# Patient Record
Sex: Female | Born: 1945
Health system: Southern US, Community
[De-identification: ages and names within clinical notes are randomized; demographics above are authoritative.]

## PROBLEM LIST (undated history)

## (undated) DIAGNOSIS — M1990 Unspecified osteoarthritis, unspecified site: Secondary | ICD-10-CM

## (undated) DIAGNOSIS — K219 Gastro-esophageal reflux disease without esophagitis: Secondary | ICD-10-CM

## (undated) DIAGNOSIS — R0989 Other specified symptoms and signs involving the circulatory and respiratory systems: Secondary | ICD-10-CM

## (undated) DIAGNOSIS — K7581 Nonalcoholic steatohepatitis (NASH): Secondary | ICD-10-CM

## (undated) DIAGNOSIS — E559 Vitamin D deficiency, unspecified: Secondary | ICD-10-CM

## (undated) DIAGNOSIS — R7303 Prediabetes: Secondary | ICD-10-CM

## (undated) DIAGNOSIS — T4145XA Adverse effect of unspecified anesthetic, initial encounter: Secondary | ICD-10-CM

## (undated) DIAGNOSIS — E785 Hyperlipidemia, unspecified: Secondary | ICD-10-CM

## (undated) DIAGNOSIS — T8859XA Other complications of anesthesia, initial encounter: Secondary | ICD-10-CM

## (undated) DIAGNOSIS — D649 Anemia, unspecified: Secondary | ICD-10-CM

## (undated) HISTORY — DX: Prediabetes: R73.03

## (undated) HISTORY — PX: OTHER SURGICAL HISTORY: SHX169

## (undated) HISTORY — DX: Gastro-esophageal reflux disease without esophagitis: K21.9

## (undated) HISTORY — DX: Other specified symptoms and signs involving the circulatory and respiratory systems: R09.89

## (undated) HISTORY — DX: Hyperlipidemia, unspecified: E78.5

## (undated) HISTORY — DX: Nonalcoholic steatohepatitis (NASH): K75.81

## (undated) HISTORY — DX: Vitamin D deficiency, unspecified: E55.9

## (undated) HISTORY — PX: ABDOMINAL HYSTERECTOMY: SHX81

---

## 1998-05-30 ENCOUNTER — Ambulatory Visit (HOSPITAL_COMMUNITY): Admission: RE | Admit: 1998-05-30 | Discharge: 1998-05-30 | Payer: Self-pay | Admitting: Psychiatry

## 1998-06-14 ENCOUNTER — Other Ambulatory Visit: Admission: RE | Admit: 1998-06-14 | Discharge: 1998-06-14 | Payer: Self-pay | Admitting: *Deleted

## 1999-01-20 ENCOUNTER — Encounter: Admission: RE | Admit: 1999-01-20 | Discharge: 1999-04-20 | Payer: Self-pay | Admitting: Psychiatry

## 1999-07-11 ENCOUNTER — Other Ambulatory Visit: Admission: RE | Admit: 1999-07-11 | Discharge: 1999-07-11 | Payer: Self-pay | Admitting: *Deleted

## 1999-11-03 ENCOUNTER — Encounter (INDEPENDENT_AMBULATORY_CARE_PROVIDER_SITE_OTHER): Payer: Self-pay | Admitting: Specialist

## 1999-11-03 ENCOUNTER — Other Ambulatory Visit: Admission: RE | Admit: 1999-11-03 | Discharge: 1999-11-03 | Payer: Self-pay | Admitting: *Deleted

## 2000-01-21 ENCOUNTER — Emergency Department (HOSPITAL_COMMUNITY): Admission: EM | Admit: 2000-01-21 | Discharge: 2000-01-21 | Payer: Self-pay | Admitting: Emergency Medicine

## 2000-03-04 ENCOUNTER — Encounter: Admission: RE | Admit: 2000-03-04 | Discharge: 2000-03-04 | Payer: Self-pay | Admitting: *Deleted

## 2000-03-04 ENCOUNTER — Encounter: Payer: Self-pay | Admitting: *Deleted

## 2000-03-07 ENCOUNTER — Encounter: Admission: RE | Admit: 2000-03-07 | Discharge: 2000-03-07 | Payer: Self-pay | Admitting: Orthopedic Surgery

## 2000-03-07 ENCOUNTER — Encounter: Payer: Self-pay | Admitting: Orthopedic Surgery

## 2000-07-24 ENCOUNTER — Other Ambulatory Visit: Admission: RE | Admit: 2000-07-24 | Discharge: 2000-07-24 | Payer: Self-pay | Admitting: *Deleted

## 2001-03-05 ENCOUNTER — Encounter: Payer: Self-pay | Admitting: *Deleted

## 2001-03-05 ENCOUNTER — Encounter: Admission: RE | Admit: 2001-03-05 | Discharge: 2001-03-05 | Payer: Self-pay | Admitting: *Deleted

## 2001-04-16 ENCOUNTER — Encounter: Admission: RE | Admit: 2001-04-16 | Discharge: 2001-04-16 | Payer: Self-pay | Admitting: Gastroenterology

## 2001-04-16 ENCOUNTER — Encounter: Payer: Self-pay | Admitting: Gastroenterology

## 2001-04-21 ENCOUNTER — Ambulatory Visit (HOSPITAL_COMMUNITY): Admission: RE | Admit: 2001-04-21 | Discharge: 2001-04-21 | Payer: Self-pay | Admitting: Gastroenterology

## 2002-03-09 ENCOUNTER — Encounter: Admission: RE | Admit: 2002-03-09 | Discharge: 2002-03-09 | Payer: Self-pay | Admitting: *Deleted

## 2002-03-09 ENCOUNTER — Encounter: Payer: Self-pay | Admitting: *Deleted

## 2002-03-11 ENCOUNTER — Other Ambulatory Visit: Admission: RE | Admit: 2002-03-11 | Discharge: 2002-03-11 | Payer: Self-pay | Admitting: *Deleted

## 2002-04-28 ENCOUNTER — Ambulatory Visit (HOSPITAL_COMMUNITY): Admission: RE | Admit: 2002-04-28 | Discharge: 2002-04-28 | Payer: Self-pay | Admitting: Internal Medicine

## 2002-04-28 ENCOUNTER — Encounter: Payer: Self-pay | Admitting: Internal Medicine

## 2002-05-07 ENCOUNTER — Ambulatory Visit (HOSPITAL_COMMUNITY): Admission: RE | Admit: 2002-05-07 | Discharge: 2002-05-07 | Payer: Self-pay | Admitting: Internal Medicine

## 2002-05-07 ENCOUNTER — Encounter: Payer: Self-pay | Admitting: Internal Medicine

## 2002-06-20 ENCOUNTER — Inpatient Hospital Stay (HOSPITAL_COMMUNITY): Admission: AD | Admit: 2002-06-20 | Discharge: 2002-06-20 | Payer: Self-pay | Admitting: *Deleted

## 2002-08-25 ENCOUNTER — Observation Stay (HOSPITAL_COMMUNITY): Admission: RE | Admit: 2002-08-25 | Discharge: 2002-08-26 | Payer: Self-pay | Admitting: *Deleted

## 2002-08-25 ENCOUNTER — Encounter (INDEPENDENT_AMBULATORY_CARE_PROVIDER_SITE_OTHER): Payer: Self-pay

## 2003-03-11 ENCOUNTER — Encounter: Payer: Self-pay | Admitting: *Deleted

## 2003-03-11 ENCOUNTER — Encounter: Admission: RE | Admit: 2003-03-11 | Discharge: 2003-03-11 | Payer: Self-pay | Admitting: *Deleted

## 2003-04-08 ENCOUNTER — Other Ambulatory Visit: Admission: RE | Admit: 2003-04-08 | Discharge: 2003-04-08 | Payer: Self-pay | Admitting: *Deleted

## 2004-03-13 ENCOUNTER — Encounter: Admission: RE | Admit: 2004-03-13 | Discharge: 2004-03-13 | Payer: Self-pay | Admitting: *Deleted

## 2004-05-23 ENCOUNTER — Inpatient Hospital Stay (HOSPITAL_COMMUNITY): Admission: EM | Admit: 2004-05-23 | Discharge: 2004-05-24 | Payer: Self-pay | Admitting: Emergency Medicine

## 2004-06-12 ENCOUNTER — Other Ambulatory Visit: Admission: RE | Admit: 2004-06-12 | Discharge: 2004-06-12 | Payer: Self-pay | Admitting: *Deleted

## 2005-04-06 ENCOUNTER — Ambulatory Visit (HOSPITAL_COMMUNITY): Admission: RE | Admit: 2005-04-06 | Discharge: 2005-04-06 | Payer: Self-pay | Admitting: Obstetrics and Gynecology

## 2005-04-10 ENCOUNTER — Ambulatory Visit (HOSPITAL_COMMUNITY): Admission: RE | Admit: 2005-04-10 | Discharge: 2005-04-10 | Payer: Self-pay | Admitting: Obstetrics and Gynecology

## 2005-04-13 ENCOUNTER — Encounter: Admission: RE | Admit: 2005-04-13 | Discharge: 2005-04-13 | Payer: Self-pay | Admitting: *Deleted

## 2005-07-24 ENCOUNTER — Other Ambulatory Visit: Admission: RE | Admit: 2005-07-24 | Discharge: 2005-07-24 | Payer: Self-pay | Admitting: *Deleted

## 2005-11-16 ENCOUNTER — Emergency Department (HOSPITAL_COMMUNITY): Admission: EM | Admit: 2005-11-16 | Discharge: 2005-11-17 | Payer: Self-pay | Admitting: Emergency Medicine

## 2005-12-17 HISTORY — PX: EYE SURGERY: SHX253

## 2006-04-15 ENCOUNTER — Encounter: Admission: RE | Admit: 2006-04-15 | Discharge: 2006-04-15 | Payer: Self-pay | Admitting: *Deleted

## 2006-08-28 ENCOUNTER — Other Ambulatory Visit: Admission: RE | Admit: 2006-08-28 | Discharge: 2006-08-28 | Payer: Self-pay | Admitting: *Deleted

## 2007-04-30 ENCOUNTER — Encounter: Admission: RE | Admit: 2007-04-30 | Discharge: 2007-04-30 | Payer: Self-pay | Admitting: *Deleted

## 2007-12-09 ENCOUNTER — Other Ambulatory Visit: Admission: RE | Admit: 2007-12-09 | Discharge: 2007-12-09 | Payer: Self-pay | Admitting: *Deleted

## 2008-05-03 ENCOUNTER — Encounter: Admission: RE | Admit: 2008-05-03 | Discharge: 2008-05-03 | Payer: Self-pay | Admitting: Gynecology

## 2008-09-27 ENCOUNTER — Ambulatory Visit (HOSPITAL_COMMUNITY): Admission: RE | Admit: 2008-09-27 | Discharge: 2008-09-27 | Payer: Self-pay | Admitting: Gynecology

## 2009-05-23 ENCOUNTER — Encounter: Admission: RE | Admit: 2009-05-23 | Discharge: 2009-05-23 | Payer: Self-pay | Admitting: Gynecology

## 2010-01-03 ENCOUNTER — Ambulatory Visit (HOSPITAL_COMMUNITY): Admission: RE | Admit: 2010-01-03 | Discharge: 2010-01-03 | Payer: Self-pay | Admitting: Internal Medicine

## 2010-05-24 ENCOUNTER — Encounter: Admission: RE | Admit: 2010-05-24 | Discharge: 2010-05-24 | Payer: Self-pay | Admitting: Gynecology

## 2010-05-25 ENCOUNTER — Ambulatory Visit (HOSPITAL_COMMUNITY): Admission: RE | Admit: 2010-05-25 | Discharge: 2010-05-25 | Payer: Self-pay | Admitting: Internal Medicine

## 2011-04-10 ENCOUNTER — Other Ambulatory Visit: Payer: Self-pay | Admitting: Orthopedic Surgery

## 2011-04-10 DIAGNOSIS — M25529 Pain in unspecified elbow: Secondary | ICD-10-CM

## 2011-04-16 ENCOUNTER — Ambulatory Visit
Admission: RE | Admit: 2011-04-16 | Discharge: 2011-04-16 | Disposition: A | Discharge status: Home / Self Care | Payer: BC Managed Care – PPO | Source: Ambulatory Visit | Attending: Orthopedic Surgery | Admitting: Orthopedic Surgery

## 2011-04-16 DIAGNOSIS — M25529 Pain in unspecified elbow: Secondary | ICD-10-CM

## 2011-05-04 NOTE — Op Note (Signed)
NAME:  Anita Blake, Anita Blake                 ACCOUNT NO.:  [id number]   MEDICAL RECORD NO.:  [id number]          PATIENT TYPE:  AMB   LOCATION:  [place]                           FACILITY:  [facility]   PHYSICIAN:  Michelle L. Grewal, M.D.DATE OF BIRTH:  Aug 03, 1946   DATE OF PROCEDURE:  04/06/2005  DATE OF DISCHARGE:                                 OPERATIVE REPORT   PREOPERATIVE DIAGNOSIS:  Left lower quadrant pain.   POSTOPERATIVE DIAGNOSIS:  Extensive pelvic adhesions.   PROCEDURE:  Diagnostic laparoscopy and lysis of adhesions.   SURGEON:  Michelle L. Helane Rima, M.D.   INTRAOPERATIVE CONSULTATION:  Edsel Petrin. Dalbert Batman, M.D.   ANESTHESIA:  General.   ESTIMATED BLOOD LOSS:  Minimal.   COMPLICATIONS:  None.   DESCRIPTION OF PROCEDURE:  The patient was taken to the operating room and  intubated in the standard fashion.  She was prepped and draped, and an in-  and-out catheter was used to empty the bladder.  A sponge stick was placed  in the vagina.   Attention was turned to the abdomen.  A small infraumbilical incision was  made, and the Veress needle was inserted with one attempt.  Pneumoperitoneum  was achieved to a maximum pressure of 15 mmHg.  The 11-mm trocar was  inserted without difficulty.  The laparoscope was introduced through the  trocar sheath, and the patient was placed in Trendelenburg position.  The  upper abdomen was unremarkable.  We then placed a secondary 5-mm suprapubic  trocar under direct visualization.  I did a thoroughly exploration of the  pelvis and abdomen.  First off, she had very extensive adhesions involving  the large and small bowel that covered basically the entire left pelvic  sidewall and vaginal cuff.  There was a significant amount of mesenteric fat  which also interfered with our visualization.  There was one band which was  fairly dense of small bowel adherent to the vagina cuff which was then  freed, with hemostasis.  This did help with some mobility.  We  then did a  thorough exploration and tried to look for ovaries; however, I was unable to  locate any ovarian tissue anywhere in the pelvis.  I did visualize the  ureter pretty well on the right side after lifting up the two loops of large  bowel that were stuck to the pelvic sidewall.  These adhesions were fairly  dense, and because of the mesenteric fat, it was very hard to visualize if  there was bowel on one side.  At this point, I paged Dr. Fanny Skates and  had a telephone consult.  I described the findings in the pelvis to Fanny Skates.  His recommendation was that because there is the possibility the  patient could have undiagnosed diverticulitis, to not lyse adhesions at this  point.  He recommended for her to have a GI consult with a colonoscopy.  If  that was negative, she could come back at another point when she had a  complete bowel prep for lysis of adhesions.  I do think that these adhesions  could be contributing to her left-sided pain because they are in the same  distribution of where she describes clinically the pain is.  At this point,  I also did not identify anything that looked like uterine tissue.  I will  obtain her operative note from her hysterectomy from 2003 because the  patient had told me she did not have her ovaries removed, but I am unable to  find anything that looks like ovary.  The pneumoperitoneum was released.  The trocars were removed.  A 3-0 Vicryl was used to close the incisions.  Local was infiltrated at each incision site.  All sponge, needle, and  instrument counts were correct x2.   The patient went to the recovery room in stable condition.      MLG/MEDQ  D:  04/06/2005  T:  04/06/2005  Job:  [id number]

## 2011-05-04 NOTE — Op Note (Signed)
NAME:  Anita Blake, Anita Blake                           ACCOUNT NO.:  [id number]   MEDICAL RECORD NO.:  [id number]                   PATIENT TYPE:  INP   LOCATION:  7915                                 FACILITY:  [facility]   PHYSICIAN:  Rodney A. Mortenson, M.D.           DATE OF BIRTH:  [birth year]   DATE OF PROCEDURE:  05/22/2004  DATE OF DISCHARGE:                                 OPERATIVE REPORT   PREOPERATIVE DIAGNOSIS:  Dislocation, right elbow, with fracture of the  radial head and complete avulsion of the lateral collateral ligament, right  elbow, and avulsion.   POSTOPERATIVE DIAGNOSIS:  Dislocation, right elbow, with fracture of the  radial head and complete avulsion of the lateral collateral ligament, right  elbow, and avulsion.   OPERATION:  Arthrotomy, removal of multiple bony loose fragments.  Reconstruction, lateral collateral ligament, right elbow.   ANESTHESIA:  General.   SURGEON:  Jeanie Cooks, M.D.   ASSISTANT:  Thomasenia Bottoms, P.A.-C.   PROCEDURE:  Patient was placed on the operative table in the supine position  with the pneumatic tourniquet about the right upper arm.  The right upper  extremity was prepped with Duraprep and draped in the usual manner. The arm  was exsanguinated with Esmarch.  The tourniquet was elevated.  An incision  was started proximally to the lateral epicondyle and carried distally over  the radial head to the subcutaneous border of the ulna.  The skin edges were  retracted.  The bleeders were coagulated.  Blunt dissection is deep to the  subcutaneous tissue.  The fascia had been split open, and on dissection with  a blunt scissor, a great deal of blood came out of the joint.  Obviously,  the joint had been disrupted.  This disrupted plane was then dissected  bluntly with scissors.  No sharp instruments were used.  Once this was  opened, it was seen that the lateral collateral ligament was totally avulsed  off the lateral epicondyle.  This gave  excellent access to the joint itself.  There are multiple bony fragments found anterior to the humerus and some  posterior.  This all removed.  Took a great deal of time spent removing all  bony fragments.  The elbow was unstable.  Excellent access was achieved.  The area was irrigated with copiously with saline solution and drained.  All  debris was removed.  There was about 25-30% of the radial head missing, and  this was basically, four fragments could not be reconstructed.  With the  elbow reduced, the radial head had full pronation and supination without  hindrance or obstruction.  It was elected to save the radial head at this  point and reconstruct the lateral collateral ligament in hopes that good  range of motion and function would be achieved without replacing the radial  head or resecting the radial head.  The posterior aspect of the capitulum  showed an area where there was some loss of articular cartilage.  The  articulation of the distal humerus to the _________ with a notch in the  ulna.  The posterior fossa appeared normal with no loose bodies or anemia in  this area.  A drill hole was placed through the lateral epicondyle.  A Mitek  anchor was inserted, and the lateral collateral ligament was reattached to  its anatomic footprint and sutured on snugly.  Excellent reconstruction of  the lateral collateral ligament was tapered.  Once this was done, the radial  head was quite stable in the capitulum.  Ethibond 2-0 was also used to close  the capsule in this area.  Excellent capsular closure with excellent  stability was achieved.  The lateral collateral ligament as noted above is  reconstructed in anatomic position.  The subcutaneous tissues were then  closed with 2-0 Vicryl, and staples were used to close the skin.  Sterile  dressing and a posterior plaster splint with the elbow flexed about 80  degrees was applied.  The tourniquet was dropped prior to closure.  Excellent  reconstruction was achieved with good stability achieved.   DRAINS:  None.   COMPLICATIONS:  None.                                               Rodney A. Alphonzo Cruise, M.D.    RAM/MEDQ  D:  05/23/2004  T:  05/23/2004  Job:  [id number]

## 2011-05-04 NOTE — Op Note (Signed)
Anita Blake, Anita Blake                          ACCOUNT NO.:  [id number]   MEDICAL RECORD NO.:  [id number]                   PATIENT TYPE:  OBV   LOCATION:  9476                                 FACILITY:  [facility]   PHYSICIAN:  Doran Heater. Fore, M.D.                DATE OF BIRTH:  November 07, 1946   DATE OF PROCEDURE:  08/25/2002  DATE OF DISCHARGE:                                 OPERATIVE REPORT   PREOPERATIVE DIAGNOSES:  1. Enlarging leiomyomata uteri.  2. Pelvic pain.  3. Abnormal uterine bleeding.  4. Anemia secondary to above.   POSTOPERATIVE DIAGNOSES:  1. Enlarging leiomyomata uteri.  2. Pelvic pain.  3. Abnormal uterine bleeding.  4. Anemia secondary to above, pending pathology.   OPERATIVE PROCEDURE:  Abdominal cervical hysterectomy.   SURGEON:  Doran Heater. Warnell Forester, M.D.   ASSISTANT:  Calvert Cantor, M.D.   ANESTHESIA:  General orotracheal.   INDICATIONS:  The patient is a 65 year old with the above noted problems who  is counseled as to the need for surgery and the type of surgery to be  performed. She was fully counseled as to the nature of the procedure and the  risks involved to include risk of anesthesia, injury to bowel, bladder,  blood vessels, ureters, postoperative hemorrhage, infection, recuperation  and possibility of hormone replacement should her ovaries be removed. She  fully understands all these considerations and wishes to proceed on  August 25, 2002.   OPERATIVE FINDINGS:  On attempt to effect [place] into the uterus, it was noted  that the uterus would not descend and that the cervix was quite high in the  vagina without any relaxation when the patient was under general anesthesia.  Accordingly, an abdominal procedure was performed. On entry into the  abdomen, the uterus was approximately 10-[redacted] weeks gestational size with  subserous myomata present. The ovaries appeared normal as did the tubes. The  upper abdominal viscera were normal to palpation.   DESCRIPTION OF PROCEDURE:  With the patient under general anesthesia,  prepared and draped in the usual sterile fashion, a speculum was placed in  the vagina. An attempt was made to effect descent of the uterus which was  unsuccessful. Accordingly, the patient was catheterized with a Foley  catheter and reprepared and redraped for an abdominal procedure.   A lower abdominal transverse incision was made through a previous surgical  scar and carried into the peritoneal cavity without difficulty. A self-  retaining retractor was placed and the bowel was packed off. The uterus was  elevated using Kelly clamps over the tubes and the round ligaments and  uterine ovarian anastomoses bilaterally. The round ligaments were then  transected using Bovie electrocoagulation with development of a bladder flap  anteriorly and skeletonization of the uterine vessels. The retroperitoneal  space was likewise entered and the infundibulopelvic ligaments were  identified with the ureters well below the plane  of dissection. Because of  the normal appearance of the ovaries and the patient's desire to retain the  ovaries should they appear normal, Heaney clamps were placed proximally to  the ovary bilaterally, The uterine ovarian anastomosis was then transected  and doubly ligated with #1 chromic catgut. The uterine vessels were  skeletonized bilaterally, clamped, cut, and suture ligated with #1 chromic  catgut. Straight Heaney clamps were used to clamp the remaining portions of  the cardinal ligaments bilaterally; these structures were then cut free and  suture ligated with #1 chromic catgut. The uterine fundus was then excised  using Bovie electrocoagulation. The cervix was rendered hemostatic and the  endocervical tissue was destroyed using Bovie electrocoagulation.  Interrupted figure of eight sutures were placed across the cervix for  hemostasis and reapproximation of the cervical stump. Small bleeders were   rendered hemostatic with Bovie electrocoagulation. After noting that  hemostasis was maintained in the surgical site, the area was  reperitonealized with interrupted suture of #1 chromic catgut. The ovarian  pedicles were then suspended from the round ligaments bilaterally with  interrupted sutures of #1 chromic catgut. Again after noting that hemostasis  was maintained and that the sponge and instruments were correct, the  peritoneum was closed with continuous suture of #0 Vicryl. The fascia was  closed with two sutures of #0 Vicryl which were brought from the lateral  aspects and tied separately in the midline. The subcutaneous fat was  reapproximated with interrupted sutures of #1 chromic catgut. The skin was  closed with a subcuticular suture of 3-0 plain catgut. The estimated blood  loss was 250 mL. The patient was taken to the recovery room in good  condition with clear urine in the Foley catheter tubing. She will be placed  on 23-hour observation following surgery.                                                 Doran Heater. Warnell Forester, M.D.    SRF/MEDQ  D:  08/25/2002  T:  08/25/2002  Job:  [id number]   cc:   Calvert Cantor, M.D.  [address] [place]  [place] [address]  Fax: [phone]

## 2011-05-04 NOTE — Discharge Summary (Signed)
   NAME:  Anita Blake, Anita Blake                           ACCOUNT NO.:  [id number]   MEDICAL RECORD NO.:  [id number]                   PATIENT TYPE:  OBV   LOCATION:  5456                                 FACILITY:  [facility]   PHYSICIAN:  Doran Heater. Fore, M.D.                DATE OF BIRTH:  [birth year]   DATE OF ADMISSION:  08/25/2002  DATE OF DISCHARGE:  08/26/2002                                 DISCHARGE SUMMARY   HISTORY:  The patient is a 65 year old with status post myomectomy with  increasing size of uterus, abnormal uterine bleeding, and anemia for  hysterectomy.  The remainder of her history and physical are as previously  dictated.   LABORATORY DATA:  Preoperative hemoglobin 11.5.  Electrocardiogram was  normal.   HOSPITAL COURSE:  The patient was taken to the operating room on September 9  at which time an abdominal supracervical hysterectomy was performed.  The  patient did well postoperatively.  Diet and ambulation were progressed over  the evening of September 9 and early morning of September 10.  On the  morning of September 10 she was afebrile and experiencing no problems except  for pain which was relieved by medications.  It was felt that she could be  discharged at this time.   FINAL DIAGNOSES:  Uterine fibroids, enlarging, pelvic pain, abnormal uterine  bleeding, anemia secondary to above.   OPERATION:  Abdominal supracervical hysterectomy.   PATHOLOGY:  Report unavailable at the time of dictation.   DISPOSITION:  Discharged home to return to the office in two weeks for  follow-up.  She was fully ambulatory, on a regular diet, and in good  condition at the time of discharge.  She was given prescriptions for  Dilaudid or generic 2 mg number 30 to be taken one to two q.4h. p.r.n. pain  and doxycycline 100 mg number 12 to be taken one b.i.d.  She was also  advised to use ibuprofen 400 mg t.i.d. for approximately one week.  She will  call for any problems.                                   Doran Heater. Warnell Forester, M.D.    SRF/MEDQ  D:  08/26/2002  T:  08/26/2002  Job:  [id number]   cc:   Mayme Genta, M.D.

## 2011-05-04 NOTE — H&P (Signed)
NAME:  Anita Blake, Anita Blake                           ACCOUNT NO.:  [id number]   MEDICAL RECORD NO.:  [id number]                   PATIENT TYPE:  MAT   LOCATION:  MATC                                 FACILITY:  [facility]   PHYSICIAN:  Doran Heater. Fore, M.D.                DATE OF BIRTH:  [birth year]   DATE OF ADMISSION:  08/25/2002  DATE OF DISCHARGE:  06/20/2002                                HISTORY & PHYSICAL   CHIEF COMPLAINT:  Abnormal bleeding, uterine enlargement.   HISTORY OF PRESENT ILLNESS:  The patient is a 65 year old who has been  followed in our office over several years.  She has had increasing uterine  size over the past several years to the point where the uterus is now  approximately 12 to [redacted] weeks gestational size and suddenly irregular.  The  patient has also experienced abnormal bleeding with a drop in her hemoglobin  such that a recent hemoglobin approximately a month ago was 10.9.  She has  been maintained on extra iron with some improvement.  She underwent  hysteroscopy and D&C in mid 6/03 with benign findings.  Pap smear was normal  in 3/03.  She is admitted at this time for hysterectomy, possible bilateral  salpingo-oophorectomy.  She has been fully counseled as to the nature of  these procedures and the risks involved which include risk of anesthesia,  injury to bowel, bladder, blood vessels, ureters, postoperative hemorrhage,  infection, and recuperation.  She has also been counseled as to hormone  therapy following surgery.  She fully understands all of these  considerations, and wishes to proceed on 08/25/02.   PAST MEDICAL HISTORY:  1. Previous D&C and laparoscopy with ablation of a right ovary cyst and     lysis of adhesions in 6/98.  2. D&C in 11/00.  3. Most recent D&C in 6/03.  All of these pathology reports have shown only benign changes.   MEDICATIONS:  She takes no medications regularly except for analgesics.  She  has been on progesterone suppression for  improvement of bleeding.   ALLERGIES:  No known drug allergies.   FAMILY HISTORY:  Noncontributory.   REVIEW OF SYMPTOMS:  HEENT:  Wears glasses.  CARDIORESPIRATORY:  Negative.  GASTROINTESTINAL:  Negative.  GENITOURINARY:  As in present illness.  NEUROMUSCULAR:  Negative.   PHYSICAL EXAMINATION:  VITAL SIGNS:  Height 5 feet 3.5 inches, weight 233  pounds, blood pressure 142/78, pulse 72, respiratory rate 18.  GENERAL:  A well-developed black female in no acute distress.  HEENT:  Within normal limits.  NECK:  Supple without masses, adenopathy, or bruits.  HEART:  Regular rate and rhythm without murmurs.  LUNGS:  Clear to auscultation and percussion.  BREASTS:  Sitting and laying without mass.  Axilla negative.  ABDOMEN:  Soft, without mass or tenderness.  PELVIC:  External genitalia, Bartholin's, urethra, and Skene's glands within  normal limits.  Cervix slightly inflamed.  Uterus is mid position  approximately 12+ weeks gestational size, slightly irregular and somewhat  tender.  There are no palpable adnexal masses.  Anterior and posterior cul-  de-sac examination and rectovaginal examination are confirmatory.  EXTREMITIES:  Within normal limits.  CENTRAL NERVOUS SYSTEM:  Grossly intact.  SKIN:  Without suspicious lesions.   IMPRESSION:  1. Abnormal uterine bleeding.  2. Uterine enlargement.   DISPOSITION:  As noted above.                                                Doran Heater. Warnell Forester, M.D.    SRF/MEDQ  D:  08/20/2002  T:  08/20/2002  Job:  [id number]

## 2011-05-04 NOTE — Discharge Summary (Signed)
NAME:  Anita Blake, Anita Blake                           ACCOUNT NO.:  [id number]   MEDICAL RECORD NO.:  [id number]                   PATIENT TYPE:  INP   LOCATION:  8527                                 FACILITY:  [facility]   PHYSICIAN:  Rodney A. Alphonzo Cruise, M.D.           DATE OF BIRTH:  1946-07-05   DATE OF ADMISSION:  05/22/2004  DATE OF DISCHARGE:  05/24/2004                                 DISCHARGE SUMMARY   ADMISSION DIAGNOSIS:  Fractured radial head, right elbow.  Dislocated same, __________ elbow, with floating bony fragment in elbow  joint.   DISCHARGE DIAGNOSES:  1. Right elbow closed reduction with open fracture clean-out of bony     fragments.  2. Reconstruction of the lateral collateral ligament and arthrotomy.   HISTORY OF PRESENT ILLNESS:  The patient is a 65 year old female who  presents with right elbow pain after falling while trying to catch her dog.  The patient had significant discomfort in her left elbow.  She was  __________ emergency room where x-rays show a fracture dislocation of the  right elbow.  The patient was admitted for arthrotomy and bony fragment  removal and repair of right elbow.   CONSULTS:  None.   HOSPITAL COURSE:  On May 23, 2004, the patient was admitted to [facility] to the care of Dr. Gildardo Cranker.  The patient was evaluated in  the emergency room and found to have a fracture dislocation of the right  elbow, fracture of the radial head, avulsion of the LCL and bony fragments  within the joint.  The patient was evaluated, admitted and taken to the OR  for removal of bony fragments and arthrotomy and evaluation of the elbow  joint and reconstruction of the LCL under general anesthesia.  The patient tolerated this procedure well.  She was controlled with pain  medicine, both IV and oral, but she tolerated her pain well with just p.o.  pain medicines.  The patient had no postoperative untoward events.  Vital  signs remained stable.  Her  arm remained motor and vascularly intact and she  was discharged to home on postoperative day #1 with  a one-week followup  with Dr. Alphonzo Cruise.   LABORATORY DATA:  CBC on June 7 showed WBC 10.4, hemoglobin 12.6, hematocrit  38.2, platelets 267.  Routine chemistries showed sodium 138, potassium 4.1,  glucose 112, BUN 18, creatinine 0.7.   DISCHARGE MEDICATIONS:  1. The patient is to resume routine home medications  2. Vicodin 5 mg one or two tablets every four to six hours for pain if     needed.  3. Pain management with the Vicodin as noted above, ice and elevation.  4. Keep arm in sling.   ACTIVITY:  The patient have normal activities with her arm in sling at all  times.   DIET:  No restrictions.   WOUND CARE:  The patient is to keep  splint clean and dry.  If any concern or  problems with the right elbow, she is to call Dr. Josefa Half office for  advice.   FOLLOW UP:  The patient is to follow up with Dr. Alphonzo Cruise in his office in  one week.  Please call [phone] for followup appointment.   CONDITION ON DISCHARGE:  Improved and good.     Evert Kohl, P.A.                      Rodney A. Alphonzo Cruise, M.D.    RWK/MEDQ  D:  06/08/2004  T:  06/09/2004  Job:  [id number]

## 2011-05-15 ENCOUNTER — Other Ambulatory Visit: Payer: Self-pay | Admitting: Gynecology

## 2011-05-15 DIAGNOSIS — Z1231 Encounter for screening mammogram for malignant neoplasm of breast: Secondary | ICD-10-CM

## 2011-05-28 ENCOUNTER — Ambulatory Visit
Admission: RE | Admit: 2011-05-28 | Discharge: 2011-05-28 | Disposition: A | Discharge status: Home / Self Care | Payer: BC Managed Care – PPO | Source: Ambulatory Visit | Attending: Gynecology | Admitting: Gynecology

## 2011-05-28 ENCOUNTER — Ambulatory Visit: Payer: BC Managed Care – PPO

## 2011-05-28 DIAGNOSIS — Z1231 Encounter for screening mammogram for malignant neoplasm of breast: Secondary | ICD-10-CM

## 2011-10-12 ENCOUNTER — Other Ambulatory Visit (HOSPITAL_COMMUNITY): Payer: Self-pay | Admitting: Internal Medicine

## 2011-10-12 DIAGNOSIS — R1011 Right upper quadrant pain: Secondary | ICD-10-CM

## 2011-10-16 ENCOUNTER — Ambulatory Visit (HOSPITAL_COMMUNITY)
Admission: RE | Admit: 2011-10-16 | Discharge: 2011-10-16 | Disposition: A | Discharge status: Home / Self Care | Payer: BC Managed Care – PPO | Source: Ambulatory Visit | Attending: Internal Medicine | Admitting: Internal Medicine

## 2011-10-16 DIAGNOSIS — R1011 Right upper quadrant pain: Secondary | ICD-10-CM

## 2011-10-16 DIAGNOSIS — R7989 Other specified abnormal findings of blood chemistry: Secondary | ICD-10-CM | POA: Insufficient documentation

## 2011-10-23 ENCOUNTER — Other Ambulatory Visit: Payer: Self-pay

## 2011-10-25 DIAGNOSIS — H35349 Macular cyst, hole, or pseudohole, unspecified eye: Secondary | ICD-10-CM | POA: Insufficient documentation

## 2012-10-30 DIAGNOSIS — H269 Unspecified cataract: Secondary | ICD-10-CM | POA: Insufficient documentation

## 2013-09-29 ENCOUNTER — Encounter (HOSPITAL_COMMUNITY): Payer: Self-pay | Admitting: Pharmacy Technician

## 2013-09-30 ENCOUNTER — Other Ambulatory Visit (HOSPITAL_COMMUNITY): Payer: Self-pay | Admitting: Orthopedic Surgery

## 2013-09-30 NOTE — Progress Notes (Signed)
ekg 10-14-2012 dr Melford Aase on chart

## 2013-09-30 NOTE — Patient Instructions (Addendum)
[place]  09/30/2013   Your procedure is scheduled on: 10-06-2013  Report to [facility] at 725  AM.  Call this number if you have problems the morning of surgery [phone]   Remember:   Do not eat food or drink liquids :After Midnight.     Take these medicines the morning of surgery with A SIP OF WATER: no meds to take                                [place] may not have any metal on your body including hair pins and piercings  Do not wear jewelry, make-up.  Do not wear lotions, powders, or perfumes. You may wear deodorant.   Men may shave face and neck.  Do not bring valuables to the hospital. [place].  Contacts, dentures or bridgework may not be worn into surgery.  Leave suitcase in the car. After surgery it may be brought to your room.  For patients admitted to the hospital, checkout time is 11:00 AM the day of discharge.   Patients discharged the day of surgery will not be allowed to drive home.  Name and phone number of your driver:  Special Instructions: N/A   Please read over the following fact sheets that you were given: mrsa information, blood fact sheet, incentive spirometer sheet  Call Zelphia Cairo RN pre op nurse if needed [phone]    [place].  PATIENT SIGNATURE___________________________________________  NURSE SIGNATURE_____________________________________________  Pt's husband took message - surgery time changed to 9:15 am and Anita Blake should arrive to Short Stay by 6:30 am  - remind her that she is not to eat or drink anything after midnight night before surgery.

## 2013-10-01 ENCOUNTER — Encounter (INDEPENDENT_AMBULATORY_CARE_PROVIDER_SITE_OTHER): Payer: Self-pay

## 2013-10-01 ENCOUNTER — Encounter (HOSPITAL_COMMUNITY): Payer: Self-pay

## 2013-10-01 ENCOUNTER — Encounter (HOSPITAL_COMMUNITY)
Admission: RE | Admit: 2013-10-01 | Discharge: 2013-10-01 | Disposition: A | Discharge status: Home / Self Care | Payer: Medicare PPO | Source: Ambulatory Visit | Attending: Orthopedic Surgery | Admitting: Orthopedic Surgery

## 2013-10-01 ENCOUNTER — Encounter (HOSPITAL_COMMUNITY): Payer: Self-pay | Admitting: Pharmacy Technician

## 2013-10-01 DIAGNOSIS — Z01812 Encounter for preprocedural laboratory examination: Secondary | ICD-10-CM | POA: Insufficient documentation

## 2013-10-01 DIAGNOSIS — Z01818 Encounter for other preprocedural examination: Secondary | ICD-10-CM | POA: Insufficient documentation

## 2013-10-01 HISTORY — DX: Anemia, unspecified: D64.9

## 2013-10-01 HISTORY — DX: Adverse effect of unspecified anesthetic, initial encounter: T41.45XA

## 2013-10-01 HISTORY — DX: Other complications of anesthesia, initial encounter: T88.59XA

## 2013-10-01 HISTORY — DX: Unspecified osteoarthritis, unspecified site: M19.90

## 2013-10-01 LAB — CBC
Platelets: 292 10*3/uL (ref 150–400)
RBC: 4.85 MIL/uL (ref 3.87–5.11)
WBC: 6.9 10*3/uL (ref 4.0–10.5)

## 2013-10-01 LAB — BASIC METABOLIC PANEL
Calcium: 9.6 mg/dL (ref 8.4–10.5)
GFR calc Af Amer: 79 mL/min — ABNORMAL LOW (ref >90)
GFR calc non Af Amer: 68 mL/min — ABNORMAL LOW (ref >90)
Sodium: 138 mEq/L (ref 135–145)

## 2013-10-01 LAB — PROTIME-INR: Prothrombin Time: 13 seconds (ref 11.6–15.2)

## 2013-10-01 LAB — URINALYSIS, ROUTINE W REFLEX MICROSCOPIC
Bilirubin Urine: NEGATIVE
Hgb urine dipstick: NEGATIVE
Specific Gravity, Urine: 1.027 (ref 1.005–1.030)
Urobilinogen, UA: 0.2 mg/dL (ref 0.0–1.0)

## 2013-10-01 LAB — SURGICAL PCR SCREEN: Staphylococcus aureus: NEGATIVE

## 2013-10-01 LAB — APTT: aPTT: 30 seconds (ref 24–37)

## 2013-10-02 NOTE — Progress Notes (Signed)
Pt called at 0950 10/02/13 about surgery time change. Voicemail left on home phone. Will continue to try to reach patient.

## 2013-10-04 NOTE — H&P (Signed)
TOTAL HIP ADMISSION H&P  Patient is admitted for left total hip arthroplasty, anterior approach.  Subjective:  Chief Complaint:   Left hip OA / pain  HPI: Anita Blake, 67 y.o. female, has a history of pain and functional disability in the left hip(s) due to trauma and patient has failed non-surgical conservative treatments for greater than 12 weeks to include NSAID's and/or analgesics, corticosteriod injections, use of assistive devices and activity modification.  Onset of symptoms was gradual starting >10 years ago with gradually worsening course since that time.The patient noted no past surgery on the left hip(s).  Patient currently rates pain in the left hip at 9 out of 10 with activity. Patient has night pain, worsening of pain with activity and weight bearing, trendelenberg gait, pain that interfers with activities of daily living and pain with passive range of motion. Patient has evidence of periarticular osteophytes and joint space narrowing by imaging studies. This condition presents safety issues increasing the risk of falls. There is no current active infection.  Risks, benefits and expectations were discussed with the patient. Patient understand the risks, benefits and expectations and wishes to proceed with surgery.   D/C Plans:   Home with HHPT/SNF  Post-op Meds:   No Rx given  Tranexamic Acid:   To be given  Decadron:    To be given  FYI:    ASA post-op   Past Medical History  Diagnosis Date  . Arthritis   . Anemia yrs ago  . Complication of anesthesia     slow to wake up    Past Surgical History  Procedure Laterality Date  . Abdominal hysterectomy    . Eye surgery Left 2007    macular hole in retina  . Cataract surgery Left   . Right arm elbow surgery      ligament repair  . Index finger surgery Right     No prescriptions prior to admission   Allergies  Allergen Reactions  . Latex Rash    History  Substance Use Topics  . Smoking status: Never Smoker   .  Smokeless tobacco: Never Used  . Alcohol Use: No    No family history on file.   Review of Systems  Constitutional: Negative.   HENT: Negative.   Eyes: Negative.   Respiratory: Negative.   Cardiovascular: Negative.   Gastrointestinal: Negative.   Genitourinary: Negative.   Musculoskeletal: Positive for joint pain.  Skin: Negative.   Neurological: Negative.   Endo/Heme/Allergies: Negative.   Psychiatric/Behavioral: Negative.     Objective:  Physical Exam  Constitutional: She is oriented to person, place, and time. She appears well-developed and well-nourished.  HENT:  Head: Normocephalic and atraumatic.  Mouth/Throat: Oropharynx is clear and moist.  Eyes: Pupils are equal, round, and reactive to light.  Neck: Neck supple. No JVD present. No tracheal deviation present. No thyromegaly present.  Cardiovascular: Normal rate, regular rhythm, normal heart sounds and intact distal pulses.   Respiratory: Effort normal and breath sounds normal. No stridor. No respiratory distress. She has no wheezes.  GI: Soft. There is no tenderness. There is no guarding.  Musculoskeletal:       Left hip: She exhibits decreased range of motion, decreased strength, tenderness and bony tenderness. She exhibits no swelling, no deformity and no laceration.  Lymphadenopathy:    She has no cervical adenopathy.  Neurological: She is alert and oriented to person, place, and time.  Skin: Skin is warm and dry.  Psychiatric: She has a normal  mood and affect.     Imaging Review Plain radiographs demonstrate severe degenerative joint disease of the left hip(s). The bone quality appears to be good for age and reported activity level.  Assessment/Plan:  End stage arthritis, left hip(s)  The patient history, physical examination, clinical judgement of the provider and imaging studies are consistent with end stage degenerative joint disease of the left hip(s) and total hip arthroplasty is deemed medically  necessary. The treatment options including medical management, injection therapy, arthroscopy and arthroplasty were discussed at length. The risks and benefits of total hip arthroplasty were presented and reviewed. The risks due to aseptic loosening, infection, stiffness, dislocation/subluxation,  thromboembolic complications and other imponderables were discussed.  The patient acknowledged the explanation, agreed to proceed with the plan and consent was signed. Patient is being admitted for inpatient treatment for surgery, pain control, PT, OT, prophylactic antibiotics, VTE prophylaxis, progressive ambulation and ADL's and discharge planning.The patient is planning to be discharged to skilled nursing facility vs home.     [place] P[place] Damaria Stofko   PAC  10/04/2013, 8:19 PM

## 2013-10-05 NOTE — Progress Notes (Signed)
Spoke with patient- informed her to arrive 0530 AM, NPO after midnight

## 2013-10-05 NOTE — Progress Notes (Signed)
Patient informed to be in short stay at 0615am Tuesday-  Verbalizes understanding

## 2013-10-06 ENCOUNTER — Inpatient Hospital Stay (HOSPITAL_COMMUNITY): Payer: Medicare PPO | Admitting: Anesthesiology

## 2013-10-06 ENCOUNTER — Inpatient Hospital Stay (HOSPITAL_COMMUNITY): Payer: Medicare PPO

## 2013-10-06 ENCOUNTER — Encounter (HOSPITAL_COMMUNITY): Payer: Medicare PPO | Admitting: Anesthesiology

## 2013-10-06 ENCOUNTER — Encounter (HOSPITAL_COMMUNITY): Payer: Self-pay | Admitting: *Deleted

## 2013-10-06 ENCOUNTER — Encounter (HOSPITAL_COMMUNITY)
Admission: RE | Disposition: A | Discharge status: Home Health | Payer: Self-pay | Source: Ambulatory Visit | Attending: Orthopedic Surgery

## 2013-10-06 ENCOUNTER — Inpatient Hospital Stay (HOSPITAL_COMMUNITY)
Admission: RE | Admit: 2013-10-06 | Discharge: 2013-10-08 | DRG: 470 | Disposition: A | Discharge status: Home Health | Payer: Medicare PPO | Source: Ambulatory Visit | Attending: Orthopedic Surgery | Admitting: Orthopedic Surgery

## 2013-10-06 DIAGNOSIS — Z96649 Presence of unspecified artificial hip joint: Secondary | ICD-10-CM

## 2013-10-06 DIAGNOSIS — M1610 Unilateral primary osteoarthritis, unspecified hip: Secondary | ICD-10-CM | POA: Diagnosis present

## 2013-10-06 DIAGNOSIS — M169 Osteoarthritis of hip, unspecified: Secondary | ICD-10-CM | POA: Diagnosis present

## 2013-10-06 DIAGNOSIS — Z6841 Body Mass Index (BMI) 40.0 and over, adult: Secondary | ICD-10-CM | POA: Diagnosis not present

## 2013-10-06 DIAGNOSIS — R4182 Altered mental status, unspecified: Secondary | ICD-10-CM

## 2013-10-06 DIAGNOSIS — E66813 Obesity, class 3: Secondary | ICD-10-CM | POA: Diagnosis present

## 2013-10-06 DIAGNOSIS — D62 Acute posthemorrhagic anemia: Secondary | ICD-10-CM | POA: Diagnosis not present

## 2013-10-06 HISTORY — PX: TOTAL HIP ARTHROPLASTY: SHX124

## 2013-10-06 LAB — TYPE AND SCREEN
ABO/RH(D): O POS
Antibody Screen: NEGATIVE

## 2013-10-06 LAB — GLUCOSE, CAPILLARY: Glucose-Capillary: 102 mg/dL — ABNORMAL HIGH (ref 70–99)

## 2013-10-06 SURGERY — ARTHROPLASTY, HIP, TOTAL, ANTERIOR APPROACH
Anesthesia: General | Site: Hip | Laterality: Left | Wound class: Clean

## 2013-10-06 MED ORDER — MAGNESIUM 30 MG PO TABS: 30.0000 mg | ORAL_TABLET | Freq: Every day | ORAL | Status: DC

## 2013-10-06 MED ORDER — PROMETHAZINE HCL 25 MG/ML IJ SOLN: 6.2500 mg | INTRAMUSCULAR | Status: DC | PRN

## 2013-10-06 MED ORDER — HYDROMORPHONE HCL PF 1 MG/ML IJ SOLN: 0.5000 mg | INTRAMUSCULAR | Status: DC | PRN

## 2013-10-06 MED ORDER — HYDROCODONE-ACETAMINOPHEN 7.5-325 MG PO TABS
1.0000 | ORAL_TABLET | ORAL | Status: DC
  Administered 2013-10-06 – 2013-10-07 (×4): 1 via ORAL
  Filled 2013-10-06: qty 1
  Filled 2013-10-06 (×7): qty 2
  Filled 2013-10-06: qty 1
  Filled 2013-10-06 (×2): qty 2
  Filled 2013-10-06: qty 1
  Filled 2013-10-06 (×4): qty 2

## 2013-10-06 MED ORDER — CISATRACURIUM BESYLATE (PF) 10 MG/5ML IV SOLN
INTRAVENOUS | Status: DC | PRN
  Administered 2013-10-06: 10 mg via INTRAVENOUS
  Administered 2013-10-06: 2 mg via INTRAVENOUS

## 2013-10-06 MED ORDER — POLYETHYLENE GLYCOL 3350 17 G PO PACK
17.0000 g | PACK | Freq: Two times a day (BID) | ORAL | Status: DC
  Administered 2013-10-06 – 2013-10-08 (×4): 17 g via ORAL
  Filled 2013-10-06 (×3): qty 1

## 2013-10-06 MED ORDER — DEXAMETHASONE SODIUM PHOSPHATE 10 MG/ML IJ SOLN
10.0000 mg | Freq: Once | INTRAMUSCULAR | Status: AC
  Administered 2013-10-06: 10 mg via INTRAVENOUS

## 2013-10-06 MED ORDER — SUCCINYLCHOLINE CHLORIDE 20 MG/ML IJ SOLN
INTRAMUSCULAR | Status: DC | PRN
  Administered 2013-10-06: 140 mg via INTRAVENOUS

## 2013-10-06 MED ORDER — LACTATED RINGERS IV SOLN
INTRAVENOUS | Status: DC
  Administered 2013-10-06: 1000 mL via INTRAVENOUS

## 2013-10-06 MED ORDER — ASPIRIN EC 325 MG PO TBEC
325.0000 mg | DELAYED_RELEASE_TABLET | Freq: Two times a day (BID) | ORAL | Status: DC
Start: 2013-10-07 — End: 2013-10-08
  Administered 2013-10-07 – 2013-10-08 (×3): 325 mg via ORAL
  Filled 2013-10-06 (×5): qty 1

## 2013-10-06 MED ORDER — GLYCOPYRROLATE 0.2 MG/ML IJ SOLN
INTRAMUSCULAR | Status: DC | PRN
  Administered 2013-10-06: 0.4 mg via INTRAVENOUS

## 2013-10-06 MED ORDER — FLEET ENEMA 7-19 GM/118ML RE ENEM: 1.0000 | ENEMA | Freq: Once | RECTAL | Status: AC | PRN

## 2013-10-06 MED ORDER — CEFAZOLIN SODIUM-DEXTROSE 2-3 GM-% IV SOLR
2.0000 g | INTRAVENOUS | Status: AC
  Administered 2013-10-06: 2 g via INTRAVENOUS

## 2013-10-06 MED ORDER — POTASSIUM CHLORIDE 2 MEQ/ML IV SOLN
100.0000 mL/h | INTRAVENOUS | Status: DC
Start: 2013-10-06 — End: 2013-10-08
  Administered 2013-10-06 – 2013-10-07 (×2): 100 mL/h via INTRAVENOUS
  Filled 2013-10-06 (×6): qty 1000

## 2013-10-06 MED ORDER — CELECOXIB 200 MG PO CAPS
200.0000 mg | ORAL_CAPSULE | Freq: Two times a day (BID) | ORAL | Status: DC
  Administered 2013-10-06 – 2013-10-07 (×3): 200 mg via ORAL
  Filled 2013-10-06 (×5): qty 1

## 2013-10-06 MED ORDER — DEXAMETHASONE SODIUM PHOSPHATE 10 MG/ML IJ SOLN
10.0000 mg | Freq: Once | INTRAMUSCULAR | Status: DC
  Filled 2013-10-06 (×3): qty 1

## 2013-10-06 MED ORDER — DOCUSATE SODIUM 100 MG PO CAPS
100.0000 mg | ORAL_CAPSULE | Freq: Two times a day (BID) | ORAL | Status: DC
  Administered 2013-10-06 – 2013-10-08 (×4): 100 mg via ORAL
  Filled 2013-10-06 (×3): qty 1

## 2013-10-06 MED ORDER — LACTATED RINGERS IV SOLN: INTRAVENOUS | Status: DC

## 2013-10-06 MED ORDER — HYDROMORPHONE HCL PF 1 MG/ML IJ SOLN
INTRAMUSCULAR | Status: DC | PRN
  Administered 2013-10-06 (×2): 0.5 mg via INTRAVENOUS

## 2013-10-06 MED ORDER — FENTANYL CITRATE 0.05 MG/ML IJ SOLN
INTRAMUSCULAR | Status: DC | PRN
  Administered 2013-10-06 (×2): 50 ug via INTRAVENOUS
  Administered 2013-10-06: 100 ug via INTRAVENOUS
  Administered 2013-10-06: 50 ug via INTRAVENOUS

## 2013-10-06 MED ORDER — METHOCARBAMOL 500 MG PO TABS: 500.0000 mg | ORAL_TABLET | Freq: Four times a day (QID) | ORAL | Status: DC | PRN

## 2013-10-06 MED ORDER — MIDAZOLAM HCL 5 MG/5ML IJ SOLN
INTRAMUSCULAR | Status: DC | PRN
  Administered 2013-10-06: 1 mg via INTRAVENOUS

## 2013-10-06 MED ORDER — CEFAZOLIN SODIUM-DEXTROSE 2-3 GM-% IV SOLR
2.0000 g | Freq: Four times a day (QID) | INTRAVENOUS | Status: AC
  Administered 2013-10-06 (×2): 2 g via INTRAVENOUS
  Filled 2013-10-06 (×2): qty 50

## 2013-10-06 MED ORDER — PHENOL 1.4 % MT LIQD: 1.0000 | OROMUCOSAL | Status: DC | PRN

## 2013-10-06 MED ORDER — SODIUM CHLORIDE 0.9 % IR SOLN
Status: DC | PRN
  Administered 2013-10-06: 500 mL

## 2013-10-06 MED ORDER — DEXTROSE 5 % IV SOLN
500.0000 mg | Freq: Four times a day (QID) | INTRAVENOUS | Status: DC | PRN
  Filled 2013-10-06: qty 5

## 2013-10-06 MED ORDER — EPHEDRINE SULFATE 50 MG/ML IJ SOLN
INTRAMUSCULAR | Status: DC | PRN
  Administered 2013-10-06: 10 mg via INTRAVENOUS
  Administered 2013-10-06: 5 mg via INTRAVENOUS

## 2013-10-06 MED ORDER — ONDANSETRON HCL 4 MG PO TABS: 4.0000 mg | ORAL_TABLET | Freq: Four times a day (QID) | ORAL | Status: DC | PRN

## 2013-10-06 MED ORDER — CEFAZOLIN SODIUM-DEXTROSE 2-3 GM-% IV SOLR
INTRAVENOUS | Status: AC
  Filled 2013-10-06: qty 50

## 2013-10-06 MED ORDER — FERROUS SULFATE 325 (65 FE) MG PO TABS
325.0000 mg | ORAL_TABLET | Freq: Three times a day (TID) | ORAL | Status: DC
Start: 2013-10-06 — End: 2013-10-08
  Administered 2013-10-06 – 2013-10-08 (×5): 325 mg via ORAL
  Filled 2013-10-06 (×8): qty 1

## 2013-10-06 MED ORDER — ALUM & MAG HYDROXIDE-SIMETH 200-200-20 MG/5ML PO SUSP: 30.0000 mL | ORAL | Status: DC | PRN

## 2013-10-06 MED ORDER — ATORVASTATIN CALCIUM 10 MG PO TABS
10.0000 mg | ORAL_TABLET | Freq: Every day | ORAL | Status: DC
  Filled 2013-10-06 (×2): qty 1

## 2013-10-06 MED ORDER — LACTATED RINGERS IV SOLN
INTRAVENOUS | Status: DC | PRN
  Administered 2013-10-06 (×3): via INTRAVENOUS

## 2013-10-06 MED ORDER — ONDANSETRON HCL 4 MG/2ML IJ SOLN: 4.0000 mg | Freq: Four times a day (QID) | INTRAMUSCULAR | Status: DC | PRN

## 2013-10-06 MED ORDER — METOCLOPRAMIDE HCL 10 MG PO TABS: 5.0000 mg | ORAL_TABLET | Freq: Three times a day (TID) | ORAL | Status: DC | PRN

## 2013-10-06 MED ORDER — METOCLOPRAMIDE HCL 5 MG/ML IJ SOLN: 5.0000 mg | Freq: Three times a day (TID) | INTRAMUSCULAR | Status: DC | PRN

## 2013-10-06 MED ORDER — ONDANSETRON HCL 4 MG/2ML IJ SOLN
INTRAMUSCULAR | Status: DC | PRN
  Administered 2013-10-06: 4 mg via INTRAVENOUS

## 2013-10-06 MED ORDER — BISACODYL 10 MG RE SUPP: 10.0000 mg | Freq: Every day | RECTAL | Status: DC | PRN

## 2013-10-06 MED ORDER — MENTHOL 3 MG MT LOZG: 1.0000 | LOZENGE | OROMUCOSAL | Status: DC | PRN

## 2013-10-06 MED ORDER — KETAMINE HCL 10 MG/ML IJ SOLN
INTRAMUSCULAR | Status: DC | PRN
  Administered 2013-10-06 (×2): 10 mg via INTRAVENOUS

## 2013-10-06 MED ORDER — DIPHENHYDRAMINE HCL 25 MG PO CAPS: 25.0000 mg | ORAL_CAPSULE | Freq: Four times a day (QID) | ORAL | Status: DC | PRN

## 2013-10-06 MED ORDER — PROPOFOL INFUSION 10 MG/ML OPTIME
INTRAVENOUS | Status: DC | PRN
  Administered 2013-10-06: 200 mL via INTRAVENOUS

## 2013-10-06 MED ORDER — TRANEXAMIC ACID 100 MG/ML IV SOLN
1000.0000 mg | Freq: Once | INTRAVENOUS | Status: AC
  Administered 2013-10-06: 1000 mg via INTRAVENOUS
  Filled 2013-10-06: qty 10

## 2013-10-06 MED ORDER — NEOSTIGMINE METHYLSULFATE 1 MG/ML IJ SOLN
INTRAMUSCULAR | Status: DC | PRN
  Administered 2013-10-06: 3 mg via INTRAVENOUS

## 2013-10-06 MED ORDER — LIDOCAINE HCL (CARDIAC) 20 MG/ML IV SOLN
INTRAVENOUS | Status: DC | PRN
  Administered 2013-10-06: 30 mg via INTRAVENOUS

## 2013-10-06 MED ORDER — ZOLPIDEM TARTRATE 5 MG PO TABS: 5.0000 mg | ORAL_TABLET | Freq: Every evening | ORAL | Status: DC | PRN

## 2013-10-06 MED ORDER — HYDROMORPHONE HCL PF 1 MG/ML IJ SOLN: 0.2500 mg | INTRAMUSCULAR | Status: DC | PRN

## 2013-10-06 SURGICAL SUPPLY — 41 items
ADH SKN CLS APL DERMABOND .7 (GAUZE/BANDAGES/DRESSINGS) ×1
BAG SPEC THK2 15X12 ZIP CLS (MISCELLANEOUS) ×2
BAG ZIPLOCK 12X15 (MISCELLANEOUS) ×4 IMPLANT
BLADE SAW SGTL 18X1.27X75 (BLADE) ×2 IMPLANT
CAPT HIP PF COP ×1 IMPLANT
CLOTH BEACON ORANGE TIMEOUT ST (SAFETY) ×2 IMPLANT
DERMABOND ADVANCED (GAUZE/BANDAGES/DRESSINGS) ×1
DERMABOND ADVANCED .7 DNX12 (GAUZE/BANDAGES/DRESSINGS) ×1 IMPLANT
DRAPE C-ARM 42X120 X-RAY (DRAPES) ×2 IMPLANT
DRAPE STERI IOBAN 125X83 (DRAPES) ×2 IMPLANT
DRAPE U-SHAPE 47X51 STRL (DRAPES) ×6 IMPLANT
DRSG AQUACEL AG ADV 3.5X10 (GAUZE/BANDAGES/DRESSINGS) ×2 IMPLANT
DRSG TEGADERM 4X4.75 (GAUZE/BANDAGES/DRESSINGS) IMPLANT
DURAPREP 26ML APPLICATOR (WOUND CARE) ×2 IMPLANT
ELECT BLADE TIP CTD 4 INCH (ELECTRODE) ×2 IMPLANT
ELECT REM PT RETURN 9FT ADLT (ELECTROSURGICAL) ×2
ELECTRODE REM PT RTRN 9FT ADLT (ELECTROSURGICAL) ×1 IMPLANT
EVACUATOR 1/8 PVC DRAIN (DRAIN) IMPLANT
FACESHIELD LNG OPTICON STERILE (SAFETY) ×8 IMPLANT
GAUZE SPONGE 2X2 8PLY STRL LF (GAUZE/BANDAGES/DRESSINGS) ×1 IMPLANT
GLOVE BIOGEL PI IND STRL 7.5 (GLOVE) ×1 IMPLANT
GLOVE BIOGEL PI IND STRL 8 (GLOVE) ×1 IMPLANT
GLOVE BIOGEL PI INDICATOR 7.5 (GLOVE) ×1
GLOVE BIOGEL PI INDICATOR 8 (GLOVE) ×1
GLOVE ECLIPSE 8.0 STRL XLNG CF (GLOVE) ×2 IMPLANT
GLOVE ORTHO TXT STRL SZ7.5 (GLOVE) ×4 IMPLANT
GOWN BRE IMP PREV XXLGXLNG (GOWN DISPOSABLE) ×2 IMPLANT
GOWN PREVENTION PLUS LG XLONG (DISPOSABLE) ×2 IMPLANT
HEAD CERAMIC 36 PLUS 8.5 12 14 (Hips) IMPLANT
KIT BASIN OR (CUSTOM PROCEDURE TRAY) ×2 IMPLANT
PACK TOTAL JOINT (CUSTOM PROCEDURE TRAY) ×2 IMPLANT
PADDING CAST COTTON 6X4 STRL (CAST SUPPLIES) ×2 IMPLANT
SPONGE GAUZE 2X2 STER 10/PKG (GAUZE/BANDAGES/DRESSINGS) ×1
SUCTION FRAZIER 12FR DISP (SUCTIONS) ×2 IMPLANT
SUT MNCRL AB 4-0 PS2 18 (SUTURE) ×2 IMPLANT
SUT VIC AB 1 CT1 36 (SUTURE) ×8 IMPLANT
SUT VIC AB 2-0 CT1 27 (SUTURE) ×4
SUT VIC AB 2-0 CT1 TAPERPNT 27 (SUTURE) ×2 IMPLANT
SUT VLOC 180 0 24IN GS25 (SUTURE) ×2 IMPLANT
TOWEL OR 17X26 10 PK STRL BLUE (TOWEL DISPOSABLE) ×4 IMPLANT
TRAY FOLEY CATH 14FRSI W/METER (CATHETERS) ×2 IMPLANT

## 2013-10-06 NOTE — Progress Notes (Signed)
Utilization review completed.  

## 2013-10-06 NOTE — Anesthesia Preprocedure Evaluation (Addendum)
Anesthesia Evaluation  Patient identified by MRN, date of birth, ID band Patient awake    Reviewed: Allergy & Precautions, H&P , NPO status , Patient's Chart, lab work & pertinent test results  Airway Mallampati: II TM Distance: >3 FB Neck ROM: Full    Dental  (+) Teeth Intact and Dental Advisory Given   Pulmonary neg pulmonary ROS,  breath sounds clear to auscultation  Pulmonary exam normal       Cardiovascular negative cardio ROS  Rhythm:Regular Rate:Normal + Systolic murmurs    Neuro/Psych negative neurological ROS  negative psych ROS   GI/Hepatic negative GI ROS, Neg liver ROS,   Endo/Other  negative endocrine ROS  Renal/GU negative Renal ROS  negative genitourinary   Musculoskeletal negative musculoskeletal ROS (+)   Abdominal   Peds  Hematology negative hematology ROS (+)   Anesthesia Other Findings   Reproductive/Obstetrics                           Anesthesia Physical Anesthesia Plan  ASA: II  Anesthesia Plan: General   Post-op Pain Management:    Induction: Intravenous  Airway Management Planned: Oral ETT  Additional Equipment:   Intra-op Plan:   Post-operative Plan: Extubation in OR  Informed Consent: I have reviewed the patients History and Physical, chart, labs and discussed the procedure including the risks, benefits and alternatives for the proposed anesthesia with the patient or authorized representative who has indicated his/her understanding and acceptance.   Dental advisory given  Plan Discussed with: CRNA  Anesthesia Plan Comments:         Anesthesia Quick Evaluation

## 2013-10-06 NOTE — Interval H&P Note (Signed)
History and Physical Interval Note:  10/06/2013 6:56 AM  Anita Blake  has presented today for surgery, with the diagnosis of LEFT HIP OA  The various methods of treatment have been discussed with the patient and family. After consideration of risks, benefits and other options for treatment, the patient has consented to  Procedure(s): LEFT TOTAL HIP ARTHROPLASTY ANTERIOR APPROACH (Left) as a surgical intervention .  The patient's history has been reviewed, patient examined, no change in status, stable for surgery.  I have reviewed the patient's chart and labs.  Questions were answered to the patient's satisfaction.     Mauri Pole

## 2013-10-06 NOTE — Anesthesia Postprocedure Evaluation (Signed)
Anesthesia Post Note  Patient: Anita Blake  Procedure(s) Performed: Procedure(s) (LRB): LEFT TOTAL HIP ARTHROPLASTY ANTERIOR APPROACH (Left)  Anesthesia type: General  Patient location: PACU  Post pain: Pain level controlled  Post assessment: Post-op Vital signs reviewed  Last Vitals:  Filed Vitals:   10/06/13 1603  BP:   Pulse:   Temp: 36.7 C  Resp:     Post vital signs: Reviewed  Level of consciousness: sedated  Complications: No apparent anesthesia complications

## 2013-10-06 NOTE — Progress Notes (Signed)
PACU Nursing Note" Dr Nicole Kindred, MD (Neuro) at bedside

## 2013-10-06 NOTE — Progress Notes (Signed)
PACU Nursing Note: Dr Alvan Dame @ bedside

## 2013-10-06 NOTE — Progress Notes (Signed)
PACU Nursing Note: Pt immediately tx CT scan w/ PACU RN

## 2013-10-06 NOTE — Progress Notes (Signed)
PACU NURSING NOTE: Pt tx back to PACU w/PACU RN, port cardiac monitor, port oxygen

## 2013-10-06 NOTE — Progress Notes (Signed)
Called to PACU to evaluate patient for acute mental status change. Recovery nurse noted change in patients level of consciousness with diminished response to verbal commands and inability to move extremities. On exam patient appears awake but no longer moving extremities when requested. Unable to answer questions and weak response to grip strength of upper extremities. Patient appears incapable of protruding tongue on command. Patient had been moving all extremities and indicated verbally that she was not in pain prior to this mental status change. No medication has been administered since patients arrival in PACU. Dr. Alvan Dame made aware and will obtain STAT CT scan of brain to R/O cerebrovascular event and STAT neurology consult.

## 2013-10-06 NOTE — Progress Notes (Signed)
PACU Nursing Note: pt ready for PACU discharge, pt now very sleepy, will not respond verbally to questions, PERRLA, CBG ck'd, @ 122m/dl, skin warm and dry, NSR w/ occ PAC noted on monitor, no meds given in PACU, Dr FWinfred Leeds(Anesthesiologist) notified of this nurses finding. Facial symmetry WNL, pt will not hold Rt & Lt arms up, pt will now not move legs, MDA at bedside to assess patient

## 2013-10-06 NOTE — Transfer of Care (Signed)
Immediate Anesthesia Transfer of Care Note  Patient: Anita Blake  Procedure(s) Performed: Procedure(s): LEFT TOTAL HIP ARTHROPLASTY ANTERIOR APPROACH (Left)  Patient Location: PACU  Anesthesia Type:General  Level of Consciousness: awake, sedated and patient cooperative  Airway & Oxygen Therapy: Patient Spontanous Breathing and Patient connected to face mask oxygen  Post-op Assessment: Report given to PACU RN and Post -op Vital signs reviewed and stable  Post vital signs: stable  Complications: No apparent anesthesia complications

## 2013-10-06 NOTE — Consult Note (Signed)
Reason for Consult: Altered mental status.  HPI:                                                                                                                                          Anita Blake is an 67 y.o. female with a history of arthritis and anemia who underwent left total hip replacement earlier today. Postoperatively patient was initially responsive and can follow commands. At about 1:30 she developed staring with inability to follow commands as well as diffuse loss of muscle tone. There was also no speech output. Symptoms lasted for about 20 minutes then started to improve without specific intervention. No focal weakness nor focal motor activity and was noted. There was no abnormal posturing. Patient's vital signs remained stable. She had not received any potentially sedating medications for about 2 hours prior to the onset of these changes. CT scan of her head is obtained which showed no acute intracranial abnormality. Patient subsequently became responsive and oriented with no difficulty following commands. No focal deficits were noted.  Past Medical History  Diagnosis Date  . Arthritis   . Anemia yrs ago  . Complication of anesthesia     slow to wake up    Past Surgical History  Procedure Laterality Date  . Abdominal hysterectomy    . Eye surgery Left 2007    macular hole in retina  . Cataract surgery Left   . Right arm elbow surgery      ligament repair  . Index finger surgery Right     History reviewed. No pertinent family history.  Social History:  reports that she has never smoked. She has never used smokeless tobacco. She reports that she does not drink alcohol or use illicit drugs.  Allergies  Allergen Reactions  . Latex Rash    MEDICATIONS:                                                                                                                     I have reviewed the patient's current medications.   ROS:  History obtained from chart review  General ROS: negative for - chills, fatigue, fever, night sweats, weight gain or weight loss Psychological ROS: negative for - behavioral disorder, hallucinations, memory difficulties, mood swings or suicidal ideation Ophthalmic ROS: negative for - blurry vision, double vision, eye pain or loss of vision ENT ROS: negative for - epistaxis, nasal discharge, oral lesions, sore throat, tinnitus or vertigo Allergy and Immunology ROS: negative for - hives or itchy/watery eyes Hematological and Lymphatic ROS: negative for - bleeding problems, bruising or swollen lymph nodes Endocrine ROS: negative for - galactorrhea, hair pattern changes, polydipsia/polyuria or temperature intolerance Respiratory ROS: negative for - cough, hemoptysis, shortness of breath or wheezing Cardiovascular ROS: negative for - chest pain, dyspnea on exertion, edema or irregular heartbeat Gastrointestinal ROS: negative for - abdominal pain, diarrhea, hematemesis, nausea/vomiting or stool incontinence Genito-Urinary ROS: negative for - dysuria, hematuria, incontinence or urinary frequency/urgency Musculoskeletal ROS: See history of present illness Neurological ROS: as noted in HPI Dermatological ROS: negative for rash and skin lesion changes   Blood pressure 82/69, pulse 79, temperature 98 F (36.7 C), temperature source Oral, resp. rate 15, SpO2 100.00%.   Neurologic Examination:                                                                                                      Patient was somewhat groggy but could be aroused easily. She was well-oriented to time and place. There was no receptive or expressive aphasia. She followed commands well. Pupils were equal and reacted normally to light. Extraocular movements were full and conjugate. Visual fields were intact and normal. No facial weakness  was noted. Speech was slightly hoarse but otherwise unremarkable. Palatal movement was symmetrical. Strength of upper extremities was normal proximally and distally. Strength of right lower extremity and distal left lower extremity was normal. Muscle tone was normal throughout. Deep tendon reflexes were symmetrical nipple extremities and were normal at the right knee and both ankles. Plantar responses were flexor bilaterally. Sensory exam was normal.  No results found for this basename: cbc, bmp, coags, chol, tri, ldl, hga1c    Results for orders placed during the hospital encounter of 10/06/13 (from the past 48 hour(s))  GLUCOSE, CAPILLARY     Status: Abnormal   Collection Time    10/06/13 12:59 PM      Result Value Range   Glucose-Capillary 102 (*) 70 - 99 mg/dL    Ct Head Wo Contrast  10/06/2013   CLINICAL DATA:  Code stroke the  EXAM: CT HEAD WITHOUT CONTRAST  TECHNIQUE: Contiguous axial images were obtained from the base of the skull through the vertex without intravenous contrast.  COMPARISON:  11/16/2005.  FINDINGS: No skull fracture is noted. Paranasal sinuses and mastoid air cells are unremarkable.  No intracranial hemorrhage, mass effect or midline shift. No definite acute cortical infarction. No mass lesion is noted on this unenhanced scan. No hydrocephalus. No intraventricular hemorrhage.  IMPRESSION: No acute intracranial abnormality. No definite acute cortical infarction.  Critical Value/emergent results were called by telephone at the time of interpretation on 10/06/2013  at 1:34 PM to [place] , who verbally acknowledged these results.   Electronically Signed   By: Lahoma Crocker M.D.   On: 10/06/2013 13:35   Dg Pelvis Portable  10/06/2013   CLINICAL DATA:  Left hip arthroplasty. Postoperative radiographs.  EXAM: PORTABLE PELVIS  COMPARISON:  None.  FINDINGS: Uncomplicated new left total hip arthroplasty. The hip is located.  IMPRESSION: Uncomplicated new left total hip  arthroplasty.   Electronically Signed   By: Dereck Ligas M.D.   On: 10/06/2013 12:50   Dg Hip Portable 1 View Left  10/06/2013   CLINICAL DATA:  Left total hip arthroplasty.  EXAM: PORTABLE LEFT HIP - 1 VIEW  COMPARISON:  Postoperative frontal view and preoperative radiographs 01/03/2010.  FINDINGS: New left total hip arthroplasty is located. No periprosthetic fracture or acute abnormality.  IMPRESSION: Uncomplicated new left total hip arthroplasty.   Electronically Signed   By: Dereck Ligas M.D.   On: 10/06/2013 12:50   Assessment/Plan: Altered mental status with transient period of reduced responsiveness of unclear etiology. Patient apparently did not lose consciousness, but was inattentive to external stimuli and surroundings, as well as noncommunicative. No frank seizure activity was noted. Patient has no history of similar spells. She did not receive any potential sedating medicines for at least 2 hours prior to the onset of mental status abnormalities. Exam at this point shows normal mental status with no focal deficits. Symptoms did not indicate focal cerebrovascular area of ischemia, as would be seen with TIA.  Agree with admission to step down unit overnight for close monitoring. I will order an EEG to be done in the a.m. to rule out an indication of possible seizure disorder. There are no indications for TIA/stroke workup. No specific treatment intervention is indicated at this point.  We will reevaluate this patient in followup tomorrow.  C.R. Nicole Kindred, MD [facility]  [phone]  10/06/2013, 4:21 PM

## 2013-10-06 NOTE — Op Note (Signed)
NAME:  Anita Blake                ACCOUNT NO.: [id number]      MEDICAL RECORD NO.: [id number]      FACILITY:  [facility]      PHYSICIAN:  Paralee Cancel D  DATE OF BIRTH:  [birth year]     DATE OF PROCEDURE:  10/06/2013                                 OPERATIVE REPORT         PREOPERATIVE DIAGNOSIS: Left  hip osteoarthritis.      POSTOPERATIVE DIAGNOSIS:  Left hip osteoarthritis.      PROCEDURE:  Left total hip replacement through an anterior approach   utilizing DePuy THR system, component size 39m pinnacle cup, a size 32+4 neutral   Altrex liner, a size 2 Hi Tri Lock stem with a 32+9 delta ceramic   ball.      SURGEON:  MPietro Cassis OAlvan Dame M.D.      ASSISTANT:  MDanae Orleans PA-C     ANESTHESIA:  General.      SPECIMENS:  None.      COMPLICATIONS:  None.      BLOOD LOSS:  400 cc     DRAINS:  None.      INDICATION OF THE PROCEDURE:  Anita BAEHRis a 67y.o. female who had   presented to office for evaluation of left hip pain.  Radiographs revealed   progressive degenerative changes with bone-on-bone   articulation to the  hip joint.  The patient had painful limited range of   motion significantly affecting their overall quality of life.  The patient was failing to    respond to conservative measures, and at this point was ready   to proceed with more definitive measures.  The patient has noted progressive   degenerative changes in his hip, progressive problems and dysfunction   with regarding the hip prior to surgery.  Consent was obtained for   benefit of pain relief.  Specific risk of infection, DVT, component   failure, dislocation, need for revision surgery, as well discussion of   the anterior versus posterior approach were reviewed.  Consent was   obtained for benefit of anterior pain relief through an anterior   approach.      PROCEDURE IN DETAIL:  The patient was brought to operative theater.   Once adequate anesthesia, preoperative  antibiotics, 2gm Ancef administered.   The patient was positioned supine on the OSI Hanna table.  Once adequate   padding of boney process was carried out, we had predraped out the hip, and  used fluoroscopy to confirm orientation of the pelvis and position.      The left hip was then prepped and draped from proximal iliac crest to   mid thigh with shower curtain technique.      Time-out was performed identifying the patient, planned procedure, and   extremity.     An incision was then made 2 cm distal and lateral to the   anterior superior iliac spine extending over the orientation of the   tensor fascia lata muscle and sharp dissection was carried down to the   fascia of the muscle and protractor placed in the soft tissues.      The fascia was then incised.  The muscle belly was identified and swept  laterally and retractor placed along the superior neck.  Following   cauterization of the circumflex vessels and removing some pericapsular   fat, a second cobra retractor was placed on the inferior neck.  A third   retractor was placed on the anterior acetabulum after elevating the   anterior rectus.  A L-capsulotomy was along the line of the   superior neck to the trochanteric fossa, then extended proximally and   distally.  Tag sutures were placed and the retractors were then placed   intracapsular.  We then identified the trochanteric fossa and   orientation of my neck cut, confirmed this radiographically   and then made a neck osteotomy with the femur on traction.  The femoral   head was removed without difficulty or complication.  Traction was let   off and retractors were placed posterior and anterior around the   acetabulum.      The labrum and foveal tissue were debrided.  I began reaming with a 66m   reamer and reamed up to 41mreamer with good bony bed preparation and a 50   cup was chosen.  The final 5044minnacle cup was then impacted under fluoroscopy  to confirm the  depth of penetration and orientation with respect to   abduction.  A screw was placed followed by the hole eliminator.  The final   32+4 neutral Altrex liner was impacted with good visualized rim fit.  The cup was positioned anatomically within the acetabular portion of the pelvis.      At this point, the femur was rolled at 80 degrees.  Further capsule was   released off the inferior aspect of the femoral neck.  I then   released the superior capsule proximally.  The hook was placed laterally   along the femur and elevated manually and held in position with the bed   hook.  The leg was then extended and adducted with the leg rolled to 100   degrees of external rotation.  Once the proximal femur was fully   exposed, I used a box osteotome to set orientation.  I then began   broaching with the starting chili pepper broach and passed this by hand and then broached up to 2.  With the 2 broach in place I chose a high offset neck and did a trial reductions first with the +5 ball then the +9.  The offset was best reproduced and leg lengths   appeared to be equal, confirmed radiographically with the 9.   Given these findings, I went ahead and dislocated the hip, repositioned all   retractors and positioned the right hip in the extended and abducted position.  The final 2Hi Tri Lock stem was   chosen and it was impacted down to the level of neck cut.  Based on this   and the trial reduction, a 32+9 delta ceramic ball was chosen and   impacted onto a clean and dry trunnion, and the hip was reduced.  The   hip had been irrigated throughout the case again at this point.  I did   reapproximate the superior capsular leaflet to the anterior leaflet   using #1 Vicryl, placed a medium Hemovac drain deep.  The fascia of the   tensor fascia lata muscle was then reapproximated using #1 Vicryl.  The   remaining wound was closed with 2-0 Vicryl and running 4-0 Monocryl.   The hip was cleaned, dried, and dressed  sterilely using Dermabond and  Aquacel dressing.  Drain site dressed separately.  She was then brought   to recovery room in stable condition tolerating the procedure well.    Danae Orleans, PA-C was present for the entirety of the case involved from   preoperative positioning, perioperative retractor management, general   facilitation of the case, as well as primary wound closure as assistant.            Pietro Cassis Alvan Dame, M.D.            MDO/MEDQ  D:  10/09/2011  T:  10/09/2011  Job:  [id number]      Electronically Signed by Paralee Cancel M.D. on 10/15/2011 09:15:38 AM

## 2013-10-07 ENCOUNTER — Inpatient Hospital Stay (HOSPITAL_COMMUNITY)
Admission: RE | Admit: 2013-10-07 | Discharge: 2013-10-07 | Disposition: A | Discharge status: Home / Self Care | Payer: Medicare PPO | Source: Ambulatory Visit | Attending: Neurology | Admitting: Neurology

## 2013-10-07 ENCOUNTER — Encounter (HOSPITAL_COMMUNITY): Payer: Self-pay | Admitting: Orthopedic Surgery

## 2013-10-07 LAB — CBC
HCT: 30.7 % — ABNORMAL LOW (ref 36.0–46.0)
Hemoglobin: 10 g/dL — ABNORMAL LOW (ref 12.0–15.0)
MCH: 26.2 pg (ref 26.0–34.0)
MCHC: 32.6 g/dL (ref 30.0–36.0)
MCV: 80.6 fL (ref 78.0–100.0)
RBC: 3.81 MIL/uL — ABNORMAL LOW (ref 3.87–5.11)

## 2013-10-07 LAB — BASIC METABOLIC PANEL
BUN: 9 mg/dL (ref 6–23)
CO2: 27 mEq/L (ref 19–32)
Creatinine, Ser: 0.63 mg/dL (ref 0.50–1.10)
GFR calc non Af Amer: >90 mL/min (ref >90)
Glucose, Bld: 136 mg/dL — ABNORMAL HIGH (ref 70–99)
Potassium: 4 mEq/L (ref 3.5–5.1)
Sodium: 136 mEq/L (ref 135–145)

## 2013-10-07 NOTE — Progress Notes (Signed)
Subjective: Patient doing well.  Back to baseline.  No further episodes of unresponsiveness.    Objective: Current vital signs: BP 110/63  Pulse 74  Temp(Src) 98.5 F (36.9 C) (Oral)  Resp 16  Ht 5' 3.5" (1.613 m)  Wt 116.2 kg (256 lb 2.8 oz)  BMI 44.66 kg/m2  SpO2 96% Vital signs in last 24 hours: Temp:  [98.5 F (36.9 C)-98.7 F (37.1 C)] 98.5 F (36.9 C) (10/22 1422) Pulse Rate:  [62-99] 74 (10/22 1422) Resp:  [13-20] 16 (10/22 1422) BP: (109-136)/(49-69) 110/63 mmHg (10/22 1422) SpO2:  [94 %-100 %] 96 % (10/22 1422)  Intake/Output from previous day: 10/21 0701 - 10/22 0700 In: 4760 [P.O.:360; I.V.:4350; IV Piggyback:50] Out: 7672 [Urine:3510; Blood:350] Intake/Output this shift: Total I/O In: 380 [P.O.:180; I.V.:200] Out: 350 [Urine:350] Nutritional status: General  Neurologic Exam: Mental Status: Alert, oriented, thought content appropriate.  Speech fluent without evidence of aphasia.  Able to follow 3 step commands without difficulty. Cranial Nerves: II: Discs flat bilaterally; Visual fields grossly normal, pupils equal, round, reactive to light and accommodation III,IV, VI: ptosis not present, extra-ocular motions intact bilaterally V,VII: smile symmetric, facial light touch sensation normal bilaterally VIII: hearing normal bilaterally IX,X: gag reflex present XI: bilateral shoulder shrug XII: midline tongue extension Motor: Right : Upper extremity   5/5    Left:     Upper extremity   5/5  Lower extremity   5/5     Lower extremity   5/5 Tone and bulk:normal tone throughout; no atrophy noted Sensory: Pinprick and light touch intact throughout, bilaterally Plantars: Right: downgoing   Left: downgoing Cerebellar: normal finger-to-nose  Lab Results: Basic Metabolic Panel:  Recent Labs Lab 10/01/13 0935 10/07/13 0340  NA 138 136  K 4.1 4.0  CL 102 100  CO2 29 27  GLUCOSE 120* 136*  BUN 19 9  CREATININE 0.87 0.63  CALCIUM 9.6 8.9    Liver  Function Tests: No results found for this basename: AST, ALT, ALKPHOS, BILITOT, PROT, ALBUMIN,  in the last 168 hours No results found for this basename: LIPASE, AMYLASE,  in the last 168 hours No results found for this basename: AMMONIA,  in the last 168 hours  CBC:  Recent Labs Lab 10/01/13 0935 10/07/13 0340  WBC 6.9 8.7  HGB 12.6 10.0*  HCT 39.2 30.7*  MCV 80.8 80.6  PLT 292 241    Cardiac Enzymes: No results found for this basename: CKTOTAL, CKMB, CKMBINDEX, TROPONINI,  in the last 168 hours  Lipid Panel: No results found for this basename: CHOL, TRIG, HDL, CHOLHDL, VLDL, LDLCALC,  in the last 168 hours  CBG:  Recent Labs Lab 10/06/13 1259  [place]*    Microbiology: Results for orders placed during the hospital encounter of 10/01/13  SURGICAL PCR SCREEN     Status: None   Collection Time    10/01/13  9:27 AM      Result Value Range Status   MRSA, PCR NEGATIVE  NEGATIVE Final   Staphylococcus aureus NEGATIVE  NEGATIVE Final   Comment:            The Xpert SA Assay (FDA     approved for NASAL specimens     in patients over 29 years of age),     is one component of     a comprehensive surveillance     program.  Test performance has     been validated by [employer] for patients greater  than or equal to 59 year old.     It is not intended     to diagnose infection nor to     guide or monitor treatment.    Coagulation Studies: No results found for this basename: LABPROT, INR,  in the last 72 hours  Imaging: Ct Head Wo Contrast  10/06/2013   CLINICAL DATA:  Code stroke the  EXAM: CT HEAD WITHOUT CONTRAST  TECHNIQUE: Contiguous axial images were obtained from the base of the skull through the vertex without intravenous contrast.  COMPARISON:  11/16/2005.  FINDINGS: No skull fracture is noted. Paranasal sinuses and mastoid air cells are unremarkable.  No intracranial hemorrhage, mass effect or midline shift. No definite acute cortical infarction.  No mass lesion is noted on this unenhanced scan. No hydrocephalus. No intraventricular hemorrhage.  IMPRESSION: No acute intracranial abnormality. No definite acute cortical infarction.  Critical Value/emergent results were called by telephone at the time of interpretation on 10/06/2013 at 1:34 PM to [place] , who verbally acknowledged these results.   Electronically Signed   By: Lahoma Crocker M.D.   On: 10/06/2013 13:35   Dg Pelvis Portable  10/06/2013   CLINICAL DATA:  Left hip arthroplasty. Postoperative radiographs.  EXAM: PORTABLE PELVIS  COMPARISON:  None.  FINDINGS: Uncomplicated new left total hip arthroplasty. The hip is located.  IMPRESSION: Uncomplicated new left total hip arthroplasty.   Electronically Signed   By: Dereck Ligas M.D.   On: 10/06/2013 12:50   Dg Hip Portable 1 View Left  10/06/2013   CLINICAL DATA:  Left total hip arthroplasty.  EXAM: PORTABLE LEFT HIP - 1 VIEW  COMPARISON:  Postoperative frontal view and preoperative radiographs 01/03/2010.  FINDINGS: New left total hip arthroplasty is located. No periprosthetic fracture or acute abnormality.  IMPRESSION: Uncomplicated new left total hip arthroplasty.   Electronically Signed   By: Dereck Ligas M.D.   On: 10/06/2013 12:50   Dg C-arm 1-60 Min-no Report  10/06/2013   CLINICAL DATA: anterior hip left   C-ARM 1-60 MINUTES  Fluoroscopy was utilized by the requesting physician.  No radiographic  interpretation.     Medications:  I have reviewed the patient's current medications. Scheduled: . aspirin EC  325 mg Oral BID  . atorvastatin  10 mg Oral q1800  . celecoxib  200 mg Oral Q12H  . dexamethasone  10 mg Intravenous Once  . docusate sodium  100 mg Oral BID  . ferrous sulfate  325 mg Oral TID PC  . HYDROcodone-acetaminophen  1-2 tablet Oral Q4H  . polyethylene glycol  17 g Oral BID    Assessment/Plan: 67 year old female with an episode of unresponsiveness s/p surgery.  Now back to baseline.  Patient  reports that she has a history of difficulties awakening after surgery.  CT head reviewed and shows no acute abnormalities.  EEG unremarkable. Patient on ASA daily.    Recommendations: 1.  No further neurologic intervention is recommended at this time.  If further questions arise, please call or page at that time.  Thank you for allowing neurology to participate in the care of this patient.  Continue ASA.    LOS: 1 day   Alexis Goodell, MD [facility] [phone] 10/07/2013  5:38 PM

## 2013-10-07 NOTE — Progress Notes (Signed)
   Subjective: 1 Day Post-Op Procedure(s) (LRB): LEFT TOTAL HIP ARTHROPLASTY ANTERIOR APPROACH (Left)    Seen by Dr. Alvan Dame. Patient reports pain as mild, pain controlled. No events throughout the night. Has had no problems since the incident after surgery.  Objective:   VITALS:   Filed Vitals:   10/07/13  BP: 132/65  Pulse: 67  Temp: 98.6 F (37 C)  Resp: 19    Neurovascular intact Dorsiflexion/Plantar flexion intact Incision: dressing C/D/I No cellulitis present Compartment soft  LABS  Recent Labs  10/07/13 0340  HGB 10.0*  HCT 30.7*  WBC 8.7  PLT 241     Recent Labs  10/07/13 0340  NA 136  K 4.0  BUN 9  CREATININE 0.63  GLUCOSE 136*     Assessment/Plan: 1 Day Post-Op Procedure(s) (LRB): LEFT TOTAL HIP ARTHROPLASTY ANTERIOR APPROACH (Left) HV drain d/c'ed Foley cath d/c'ed May transfer off stepdown Advance diet Up with therapy D/C IV fluids  Expected ABLA  Treated with iron and will observe  Morbid Obesity (BMI >40)  Estimated body mass index is 44.66 kg/(m^2) as calculated from the following:   Height as of this encounter: 5' 3.5" (1.613 m).   Weight as of this encounter: 116.2 kg (256 lb 2.8 oz). Patient also counseled that weight may inhibit the healing process Patient counseled that losing weight will help with future health issues        [place] Pugh. Anyia Gierke   PAC  10/07/2013, 8:05 AM

## 2013-10-07 NOTE — Progress Notes (Signed)
Clinical Social Work Department BRIEF PSYCHOSOCIAL ASSESSMENT 10/07/2013  Patient:  Anita Blake, Anita Blake     Account Number:  [id number]     Admit date:  10/06/2013  Clinical Social Worker:  Lacie Scotts  Date/Time:  10/07/2013 11:21 AM  Referred by:  Physician  Date Referred:  10/06/2013 Referred for  SNF Placement   Other Referral:   Interview type:  Patient Other interview type:    PSYCHOSOCIAL DATA Living Status:  HUSBAND Admitted from facility:   Level of care:   Primary support name:  [facility] Primary support relationship to patient:  SPOUSE Degree of support available:   supportive    CURRENT CONCERNS Current Concerns  Other - See comment   Other Concerns:   SNF vs HOME    SOCIAL WORK ASSESSMENT / PLAN Pt is a 67 yr old female living at home prior to hospitalization. CSW met with pt to assist with d/c planning. Pt is unsure of her d/c plans at this time. She will work with PT today and will consider options based on recommendations. CSW will assist with SNF placement if needed.   Assessment/plan status:  Psychosocial Support/Ongoing Assessment of Needs Other assessment/ plan:   Information/referral to community resources:   SNF list with bed offers to be provided if needed.    PATIENT'S/FAMILY'S RESPONSE TO PLAN OF CARE: D/C plans will be made following PT recommendations.   Werner Lean LCSW [phone]

## 2013-10-07 NOTE — Progress Notes (Signed)
EEG completed; results pending.    

## 2013-10-07 NOTE — Evaluation (Signed)
Occupational Therapy Evaluation Patient Details Name: Anita Blake MRN: [id number] DOB: 03/30/1946 Today's Date: 10/07/2013 Time: 1343-1410 OT Time Calculation (min): 27 min  OT Assessment / Plan / Recommendation History of present illness S/p L direct anterior THA. Post op complications of degressed F/C, loss of muscle tone. Pt gradually improved. CT heat negative. Pt returned to normal state .   Clinical Impression   Pt was admitted for the above sx.  All education was completed.  Pt does not need any further OT    OT Assessment  Patient does not need any further OT services    Follow Up Recommendations  No OT follow up    Barriers to Discharge      Equipment Recommendations  3 in 1 bedside comode    Recommendations for Other Services    Frequency       Precautions / Restrictions Precautions Precautions: Fall   Pertinent Vitals/Pain Has soreness only; denied pain.  Repositioned; ice was removed    ADL  Lower Body Dressing: Minimal assistance (with ae) Where Assessed - Lower Body Dressing: Supported sit to stand Toilet Transfer: Minimal assistance Toilet Transfer Method: Sit to stand Toileting - Clothing Manipulation and Hygiene: Supervision/safety Where Assessed - Toileting Clothing Manipulation and Hygiene: Sit to stand from 3-in-1 or toilet Tub/Shower Transfer: Minimal assistance Tub/Shower Transfer Method: Therapist, art: Walk in shower Equipment Used: Reacher;Sock aid;Rolling walker Transfers/Ambulation Related to ADLs: ambulated to bathroom and practiced shower transfer;  educated on sidestepping through tight spaces and then returned to bed ADL Comments: pt educated on ae and practiced with sock aid and reacher.  She plans to get a kit.  verbalizes understanding of all and doesn't feel she needs additional practice.      OT Diagnosis:    OT Problem List:   OT Treatment Interventions:     OT Goals(Current goals can be found in the  care plan section) Acute Rehab OT Goals Patient Stated Goal: I want to walk.   Visit Information  Last OT Received On: 10/07/13 Assistance Needed: +1 History of Present Illness: S/p L direct anterior THA. Post op complications of degressed F/C, loss of muscle tone. Pt gradually improved. CT heat negative. Pt returned to normal state .       Prior [place] expects to be discharged to:: Private residence Living Arrangements: Spouse/significant other Available Help at Discharge: Family Type of Home: House Home Access: Stairs to enter Technical brewer of Steps: 2 Entrance Stairs-Rails: None Home Equipment: None Additional Comments: may have a shower seat somewhere Prior Function Level of Independence: Independent Communication Communication: No difficulties         Vision/Perception     Cognition  Cognition Arousal/Alertness: Awake/alert Behavior During Therapy: WFL for tasks assessed/performed Overall Cognitive Status: Within Functional Limits for tasks assessed    Extremity/Trunk Assessment Upper Extremity Assessment Upper Extremity Assessment: Overall WFL for tasks assessed Lower Extremity Assessment Lower Extremity Assessment: LLE deficits/detail LLE Deficits / Details: able to advance LLE during gait,      Mobility Bed Mobility Bed Mobility: Supine to Sit Supine to Sit: 3: Mod assist;HOB elevated Sit to Supine: 4: Min assist;With rail Details for Bed Mobility Assistance: assistance for LLE Transfers Sit to Stand: 4: Min assist;From chair/3-in-1;With upper extremity assist Stand to Sit: To chair/3-in-1;4: Min assist;With upper extremity assist Details for Transfer Assistance: cues for hand and leg placement     Exercise     Balance  End of Session OT - End of Session Activity Tolerance: Patient tolerated treatment well Patient left: in bed;with call bell/phone within reach;with family/visitor present  [place] 10/07/2013, 2:23 PM Lesle Chris, OTR/L [phone] 10/07/2013

## 2013-10-07 NOTE — Procedures (Signed)
ELECTROENCEPHALOGRAM REPORT   Patient: Anita Blake       Room #: [facility] 1616 EEG No. ID: [id number] Age: 66 y.o.        Sex: female Referring Physician: Alvan Dame Report Date:  10/07/2013        Interpreting Physician: Alexis Goodell D  History: Anita Blake is an 67 y.o. female with an episode of unresponsiveness s/p surgery  Medications:  Scheduled: . aspirin EC  325 mg Oral BID  . atorvastatin  10 mg Oral q1800  . celecoxib  200 mg Oral Q12H  . dexamethasone  10 mg Intravenous Once  . docusate sodium  100 mg Oral BID  . ferrous sulfate  325 mg Oral TID PC  . HYDROcodone-acetaminophen  1-2 tablet Oral Q4H  . polyethylene glycol  17 g Oral BID    Conditions of Recording:  This is a 16 channel EEG carried out with the patient in the awake, drowsy and asleep states.  Description:  The waking background activity consists of a low voltage, symmetrical, fairly well organized, 10 Hz alpha activity, seen from the parieto-occipital and posterior temporal regions.  Low voltage fast activity, poorly organized, is seen anteriorly and is at times superimposed on more posterior regions.  A mixture of theta and alpha rhythms are seen from the central and temporal regions. The patient drowses with slowing to irregular, low voltage theta and beta activity.   The patient goes in to a light sleep with symmetrical sleep spindles, vertex central sharp transients and irregular slow activity.  Hyperventilation and intermittent photic stimulation were not performed.  IMPRESSION: This is a normal electroencephalogram.  No epileptiform activity is noted.     Alexis Goodell, MD [facility] [phone] 10/07/2013, 3:48 PM

## 2013-10-07 NOTE — Evaluation (Signed)
Physical Therapy Evaluation Patient Details Name: Anita Blake MRN: [id number] DOB: Jan 16, 1946 Today's Date: 10/07/2013 Time: 1572-6203 PT Time Calculation (min): 24 min  PT Assessment / Plan / Recommendation History of Present Illness  S/p L direct anterior THA. Post op complications of degressed F/C, loss of muscle tone. Pt gradually improved. CT head  negative. Pt returned to normal state .  Clinical Impression  Pt tolerated ambulating in hall x 60 ft. Pt did c/o feeling nausea and light headed. BP 113/63, HR76, sats 96% RA. Pt will benefit from PT to address problems listed. Pt reports that pt has limitesd caregiver support at home as spouse will not be able to assist pt.    PT Assessment  Patient needs continued PT services    Follow Up Recommendations  SNF (will see how she progresses and update plan as needed.)    Does the patient have the potential to tolerate intense rehabilitation      Barriers to Discharge        Equipment Recommendations  Rolling walker with 5" wheels    Recommendations for Other Services     Frequency 7X/week    Precautions / Restrictions Precautions Precautions: Fall   Pertinent Vitals/Pain No c/o pain.      Mobility  Bed Mobility Bed Mobility: Supine to Sit Supine to Sit: 3: Mod assist;HOB elevated Details for Bed Mobility Assistance: asssitance with LLE getting to the edge  of the bed. Transfers Transfers: Sit to Stand;Stand to Sit Sit to Stand: 4: Min assist;From bed;From chair/3-in-1 Stand to Sit: To chair/3-in-1;4: Min assist;With upper extremity assist Details for Transfer Assistance: cues for UE support to stand and to sit, Cues for LLE position prior to sitting down. Ambulation/Gait Ambulation/Gait Assistance: 1: +2 Total assist (for safety) Ambulation/Gait: Patient Percentage: 90% Ambulation Distance (Feet): 60 Feet Assistive device: Rolling walker Ambulation/Gait Assistance Details: cues for sequence and posture, position  inside of RW. Gait Pattern: Step-through pattern;Antalgic    Exercises     PT Diagnosis: Difficulty walking  PT Problem List: Decreased strength;Decreased range of motion;Decreased activity tolerance;Decreased mobility;Decreased knowledge of use of DME;Decreased safety awareness PT Treatment Interventions: DME instruction;Gait training;Stair training;Functional mobility training;Therapeutic activities;Therapeutic exercise;Patient/family education     PT Goals(Current goals can be found in the care plan section) Acute Rehab PT Goals Patient Stated Goal: I want to walk.  PT Goal Formulation: With patient Time For Goal Achievement: 10/14/13 Potential to Achieve Goals: Good  Visit Information  Last PT Received On: 10/07/13 Assistance Needed: +2 (for safety due to post op complications.) History of Present Illness: S/p L direct anterior THA. Post op complications of degressed F/C, loss of muscle tone. Pt gradually improved. CT heat negative. Pt returned to normal state .       Prior [place] expects to be discharged to:: Private residence Living Arrangements: Spouse/significant other Available Help at Discharge: Family Type of Home: House Home Access: Stairs to enter Technical brewer of Steps: 2 Entrance Stairs-Rails: None Home Equipment: None Prior Function Level of Independence: Independent Communication Communication: No difficulties    Cognition  Cognition Arousal/Alertness: Awake/alert Behavior During Therapy: WFL for tasks assessed/performed Overall Cognitive Status: Within Functional Limits for tasks assessed    Extremity/Trunk Assessment Upper Extremity Assessment Upper Extremity Assessment: Defer to OT evaluation Lower Extremity Assessment Lower Extremity Assessment: LLE deficits/detail LLE Deficits / Details: able to advance LLE during gait,    Balance    End of Session PT - End of Session Activity  Tolerance: Patient  tolerated treatment well Patient left: in chair;with call bell/phone within reach Nurse Communication: Mobility status  GP     Claretha Cooper 10/07/2013, 2:11 PM

## 2013-10-07 NOTE — Progress Notes (Signed)
Physical Therapy Treatment Patient Details Name: Anita Blake MRN: [id number] DOB: 01-09-1946 Today's Date: 10/07/2013 Time: 6010-9323 PT Time Calculation (min): 17 min  PT Assessment / Plan / Recommendation  History of Present Illness S/p L direct anterior THA. Post op complications of degressed F/C, loss of muscle tone. Pt gradually improved. CT heat negative. Pt returned to normal state .   PT Comments   Pt reports that she feels she may be able to go home.  Follow Up Recommendations  SNF     Does the patient have the potential to tolerate intense rehabilitation     Barriers to Discharge        Equipment Recommendations  None recommended by PT    Recommendations for Other Services    Frequency 7X/week   Progress towards PT Goals Progress towards PT goals: Progressing toward goals  Plan Current plan remains appropriate    Precautions / Restrictions Precautions Precautions: Fall Restrictions Weight Bearing Restrictions: No   Pertinent Vitals/Pain Sore L thigh    Mobility      Exercises Total Joint Exercises Ankle Circles/Pumps: AROM;Both;10 reps;Supine Quad Sets: AROM;Both;10 reps;Supine Short Arc Quad: AROM;Left;10 reps;Supine Heel Slides: AAROM;Left;10 reps;Supine Hip ABduction/ADduction: AAROM;Left;10 reps;Supine   PT Diagnosis: Difficulty walking  PT Problem List: Decreased strength;Decreased range of motion;Decreased activity tolerance;Decreased mobility;Decreased knowledge of use of DME;Decreased safety awareness PT Treatment Interventions: DME instruction;Gait training;Stair training;Functional mobility training;Therapeutic activities;Therapeutic exercise;Patient/family education   PT Goals (current goals can now be found in the care plan section) Acute Rehab PT Goals Patient Stated Goal: I want to walk.  PT Goal Formulation: With patient Time For Goal Achievement: 10/14/13 Potential to Achieve Goals: Good  Visit Information  Last PT Received On:  10/07/13 Assistance Needed: +1 History of Present Illness: S/p L direct anterior THA. Post op complications of degressed F/C, loss of muscle tone. Pt gradually improved. CT heat negative. Pt returned to normal state .    Subjective Data  Patient Stated Goal: I want to walk.    Cognition  Cognition Arousal/Alertness: Awake/alert Behavior During Therapy: WFL for tasks assessed/performed Overall Cognitive Status: Within Functional Limits for tasks assessed    Balance     End of Session PT - End of Session Activity Tolerance: Patient tolerated treatment well Patient left: with call bell/phone within reach;in bed Nurse Communication: Mobility status   GP     Claretha Cooper 10/07/2013, 2:57 PM Tresa Endo PT [phone]

## 2013-10-08 LAB — CBC
Hemoglobin: 9.3 g/dL — ABNORMAL LOW (ref 12.0–15.0)
MCH: 25.7 pg — ABNORMAL LOW (ref 26.0–34.0)
MCHC: 31.4 g/dL (ref 30.0–36.0)
MCV: 81.8 fL (ref 78.0–100.0)
Platelets: 226 10*3/uL (ref 150–400)
RBC: 3.62 MIL/uL — ABNORMAL LOW (ref 3.87–5.11)
RDW: 14.8 % (ref 11.5–15.5)

## 2013-10-08 LAB — BASIC METABOLIC PANEL
BUN: 16 mg/dL (ref 6–23)
CO2: 28 mEq/L (ref 19–32)
Calcium: 9 mg/dL (ref 8.4–10.5)
Creatinine, Ser: 0.7 mg/dL (ref 0.50–1.10)
GFR calc Af Amer: >90 mL/min (ref >90)
GFR calc non Af Amer: 89 mL/min — ABNORMAL LOW (ref >90)
Glucose, Bld: 92 mg/dL (ref 70–99)

## 2013-10-08 MED ORDER — HYDROCODONE-ACETAMINOPHEN 5-325 MG PO TABS: 1.0000 | ORAL_TABLET | Freq: Four times a day (QID) | ORAL | Status: DC | PRN

## 2013-10-08 MED ORDER — METHOCARBAMOL 500 MG PO TABS: 500.0000 mg | ORAL_TABLET | Freq: Four times a day (QID) | ORAL | Status: DC | PRN

## 2013-10-08 MED ORDER — POLYETHYLENE GLYCOL 3350 17 G PO PACK: 17.0000 g | PACK | Freq: Two times a day (BID) | ORAL | Status: DC

## 2013-10-08 MED ORDER — FERROUS SULFATE 325 (65 FE) MG PO TABS: 325.0000 mg | ORAL_TABLET | Freq: Three times a day (TID) | ORAL | Status: DC

## 2013-10-08 MED ORDER — ASPIRIN 325 MG PO TBEC: 325.0000 mg | DELAYED_RELEASE_TABLET | Freq: Two times a day (BID) | ORAL | Status: AC

## 2013-10-08 MED ORDER — DSS 100 MG PO CAPS: 100.0000 mg | ORAL_CAPSULE | Freq: Two times a day (BID) | ORAL | Status: DC

## 2013-10-08 NOTE — Progress Notes (Signed)
   Subjective: 2 Days Post-Op Procedure(s) (LRB): LEFT TOTAL HIP ARTHROPLASTY ANTERIOR APPROACH (Left)   Patient reports pain as mild, pain well controlled. No events throughout the night. Neurology has signed off on the patient. Ready to be discharged home.  Objective:   VITALS:   Filed Vitals:   10/08/13 0517  BP: 115/64  Pulse: 71  Temp: 98.4 F (36.9 C)  Resp: 16    Neurovascular intact Dorsiflexion/Plantar flexion intact Incision: dressing C/D/I No cellulitis present Compartment soft  LABS  Recent Labs  10/07/13 0340 10/08/13 0448  HGB 10.0* 9.3*  HCT 30.7* 29.6*  WBC 8.7 8.8  PLT 241 226     Recent Labs  10/07/13 0340 10/08/13 0448  NA 136 138  K 4.0 3.9  BUN 9 16  CREATININE 0.63 0.70  GLUCOSE 136* 92     Assessment/Plan: 2 Days Post-Op Procedure(s) (LRB): LEFT TOTAL HIP ARTHROPLASTY ANTERIOR APPROACH (Left) Up with therapy Discharge home with home health Follow up in 2 weeks at [facility]. Follow up with OLIN,Cullan Launer D in 2 weeks.  Contact information:  [facility] [address], [place] [phone]    Expected ABLA  Treated with iron and will observe  Morbid Obesity (BMI >40)  Estimated body mass index is 44.66 kg/(m^2) as calculated from the following:   Height as of this encounter: 5' 3.5" (1.613 m).   Weight as of this encounter: 116.2 kg (256 lb 2.8 oz). Patient also counseled that weight may inhibit the healing process Patient counseled that losing weight will help with future health issues        West Pugh. Mada Sadik   PAC  10/08/2013, 7:37 AM

## 2013-10-08 NOTE — Care Management Note (Signed)
    Page 1 of 1   10/08/2013     4:10:34 PM   CARE MANAGEMENT NOTE 10/08/2013  Patient:  Anita Blake, Anita Blake   Account Number:  [id number]  Date Initiated:  10/08/2013  Documentation initiated by:  Sherrin Daisy  Subjective/Objective Assessment:   DX LEFT HIP REPLACEMNT     Action/Plan:   cm SPOKE WITH PATIENT. Plans are for patient to return to her home in [place] where spouse will be caregiver. Plans to use [facility] for [facility] services.  plans to get her DME from retail DME company.   Anticipated DC Date:  10/08/2013   Anticipated DC Plan:  [place]  CM consult      [facility] Choice  HOME HEALTH   Choice offered to / List presented to:  C-1 Patient        [place] arranged  HH-2 PT      [place]   Status of service:  Completed, signed off Medicare Important Message given?   (If response is "NO", the following Medicare IM given date fields will be blank) Date Medicare IM given:   Date Additional Medicare IM given:    Discharge Disposition:  [place]  Per UR Regulation:    If discussed at Long Length of Stay Meetings, dates discussed:    Comments:  10/08/2013 Sherrin Daisy BSN RN CCM [phone] [place] [facility] will provide HHpt services within 48hrs of discharge.

## 2013-10-08 NOTE — Progress Notes (Signed)
Physical Therapy Treatment Patient Details Name: Anita Blake MRN: [id number] DOB: 11/01/1946 Today's Date: 10/08/2013 Time: 4888-9169 PT Time Calculation (min): 15 min  PT Assessment / Plan / Recommendation  History of Present Illness S/p L direct anterior THA. Post op complications of degressed F/C, loss of muscle tone. Pt gradually improved. CT heat negative. Pt returned to normal state .   PT Comments   Pt eager to D/C to home.  Main focus of session was pt and family education on steps.  Pt plans to D/C to home today.    Follow Up Recommendations  Home health PT (Pt has decided to go home,  will consult LPT.)     Does the patient have the potential to tolerate intense rehabilitation     Barriers to Discharge        Equipment Recommendations       Recommendations for Other Services    Frequency     Progress towards PT Goals Progress towards PT goals: Progressing toward goals  Plan      Precautions / Restrictions Precautions Precautions: Fall Restrictions Weight Bearing Restrictions: No    Pertinent Vitals/Pain C/o "soreness" ICE applied    Mobility  Bed Mobility Bed Mobility: Not assessed Details for Bed Mobility Assistance: Pt OOB in recliner Transfers Transfers: Sit to Stand;Stand to Sit Sit to Stand: 6: Modified independent (Device/Increase time);From toilet;From chair/3-in-1 Stand to Sit: 6: Modified independent (Device/Increase time);To chair/3-in-1;To toilet Details for Transfer Assistance: good use of hands and safety tech Ambulation/Gait Ambulation/Gait Assistance: 5: Supervision;4: Min guard Ambulation Distance (Feet): 25 Feet Assistive device: Rolling walker Ambulation/Gait Assistance Details: increased time Gait Pattern: Step-through pattern;Antalgic Stairs: Yes (with spouse) Stairs Assistance: 4: [place] Details (indicate cue type and reason): educated and instructed spouse on proper tech up steps backward with RW 2nd "no  rails at home".  Handout also given. Stair Management Technique: Backwards;With walker Number of Stairs: 2     PT Goals (current goals can now be found in the care plan section)    Visit Information  Last PT Received On: 10/08/13 Assistance Needed: +1 History of Present Illness: S/p L direct anterior THA. Post op complications of degressed F/C, loss of muscle tone. Pt gradually improved. CT heat negative. Pt returned to normal state .    Subjective Data      Cognition       Balance     End of Session PT - End of Session Equipment Utilized During Treatment: Gait belt Activity Tolerance: Patient tolerated treatment well Patient left: with call bell/phone within reach;in chair   Rica Koyanagi  PTA [facility]  Acute  Rehab Pager      [phone]

## 2013-10-15 NOTE — Discharge Summary (Signed)
Physician Discharge Summary  Patient ID: Anita Blake MRN: [id number] DOB/AGE: May 20, 1946 67 y.o.  Admit date: 10/06/2013 Discharge date: 10/08/2013   Procedures:  Procedure(s) (LRB): LEFT TOTAL HIP ARTHROPLASTY ANTERIOR APPROACH (Left)  Attending Physician:  Dr. Paralee Cancel   Admission Diagnoses:   Left hip OA / pain  Discharge Diagnoses:  Principal Problem:   S/P left THA, AA Active Problems:   Altered mental status   Expected blood loss anemia   Morbid obesity  Past Medical History  Diagnosis Date  . Arthritis   . Anemia yrs ago  . Complication of anesthesia     slow to wake up    HPI: Anita Blake, 67 y.o. female, has a history of pain and functional disability in the left hip(s) due to trauma and patient has failed non-surgical conservative treatments for greater than 12 weeks to include NSAID's and/or analgesics, corticosteriod injections, use of assistive devices and activity modification. Onset of symptoms was gradual starting >10 years ago with gradually worsening course since that time.The patient noted no past surgery on the left hip(s). Patient currently rates pain in the left hip at 9 out of 10 with activity. Patient has night pain, worsening of pain with activity and weight bearing, trendelenberg gait, pain that interfers with activities of daily living and pain with passive range of motion. Patient has evidence of periarticular osteophytes and joint space narrowing by imaging studies. This condition presents safety issues increasing the risk of falls. There is no current active infection. Risks, benefits and expectations were discussed with the patient. Patient understand the risks, benefits and expectations and wishes to proceed with surgery.  PCP: No primary provider on file.   Discharged Condition: good  Hospital Course:  Patient underwent the above stated procedure on 10/06/2013. Patient tolerated the procedure well and brought to the recovery room in  good condition and subsequently to the floor.  POD #1 BP: 132/65 ; Pulse: 67 ; Temp: 98.6 F (37 C) ; Resp: 19  Pt's foley was removed, as well as the hemovac drain removed. IV was changed to a saline lock. Patient reports pain as mild, pain controlled. No events throughout the night. Has had no problems since the incident after surgery. Neurovascular intact, dorsiflexion/plantar flexion intact, incision: dressing C/Blake/I, no cellulitis present and compartment soft.   LABS  Basename    HGB  10.0  HCT  30.7   POD #2  BP: 115/64 ; Pulse: 71 ; Temp: 98.4 F (36.9 C) ; Resp: 16  Patient reports pain as mild, pain well controlled. No events throughout the night. Neurology has signed off on the patient. Ready to be discharged home. Neurovascular intact, dorsiflexion/plantar flexion intact, incision: dressing C/Blake/I, no cellulitis present and compartment soft.   LABS  Basename    HGB  9.3  HCT  29.6    Discharge Exam: General appearance: alert, cooperative and no distress Extremities: Homans sign is negative, no sign of DVT, no edema, redness or tenderness in the calves or thighs and no ulcers, gangrene or trophic changes  Disposition:    Home-Health Care Svc with follow up in 2 weeks   Follow up with Anita Blake in 2 weeks.  Contact information:  [facility] [address], [place] [phone]     Discharge Orders   Future Orders Complete By Expires   Call MD / Call 911  As directed    Comments:     If you experience  chest pain or shortness of breath, CALL 911 and be transported to the hospital emergency room.  If you develope a fever above 101 F, pus (white drainage) or increased drainage or redness at the wound, or calf pain, call your surgeon's office.   Change dressing  As directed    Comments:     Maintain surgical dressing for 10-14 days, then replace with 4x4 guaze and tape. Keep the area dry and clean.    Constipation Prevention  As directed    Comments:     Drink plenty of fluids.  Prune juice may be helpful.  You may use a stool softener, such as Colace (over the counter) 100 mg twice a day.  Use MiraLax (over the counter) for constipation as needed.   Diet - low sodium heart healthy  As directed    Discharge instructions  As directed    Comments:     Maintain surgical dressing for 10-14 days, then replace with gauze and tape. Keep the area dry and clean until follow up. Follow up in 2 weeks at [facility]. Call with any questions or concerns.   Driving restrictions  As directed    Comments:     No driving for 4 weeks   Increase activity slowly as tolerated  As directed    TED hose  As directed    Comments:     Use stockings (TED hose) for 2 weeks on both leg(s).  You may remove them at night for sleeping.   Weight bearing as tolerated  As directed    Questions:     Laterality:     Extremity:          Medication List    STOP taking these medications       ibuprofen 200 MG tablet  Commonly known as:  ADVIL,MOTRIN      TAKE these medications       aspirin 325 MG EC tablet  Take 1 tablet (325 mg total) by mouth 2 (two) times daily.     DSS 100 MG Caps  Take 100 mg by mouth 2 (two) times daily.     ferrous sulfate 325 (65 FE) MG tablet  Take 1 tablet (325 mg total) by mouth 3 (three) times daily after meals.     HYDROcodone-acetaminophen 5-325 MG per tablet  Commonly known as:  NORCO  Take 1 tablet by mouth every 6 (six) hours as needed for pain.     LIVALO 4 MG Tabs  Generic drug:  Pitavastatin Calcium  Take 0.5 tablets by mouth every evening.     magnesium 30 MG tablet  Take 30 mg by mouth daily.     methocarbamol 500 MG tablet  Commonly known as:  ROBAXIN  Take 1 tablet (500 mg total) by mouth every 6 (six) hours as needed (muscle spasms).     polyethylene glycol packet  Commonly known as:  MIRALAX / GLYCOLAX  Take 17 g by mouth 2 (two) times  daily.     Vitamin Blake 2000 UNITS tablet  Take 4,000 Units by mouth 2 (two) times daily.         Signed: [place] Blake. [name]   PAC  10/15/2013, 9:35 PM

## 2013-10-22 ENCOUNTER — Other Ambulatory Visit: Payer: Self-pay

## 2014-01-10 DIAGNOSIS — K449 Diaphragmatic hernia without obstruction or gangrene: Secondary | ICD-10-CM

## 2014-01-10 DIAGNOSIS — K219 Gastro-esophageal reflux disease without esophagitis: Secondary | ICD-10-CM | POA: Insufficient documentation

## 2014-01-10 DIAGNOSIS — R0989 Other specified symptoms and signs involving the circulatory and respiratory systems: Secondary | ICD-10-CM | POA: Insufficient documentation

## 2014-01-10 DIAGNOSIS — E785 Hyperlipidemia, unspecified: Secondary | ICD-10-CM | POA: Insufficient documentation

## 2014-01-10 DIAGNOSIS — I10 Essential (primary) hypertension: Secondary | ICD-10-CM | POA: Insufficient documentation

## 2014-01-11 ENCOUNTER — Encounter: Payer: Self-pay | Admitting: Physician Assistant

## 2014-01-11 ENCOUNTER — Other Ambulatory Visit: Payer: Self-pay | Admitting: Physician Assistant

## 2014-01-11 ENCOUNTER — Ambulatory Visit (INDEPENDENT_AMBULATORY_CARE_PROVIDER_SITE_OTHER): Payer: Medicare PPO | Admitting: Physician Assistant

## 2014-01-11 VITALS — BP 138/78 | HR 76 | Temp 98.2°F | Resp 16 | Ht 63.5 in | Wt 245.0 lb

## 2014-01-11 DIAGNOSIS — Z79899 Other long term (current) drug therapy: Secondary | ICD-10-CM

## 2014-01-11 DIAGNOSIS — E559 Vitamin D deficiency, unspecified: Secondary | ICD-10-CM | POA: Insufficient documentation

## 2014-01-11 DIAGNOSIS — E785 Hyperlipidemia, unspecified: Secondary | ICD-10-CM

## 2014-01-11 DIAGNOSIS — I10 Essential (primary) hypertension: Secondary | ICD-10-CM

## 2014-01-11 DIAGNOSIS — R7303 Prediabetes: Secondary | ICD-10-CM

## 2014-01-11 DIAGNOSIS — R0989 Other specified symptoms and signs involving the circulatory and respiratory systems: Secondary | ICD-10-CM

## 2014-01-11 DIAGNOSIS — E2839 Other primary ovarian failure: Secondary | ICD-10-CM

## 2014-01-11 LAB — BASIC METABOLIC PANEL WITH GFR
BUN: 15 mg/dL (ref 6–23)
CHLORIDE: 100 meq/L (ref 96–112)
CO2: 29 mEq/L (ref 19–32)
Calcium: 9.3 mg/dL (ref 8.4–10.5)
Creat: 0.63 mg/dL (ref 0.50–1.10)
GFR, Est African American: >89 mL/min
GLUCOSE: 67 mg/dL — AB (ref 70–99)
POTASSIUM: 3.9 meq/L (ref 3.5–5.3)
Sodium: 138 mEq/L (ref 135–145)

## 2014-01-11 LAB — LIPID PANEL
Cholesterol: 172 mg/dL (ref 0–200)
HDL: 52 mg/dL (ref >39)
LDL CALC: 100 mg/dL — AB (ref 0–99)
Total CHOL/HDL Ratio: 3.3 Ratio
Triglycerides: 100 mg/dL (ref <150)
VLDL: 20 mg/dL (ref 0–40)

## 2014-01-11 LAB — HEPATIC FUNCTION PANEL
ALT: 27 U/L (ref 0–35)
AST: 21 U/L (ref 0–37)
Albumin: 4.2 g/dL (ref 3.5–5.2)
Alkaline Phosphatase: 64 U/L (ref 39–117)
BILIRUBIN INDIRECT: 0.3 mg/dL (ref 0.0–0.9)
Bilirubin, Direct: 0.1 mg/dL (ref 0.0–0.3)
Total Bilirubin: 0.4 mg/dL (ref 0.3–1.2)
Total Protein: 7.4 g/dL (ref 6.0–8.3)

## 2014-01-11 LAB — MAGNESIUM: Magnesium: 1.8 mg/dL (ref 1.5–2.5)

## 2014-01-11 NOTE — Patient Instructions (Signed)
Bad carbs also include fruit juice, alcohol, and sweet tea. These are empty calories that do not signal to your brain that you are full.   Please remember the good carbs are still carbs which convert into sugar. So please measure them out no more than 1/2-1 cup of rice, oatmeal, pasta, and beans.  Veggies are however free foods! Pile them on.   I like lean protein at every meal such as chicken, [place], pork chops, cottage cheese, etc. Just do not fry these meats and please center your meal around vegetable, the meats should be a side dish.   No all fruit is created equal. Please see the list below, the fruit at the bottom is higher in sugars than the fruit at the top   Patellar Tendinitis, Jumper's Knee with Rehab Tendinitis is inflammation of a tendon. Tendonitis of the tendon below the kneecap (patella) is known as patellar tendonitis. Patellar tendonitis is a common cause of pain below the kneecap (infrapatella). Patellar tendonitis may involve a tear (strain) in the ligament. Strains are classified into three categories. Grade 1 strains cause pain, but the tendon is not lengthened. Grade 2 strains include a lengthened ligament, due to the ligament being stretched or partially ruptured. With grade 2 strains there is still function, although function may be decreased. Grade 3 strains involve a complete tear of the tendon or muscle, and function is usually impaired. Patellar tendon strains are usually grade 1 or 2.  SYMPTOMS   Pain, tenderness, swelling, warmth, or redness over the patellar tendon (just below the kneecap).  Pain and loss of strength (sometimes), with forcefully straightening the knee (especially when jumping or rising from a seated or squatting position), or bending the knee completely (squatting or kneeling).  Crackling sound (crepitation) when the tendon is moved or touched. CAUSES  Patellar tendonitis is caused by injury to the patellar tendon. The inflammation is the  body's healing response. Common causes of injury include:  Stress from a sudden increase in intensity, frequency, or duration of training.  Overuse of the quadriceps thigh muscles and patellar tendon.  Direct hit (trauma) to the knee or patellar tendon. RISK INCREASES WITH:  Sports that require sudden, explosive thigh muscle (quadriceps) contraction, such as jumping, quick starts, or kicking.  Running sports, especially running down hills.  Poor strength and flexibility of the thigh and knee.  Flat feet. PREVENTION  Warm up and stretch properly before activity.  Allow for adequate recovery between workouts.  Maintain physical fitness:  Strength, flexibility, and endurance.  Cardiovascular fitness.  Protect the knee joint with taping, protective strapping, bracing, or elastic compression bandage.  Wear arch supports (orthotics). PROGNOSIS  If treated properly, patellar tendonitis usually heals within 6 weeks.  RELATED COMPLICATIONS   Longer healing time, if not properly treated or if not given enough time to heal.  Recurring symptoms, if activity is resumed too soon, with overuse, with a direct blow, or when using poor technique.  If untreated, tendon rupture requiring surgery. TREATMENT Treatment first involves the use of ice and medicine, to reduce pain and inflammation. The use of strengthening and stretching exercises may help reduce pain with activity. These exercises may be performed at home or with a therapist. Serious cases of tendonitis may require restraining the knee for 10 to 14 days, to prevent stress on the tendon and to promote healing. Crutches may be used (uncommon) until you can walk without a limp. For cases in which non-surgical treatment is unsuccessful, surgery  may be advised, to remove the inflamed tendon lining (sheath). Surgery is rare, and is only advised after at least 6 months of non-surgical treatment. MEDICATION   If pain medicine is needed,  nonsteroidal anti-inflammatory medicines (aspirin and ibuprofen), or other minor pain relievers (acetaminophen), are often advised.  Do not take pain medicine for 7 days before surgery.  Prescription pain relievers may be given, if your caregiver thinks they are needed. Use only as directed and only as much as you need. HEAT AND COLD  Cold treatment (icing) should be applied for 10 to 15 minutes every 2 to 3 hours for inflammation and pain, and immediately after activity that aggravates your symptoms. Use ice packs or an ice massage.  Heat treatment may be used before performing stretching and strengthening activities prescribed by your caregiver, physical therapist, or athletic trainer. Use a heat pack or a warm water soak. SEEK MEDICAL CARE IF:  Symptoms get worse or do not improve in 2 weeks, despite treatment.  New, unexplained symptoms develop. (Drugs used in treatment may produce side effects.) EXERCISES RANGE OF MOTION (ROM) AND STRETCHING EXERCISES - Patellar Tendinitis (Jumper's Knee) These are some of the initial exercises with which you may start your rehabilitation program, until you see your caregiver again or until your symptoms are resolved. Remember:   Flexible tissue is more tolerant of the stresses placed on it during activities.  Each stretch should be held for 20 to 30 seconds.  A gentle stretching sensation should be felt. STRETCH Hamstrings, Supine  Lie on your back. Loop a belt or towel over the ball of your right / left foot.  Straighten your right / left knee and slowly pull on the belt to raise your leg. Do not allow the right / left knee to bend. Keep your opposite leg flat on the floor.  Raise the leg until you feel a gentle stretch behind your right / left knee or thigh. Hold this position for __________ seconds. Repeat __________ times. Complete this stretch __________ times per day.  STRETCH - Hamstrings, Doorway  Lie on your back with your right /  left leg extended and resting on the wall, and the opposite leg flat on the ground through the door. At first, position your bottom farther away from the wall.  Keep your right / left knee straight. If you feel a stretch behind your knee or thigh, hold this position for __________ seconds.  If you do not feel a stretch, scoot your bottom closer to the door, and hold __________ seconds. Repeat __________ times. Complete this stretch __________ times per day.  STRETCH - Hamstrings, Standing  Stand or sit and extend your right / left leg, placing your foot on a chair or foot stool.  Keep a slight arch in your low back and your hips straight forward.  Lead with your chest and lean forward at the waist until you feel a gentle stretch in the back of your right / left knee or thigh. (When done correctly, this exercise requires leaning only a small distance.)  Hold this position for __________ seconds. Repeat __________ times. Complete this stretch __________ times per day. STRETCH - Adductors, Lunge  While standing, spread your legs, with your right / left leg behind you.  Lean away from your right / left leg by bending your opposite knee. You may rest your hands on your thigh for balance.  You should feel a stretch in your right / left inner thigh. Hold for __________  seconds. Repeat __________ times. Complete this exercise __________ times per day.  STRENGTHENING EXERCISES - Patellar Tendinitis (Jumper's Knee) These exercises may help you when beginning to rehabilitate your injury. They may resolve your symptoms with or without further involvement from your physician, physical therapist or athletic trainer. While completing these exercises, remember:   Muscles can gain both the endurance and the strength needed for everyday activities through controlled exercises.  Complete these exercises as instructed by your physician, physical therapist or athletic trainer. Increase the resistance and  repetitions only as guided by your caregiver. STRENGTH - Quadriceps, Isometrics  Lie on your back with your right / left leg extended and your opposite knee bent.  Gradually tense the muscles in the front of your right / left thigh. You should see either your kneecap slide up toward your hip or increased dimpling just above the knee. This motion will push the back of the knee down toward the floor, mat, or bed on which you are lying.  Hold the muscle as tight as you can, without increasing your pain, for __________ seconds.  Relax the muscles slowly and completely in between each repetition. Repeat __________ times. Complete this exercise __________ times per day.  STRENGTH - Quadriceps, Short Arcs  Lie on your back. Place a __________ inch towel roll under your right / left knee, so that the knee bends slightly.  Raise only your lower leg by tightening the muscles in the front of your thigh. Do not allow your thigh to rise.  Hold this position for __________ seconds. Repeat __________ times. Complete this exercise __________ times per day.  OPTIONAL ANKLE WEIGHTS: Begin with ____________________, but DO NOT exceed ____________________. Increase in 1 pound/ 0.5 kilogram increments. STRENGTH - Quadriceps, Straight Leg Raises  Quality counts! Watch for signs that the quadriceps muscle is working, to be sure you are strengthening the correct muscles and not "cheating" by substituting with healthier muscles.  Lay on your back with your right / left leg extended and your opposite knee bent.  Tense the muscles in the front of your right / left thigh. You should see either your kneecap slide up or increased dimpling just above the knee. Your thigh may even shake a bit.  Tighten these muscles even more and raise your leg 4 to 6 inches off the floor. Hold for __________ seconds.  Keeping these muscles tense, lower your leg.  Relax the muscles slowly and completely between each  repetition. Repeat __________ times. Complete this exercise __________ times per day.  STRENGTH  Quadriceps, Squats  Stand in a door frame so that your feet and knees are in line with the frame.  Use your hands for balance, not support, on the frame.  Slowly lower your weight, bending at the hips and knees. Keep your lower legs upright so that they are parallel with the door frame. Squat only within the range that does not increase your knee pain. Never let your hips drop below your knees.  Slowly return upright, pushing with your legs, not pulling with your hands. Repeat __________ times. Complete this exercise __________ times per day.  STRENGTH  Quadriceps, Step-Downs  Stand on the edge of a step stool or stair. Be prepared to use a countertop or wall for balance, if needed.  Keeping your right / left knee directly over the middle of your foot, slowly touch your opposite heel to the floor or lower step. Do not go all the way to the floor if your knee  pain increases, just go as far as you can without increased discomfort. Use your right / left leg muscles, not gravity to lower your body weight.  Slowly push your body weight back up to the starting position, Repeat __________ times. Complete this exercise __________ times per day.  Document Released: 12/03/2005 Document Revised: 02/25/2012 Document Reviewed: 03/17/2009 [facility] Patient Information 2014 [place], [place].

## 2014-01-11 NOTE — Progress Notes (Signed)
Complete Physical HPI Patient presents for complete physical.   Patient's blood pressure has been controlled at home. Patient denies chest pain, shortness of breath, dizziness. BP: 138/78 mmHg  Patient's cholesterol is diet controlled.  The patient's cholesterol last visit was LDL 95 The patient has been working on diet and exercise for prediabetes, denies changes in vision, polys, and paresthesias. Last A1C in office was 6.3 Doing well after hip replacement 09/2013  Current Medications:     aspirin 81 MG tablet  Take 81 mg by mouth daily.     LIVALO 4 MG Tabs  Generic drug:  Pitavastatin Calcium  Take 4 mg by mouth daily.     magnesium 30 MG tablet  Take 30 mg by mouth daily.     VITAMIN D PO  Take 5,000 Int'l Units by mouth daily.        Health Maintenance:  Tetanus: 2006 Pneumovax: declines Flu vaccine: declines Zostavax:N/A Pap: 2014 neg (Dr. Carren Rang) MGM: 2014 negative DEXA: 2002 neg Colonoscopy: 2011 ([facility]) Due next year EGD: N/A  Allergies:  Allergies  Allergen Reactions  . Crestor [Rosuvastatin]   . Lopid [Gemfibrozil]   . Latex Rash   Medical History:  Past Medical History  Diagnosis Date  . Anemia yrs ago  . Complication of anesthesia     slow to wake up  . Labile hypertension   . Hyperlipidemia   . Arthritis   . GERD (gastroesophageal reflux disease)   . Prediabetes    Surgical History:  Past Surgical History  Procedure Laterality Date  . Abdominal hysterectomy    . Eye surgery Left 2007    macular hole in retina  . Cataract surgery Left   . Right arm elbow surgery      ligament repair  . Index finger surgery Right   . Total hip arthroplasty Left 10/06/2013    Procedure: LEFT TOTAL HIP ARTHROPLASTY ANTERIOR APPROACH;  Surgeon: Mauri Pole, MD;  Location: [facility];  Service: Orthopedics;  Laterality: Left;   Family History:  Family History  Problem Relation Age of Onset  . Heart disease Mother   . Heart attack Mother   . Stroke  Father   . Alzheimer's disease Other    Social History:  History   Social History  . Marital Status: Married    Spouse Name: N/A    Number of Children: N/A  . Years of Education: N/A   Occupational History  . Not on file.   Social History Main Topics  . Smoking status: Never Smoker   . Smokeless tobacco: Never Used  . Alcohol Use: No  . Drug Use: No  . Sexual Activity: Not on file   Other Topics Concern  . Not on file   Social History Narrative  . No narrative on file   ROS Constitutional: Denies weight loss/gain, headaches, insomnia, fatigue, night sweats, and change in appetite. Eyes: Yearly, wears glasses Nov 2014 DEE normal. Denies redness, blurred vision, diplopia, discharge, itchy, watery eyes.  ENT: + snoring Denies discharge, congestion, post nasal drip, sore throat, earache, hearing loss, dental pain, Tinnitus, Vertigo, Sinus pain Cardio: Denies chest pain, palpitations, irregular heartbeat, dyspnea, diaphoresis, orthopnea, PND, claudication, edema Respiratory: denies cough, dyspnea, pleurisy, hoarseness, wheezing.  Gastrointestinal: ([facility]) Neg Hep panel, [place] AB + steatosis 2012 Denies dysphagia, heartburn, pain, cramps, nausea, vomiting, bloating, diarrhea, constipation, hematemesis, melena, hematochezia, hemorrhoids Genitourinary: (Dr Carren Rang) Denies dysuria, frequency, urgency, nocturia, hesitancy, discharge, hematuria, flank pain Breast: Denies Breast lumps, nipple discharge, bleeding.  Musculoskeletal: Denies arthralgia, myalgia, stiffness, Jt. Swelling, pain, Skin: Denies pruritis, rash, hives,  acne, eczema, changing in skin lesion Neuro: Denies Weakness, tremor, incoordination, spasms, paresthesia, pain Psychiatric: Denies confusion, memory loss, sensory loss Endocrine: Denies change in weight, skin, hair change, nocturia, and paresthesia, Diabetic Denies Polys, visual blurring, hyper /hypo glycemic episodes.  Heme/Lymph: Denies Excessive bleeding, bruising,  enlarged lymph nodes  Physical Exam: Estimated body mass index is 42.71 kg/(m^2) as calculated from the following:   Height as of this encounter: 5' 3.5" (1.613 m).   Weight as of this encounter: 245 lb (111.131 kg). Filed Vitals:   01/11/14 1420  BP: 138/78  Pulse: 76  Temp: 98.2 F (36.8 C)  Resp: 16   General Appearance: Well nourished, in no apparent distress. Eyes: PERRLA, EOMs, conjunctiva no swelling or erythema, normal fundi and vessels. Sinuses: No Frontal/maxillary tenderness ENT/Mouth: Ext aud canals clear, normal light reflex with TMs without erythema, bulging.  Good dentition. No erythema, swelling, or exudate on post pharynx. Tonsils not swollen or erythematous. Hearing normal.  Neck: Supple, thyroid normal. No bruits Respiratory: Respiratory effort normal, BS equal bilaterally without rales, rhonchi, wheezing or stridor. Cardio: RRR without murmurs, rubs or gallops. Brisk peripheral pulses without edema.  Chest: symmetric, with normal excursions and percussion. Breasts:defer Abdomen: Soft, +BS, obese. Non tender, no guarding, rebound, hernias, masses, or organomegaly. .  Lymphatics: Non tender without lymphadenopathy.  Genitourinary: defer Musculoskeletal: Full ROM all peripheral extremities,5/5 strength, and normal gait. Skin: Warm, dry without rashes, lesions, ecchymosis.  Neuro: Cranial nerves intact, reflexes equal bilaterally. Normal muscle tone, no cerebellar symptoms. Sensation intact.  Psych: Awake and oriented X 3, normal affect, Insight and Judgment appropriate.   EKG: WNL no changes.  Assessment and Plan: Anemia- monitor  Labile hypertension- at goal  Hyperlipidemia- check lipids, continue diet,exericise  Arthritis- RICE  GERD (gastroesophageal reflux disease)- controlled diet  Prediabetes- check labs, cont diet and exercise  Right knee pain- RICE, exercise given.  DEXA  Discussed med's effects and SE's. Screening labs and tests as requested with  regular follow-up as recommended.   [name] 2:38 PM

## 2014-01-12 LAB — URINALYSIS, ROUTINE W REFLEX MICROSCOPIC
Bilirubin Urine: NEGATIVE
Glucose, UA: NEGATIVE mg/dL
Hgb urine dipstick: NEGATIVE
KETONES UR: NEGATIVE mg/dL
LEUKOCYTES UA: NEGATIVE
NITRITE: NEGATIVE
PH: 7.5 (ref 5.0–8.0)
Protein, ur: NEGATIVE mg/dL
Specific Gravity, Urine: 1.013 (ref 1.005–1.030)
UROBILINOGEN UA: 0.2 mg/dL (ref 0.0–1.0)

## 2014-01-12 LAB — CBC WITH DIFFERENTIAL/PLATELET
BASOS ABS: 0 10*3/uL (ref 0.0–0.1)
Basophils Relative: 0 % (ref 0–1)
Eosinophils Absolute: 0.2 10*3/uL (ref 0.0–0.7)
Eosinophils Relative: 2 % (ref 0–5)
HEMATOCRIT: 37 % (ref 36.0–46.0)
Hemoglobin: 11.8 g/dL — ABNORMAL LOW (ref 12.0–15.0)
LYMPHS PCT: 34 % (ref 12–46)
Lymphs Abs: 3 10*3/uL (ref 0.7–4.0)
MCH: 24.6 pg — ABNORMAL LOW (ref 26.0–34.0)
MCHC: 31.9 g/dL (ref 30.0–36.0)
MCV: 77.1 fL — ABNORMAL LOW (ref 78.0–100.0)
MONO ABS: 0.6 10*3/uL (ref 0.1–1.0)
MONOS PCT: 7 % (ref 3–12)
NEUTROS ABS: 5 10*3/uL (ref 1.7–7.7)
Neutrophils Relative %: 57 % (ref 43–77)
Platelets: 315 10*3/uL (ref 150–400)
RBC: 4.8 MIL/uL (ref 3.87–5.11)
RDW: 15 % (ref 11.5–15.5)
WBC: 8.8 10*3/uL (ref 4.0–10.5)

## 2014-01-12 LAB — VITAMIN B12: VITAMIN B 12: 369 pg/mL (ref 211–911)

## 2014-01-12 LAB — INSULIN, FASTING: INSULIN FASTING, SERUM: 15 u[IU]/mL (ref 3–28)

## 2014-01-12 LAB — IRON AND TIBC
%SAT: 9 % — ABNORMAL LOW (ref 20–55)
Iron: 33 ug/dL — ABNORMAL LOW (ref 42–145)
TIBC: 352 ug/dL (ref 250–470)
UIBC: 319 ug/dL (ref 125–400)

## 2014-01-12 LAB — TSH: TSH: 1.148 u[IU]/mL (ref 0.350–4.500)

## 2014-01-12 LAB — FERRITIN: FERRITIN: 87 ng/mL (ref 10–291)

## 2014-01-12 LAB — HEMOGLOBIN A1C
Hgb A1c MFr Bld: 6 % — ABNORMAL HIGH (ref <5.7)
Mean Plasma Glucose: 126 mg/dL — ABNORMAL HIGH (ref <117)

## 2014-01-12 LAB — MICROALBUMIN / CREATININE URINE RATIO
CREATININE, URINE: 80.5 mg/dL
MICROALB/CREAT RATIO: 6.6 mg/g (ref 0.0–30.0)
Microalb, Ur: 0.53 mg/dL (ref 0.00–1.89)

## 2014-01-12 LAB — VITAMIN D 25 HYDROXY (VIT D DEFICIENCY, FRACTURES): Vit D, 25-Hydroxy: 56 ng/mL (ref 30–89)

## 2014-02-22 ENCOUNTER — Other Ambulatory Visit: Payer: Self-pay | Admitting: Physician Assistant

## 2014-02-24 NOTE — Telephone Encounter (Signed)
LMOM TO CALL

## 2014-03-25 ENCOUNTER — Ambulatory Visit (INDEPENDENT_AMBULATORY_CARE_PROVIDER_SITE_OTHER): Payer: Medicare PPO | Admitting: Physician Assistant

## 2014-03-25 VITALS — BP 138/78 | HR 64 | Temp 98.1°F | Resp 16 | Ht 63.5 in | Wt 241.0 lb

## 2014-03-25 DIAGNOSIS — I10 Essential (primary) hypertension: Secondary | ICD-10-CM

## 2014-03-25 DIAGNOSIS — R0989 Other specified symptoms and signs involving the circulatory and respiratory systems: Secondary | ICD-10-CM

## 2014-03-25 DIAGNOSIS — E2839 Other primary ovarian failure: Secondary | ICD-10-CM

## 2014-03-25 DIAGNOSIS — Z79899 Other long term (current) drug therapy: Secondary | ICD-10-CM

## 2014-03-25 DIAGNOSIS — Z0000 Encounter for general adult medical examination without abnormal findings: Secondary | ICD-10-CM

## 2014-03-25 DIAGNOSIS — E785 Hyperlipidemia, unspecified: Secondary | ICD-10-CM

## 2014-03-25 DIAGNOSIS — R7303 Prediabetes: Secondary | ICD-10-CM

## 2014-03-25 DIAGNOSIS — Z1331 Encounter for screening for depression: Secondary | ICD-10-CM

## 2014-03-25 DIAGNOSIS — E559 Vitamin D deficiency, unspecified: Secondary | ICD-10-CM

## 2014-03-25 LAB — CBC WITH DIFFERENTIAL/PLATELET
BASOS PCT: 0 % (ref 0–1)
Basophils Absolute: 0 10*3/uL (ref 0.0–0.1)
Eosinophils Absolute: 0.2 10*3/uL (ref 0.0–0.7)
Eosinophils Relative: 2 % (ref 0–5)
HCT: 37.8 % (ref 36.0–46.0)
HEMOGLOBIN: 12.3 g/dL (ref 12.0–15.0)
LYMPHS PCT: 38 % (ref 12–46)
Lymphs Abs: 3 10*3/uL (ref 0.7–4.0)
MCH: 24.5 pg — ABNORMAL LOW (ref 26.0–34.0)
MCHC: 32.5 g/dL (ref 30.0–36.0)
MCV: 75.1 fL — ABNORMAL LOW (ref 78.0–100.0)
MONOS PCT: 7 % (ref 3–12)
Monocytes Absolute: 0.5 10*3/uL (ref 0.1–1.0)
NEUTROS ABS: 4.1 10*3/uL (ref 1.7–7.7)
Neutrophils Relative %: 53 % (ref 43–77)
Platelets: 361 10*3/uL (ref 150–400)
RBC: 5.03 MIL/uL (ref 3.87–5.11)
RDW: 16 % — AB (ref 11.5–15.5)
WBC: 7.8 10*3/uL (ref 4.0–10.5)

## 2014-03-25 LAB — HEPATIC FUNCTION PANEL
ALBUMIN: 4.1 g/dL (ref 3.5–5.2)
ALT: 31 U/L (ref 0–35)
AST: 29 U/L (ref 0–37)
Alkaline Phosphatase: 78 U/L (ref 39–117)
BILIRUBIN DIRECT: 0.1 mg/dL (ref 0.0–0.3)
BILIRUBIN TOTAL: 0.8 mg/dL (ref 0.2–1.2)
Indirect Bilirubin: 0.7 mg/dL (ref 0.2–1.2)
Total Protein: 7.7 g/dL (ref 6.0–8.3)

## 2014-03-25 LAB — BASIC METABOLIC PANEL WITH GFR
BUN: 19 mg/dL (ref 6–23)
CALCIUM: 9.7 mg/dL (ref 8.4–10.5)
CO2: 29 mEq/L (ref 19–32)
Chloride: 101 mEq/L (ref 96–112)
Creat: 0.63 mg/dL (ref 0.50–1.10)
GFR, Est African American: >89 mL/min
Glucose, Bld: 75 mg/dL (ref 70–99)
Potassium: 4.3 mEq/L (ref 3.5–5.3)
SODIUM: 139 meq/L (ref 135–145)

## 2014-03-25 LAB — HEMOGLOBIN A1C
HEMOGLOBIN A1C: 6.5 % — AB (ref <5.7)
Mean Plasma Glucose: 140 mg/dL — ABNORMAL HIGH (ref <117)

## 2014-03-25 LAB — LIPID PANEL
CHOL/HDL RATIO: 3.5 ratio
CHOLESTEROL: 183 mg/dL (ref 0–200)
HDL: 53 mg/dL (ref >39)
LDL Cholesterol: 110 mg/dL — ABNORMAL HIGH (ref 0–99)
TRIGLYCERIDES: 100 mg/dL (ref <150)
VLDL: 20 mg/dL (ref 0–40)

## 2014-03-25 LAB — TSH: TSH: 1.719 u[IU]/mL (ref 0.350–4.500)

## 2014-03-25 LAB — MAGNESIUM: Magnesium: 1.9 mg/dL (ref 1.5–2.5)

## 2014-03-25 NOTE — Progress Notes (Signed)
Subjective:   Anita Blake is a 68 y.o. female who presents for Medicare Annual Wellness Visit and 3 month follow up on hypertension, prediabetes, hyperlipidemia, vitamin D def.  Date of last medicare wellness visit is unknown.   Her blood pressure has been controlled at home, today their BP is BP: 138/78 mmHg She does workout, walking and loves to work in her garden. She denies chest pain, shortness of breath, dizziness.  She is on cholesterol medication and denies myalgias. Her cholesterol is at goal. The cholesterol last visit was:   Lab Results  Component Value Date   CHOL 172 01/11/2014   HDL 52 01/11/2014   LDLCALC 100* 01/11/2014   TRIG 100 01/11/2014   CHOLHDL 3.3 01/11/2014   She has been working on diet and exercise for prediabetes, and denies paresthesia of the feet, polydipsia, polyuria and visual disturbances. Last A1C in the office was:  Lab Results  Component Value Date   HGBA1C 6.0* 01/11/2014   Patient is on Vitamin D supplement. Wt Readings from Last 3 Encounters:  03/25/14 241 lb (109.317 kg)  01/11/14 245 lb (111.131 kg)  10/06/13 256 lb 2.8 oz (116.2 kg)  Obesity- she has been changing what she is eating, she is decreasing sugars, no sodas, sweets, etc. Daughter getting married in the fall.     Names of Other Physician/Practitioners you currently use: 1. [facility]- here for primary care 2. Dr. Kathrin Penner, eye doctor, goes today 3. Dr. Randol Kern, dentist, last visit 2 months ago  Patient Care Team: Mayme Genta, MD as Consulting Physician (Gastroenterology) Adam Phenix, MD as Consulting Physician (Gynecology) Mauri Pole, MD as Consulting Physician (Orthopedic Surgery)  Medication Review Current Outpatient Prescriptions on File Prior to Visit  Medication Sig Dispense Refill  . aspirin 81 MG tablet Take 81 mg by mouth daily.      . Cholecalciferol (VITAMIN D PO) Take 5,000 Int'l Units by mouth daily.      Marland Kitchen  LIVALO 4 MG TABS TAKE 1 TABLET NIGHTLY  30 tablet  0  . magnesium 30 MG tablet Take 30 mg by mouth daily.       No current facility-administered medications on file prior to visit.    Current Problems (verified) Patient Active Problem List   Diagnosis Date Noted  . Unspecified vitamin D deficiency 01/11/2014  . Labile hypertension   . Hyperlipidemia   . Arthritis   . GERD (gastroesophageal reflux disease)   . Prediabetes   . Expected blood loss anemia 10/07/2013  . Morbid obesity 10/07/2013  . S/P left THA, AA 10/06/2013  . Altered mental status 10/06/2013    Screening Tests Health Maintenance  Topic Date Due  . Colonoscopy  07/16/1998  . Zostavax  07/16/2008  . Mammogram  05/27/2013  . Pneumococcal Polysaccharide Vaccine Age 31 And Over  07/16/2013  . Influenza Vaccine  07/17/2014  . Tetanus/tdap  07/28/2015     Immunization History  Administered Date(s) Administered  . Td 07/27/2005   Preventative care: Tetanus: 2006  Pneumovax: declines  Flu vaccine: declines  Zostavax:N/A  Pap: 03/2014 neg (Dr. Carren Rang)  MGM: 2014 negative  DEXA: 2002 neg  DUE Colonoscopy: 2011 ([facility]) Due next year  EGD: N/A  History reviewed: allergies, current medications, past family history, past medical history, past social history, past surgical history and problem list  Risk Factors: Osteoporosis: postmenopausal estrogen deficiency and dietary calcium and/or vitamin D deficiency History of fracture in the past year: no  Tobacco History  Substance Use Topics  . Smoking status: Never Smoker   . Smokeless tobacco: Never Used  . Alcohol Use: No   She does not smoke.  Patient is not a former smoker. Are there smokers in your home (other than you)?  No  Alcohol Current alcohol use: none  Caffeine Current caffeine use: denies use  Exercise Exercise limitations: The patient has no exercise limitations. Current exercise: walking and yard work  Nutrition/Diet Current diet:  in general, a "healthy" diet    Cardiac risk factors: advanced age (older than 56 for men, 57 for women), dyslipidemia, hypertension and obesity (BMI >= 30 kg/m2).  Depression Screen (Note: if answer to either of the following is "Yes", a more complete depression screening is indicated)   Q1: Over the past two weeks, have you felt down, depressed or hopeless? No  Q2: Over the past two weeks, have you felt little interest or pleasure in doing things? No  Have you lost interest or pleasure in daily life? No  Do you often feel hopeless? No  Do you cry easily over simple problems? No  Activities of Daily Living In your present state of health, do you have any difficulty performing the following activities?:  Driving? No Managing money?  No Feeding yourself? No Getting from bed to chair? No Climbing a flight of stairs? No Preparing food and eating?: No Bathing or showering? No Getting dressed: No Getting to the toilet? No Using the toilet:No Moving around from place to place: No In the past year have you fallen or had a near fall?:No   Are you sexually active?  No  Do you have more than one partner?  No  Vision Difficulties: No  Hearing Difficulties: No Do you often ask people to speak up or repeat themselves? No Do you experience ringing or noises in your ears? No Do you have difficulty understanding soft or whispered voices? No  Cognition  Do you feel that you have a problem with memory?No  Do you often misplace items? No  Do you feel safe at home?  Yes  Advanced directives Does patient have a [place]? Yes Does patient have a Living Will? Yes   Objective:   Vision and hearing screens reviewed.   Blood pressure 138/78, pulse 64, temperature 98.1 F (36.7 C), resp. rate 16, height 5' 3.5" (1.613 m), weight 241 lb (109.317 kg). Body mass index is 42.02 kg/(m^2).  General appearance: alert, no distress, WD/WN,  female Cognitive Testing  Alert?  Yes  Normal Appearance?Yes  Oriented to person? Yes  Place? Yes   Time? Yes  Recall of three objects?  Yes  Can perform simple calculations? Yes  Displays appropriate judgment?Yes  Can read the correct time from a watch face?Yes  HEENT: normocephalic, sclerae anicteric, TMs pearly, nares patent, no discharge or erythema, pharynx normal Oral cavity: MMM, no lesions Neck: supple, no lymphadenopathy, no thyromegaly, no masses Heart: RRR, normal S1, S2, no murmurs Lungs: CTA bilaterally, no wheezes, rhonchi, or rales Abdomen: +bs, soft, obese non tender, non distended, no masses, no hepatomegaly, no splenomegaly Musculoskeletal: nontender, no swelling, no obvious deformity Extremities: no edema, no cyanosis, no clubbing Pulses: 2+ symmetric, upper and lower extremities, normal cap refill Neurological: alert, oriented x 3, CN2-12 intact, strength normal upper extremities and lower extremities, sensation normal throughout, DTRs 2+ throughout, no cerebellar signs, gait normal Psychiatric: normal affect, behavior normal, pleasant  Breast: defer Gyn: defer Rectal: defer   Assessment:  1. Labile hypertension - CBC with Differential - BASIC METABOLIC PANEL WITH GFR - Hepatic function panel - TSH  2. Prediabetes - Hemoglobin A1c - Insulin, fasting - HM DIABETES FOOT EXAM  3. Morbid obesity  4. Hyperlipidemia - Lipid panel  5. Unspecified vitamin D deficiency - Vit D  25 hydroxy (rtn osteoporosis monitoring)  6. Encounter for long-term (current) use of other medications - Magnesium  7. Estrogen deficiency - DG Bone Density; Future   Plan:   During the course of the visit the patient was educated and counseled about appropriate screening and preventive services including:    Pneumococcal vaccine   Influenza vaccine  Screening electrocardiogram  Screening mammography  Bone densitometry screening  Colorectal cancer screening  Diabetes screening  Glaucoma  screening  Nutrition counseling   Screening recommendations, referrals:  Vaccinations: Tdap vaccine due 2016 Influenza vaccine declined Pneumococcal vaccine declined Shingles vaccine declined Hep B vaccine not indicated  Nutrition assessed and recommended  Colonoscopy not indicated Mammogram due in June Pap smear not indicated Pelvic exam not indicated Recommended yearly ophthalmology/optometry visit for glaucoma screening and checkup Recommended yearly dental visit for hygiene and checkup Advanced directives - will bring in copies  Conditions/risks identified: BMI: Discussed weight loss, diet, and increase physical activity.  Increase physical activity: AHA recommends 150 minutes of physical activity a week.  Medications reviewed DEXA- ordered Diabetes is at goal, ACE/ARB therapy not indicated, only preDM, and BP controlled Urinary Incontinence is not an issue: discussed non pharmacology and pharmacology options.  Fall risk: low- discussed PT, home fall assessment, medications.   Medicare Attestation I have personally reviewed: The patient's medical and social history Their use of alcohol, tobacco or illicit drugs Their current medications and supplements The patient's functional ability including ADLs,fall risks, home safety risks, cognitive, and hearing and visual impairment Diet and physical activities Evidence for depression or mood disorders  The patient's weight, height, BMI, and visual acuity have been recorded in the chart.  I have made referrals, counseling, and provided education to the patient based on review of the above and I have provided the patient with a written personalized care plan for preventive services.     [name], PA-C   03/25/2014

## 2014-03-25 NOTE — Patient Instructions (Addendum)
Preheat oven to 573 degrees Slice washed cabbage into slices so it looks like discs I put parchment paper on cookie sheet Do a small amount of olive oil Do mrs Dash, and garlic powder or garlic rub Bake for 30 mins, and flip and bake for 30 mins  Preventative Care for Adults - Female      MAINTAIN REGULAR HEALTH EXAMS:  A routine yearly physical is a good way to check in with your primary care provider about your health and preventive screening. It is also an opportunity to share updates about your health and any concerns you have, and receive a thorough all-over exam.   Most health insurance companies pay for at least some preventative services.  Check with your health plan for specific coverages.  WHAT PREVENTATIVE SERVICES DO WOMEN NEED?  Adult women should have their weight and blood pressure checked regularly.   Women age 66 and older should have their cholesterol levels checked regularly.  Women should be screened for cervical cancer with a Pap smear and pelvic exam beginning at either age 79, or 3 years after they become sexually activity.    Breast cancer screening generally begins at age 8 with a mammogram and breast exam by your primary care provider.    Beginning at age 43 and continuing to age 29, women should be screened for colorectal cancer.  Certain people may need continued testing until age 75.  Updating vaccinations is part of preventative care.  Vaccinations help protect against diseases such as the flu.  Osteoporosis is a disease in which the bones lose minerals and strength as we age. Women ages 37 and over should discuss this with their caregivers, as should women after menopause who have other risk factors.  Lab tests are generally done as part of preventative care to screen for anemia and blood disorders, to screen for problems with the kidneys and liver, to screen for bladder problems, to check blood sugar, and to check your cholesterol level.  Preventative  services generally include counseling about diet, exercise, avoiding tobacco, drugs, excessive alcohol consumption, and sexually transmitted infections.    GENERAL RECOMMENDATIONS FOR GOOD HEALTH:  Healthy diet:  Eat a variety of foods, including fruit, vegetables, animal or vegetable protein, such as meat, fish, chicken, and eggs, or beans, lentils, tofu, and grains, such as rice.  Drink plenty of water daily.  Decrease saturated fat in the diet, avoid lots of red meat, processed foods, sweets, fast foods, and fried foods.  Exercise:  Aerobic exercise helps maintain good heart health. At least 30-40 minutes of moderate-intensity exercise is recommended. For example, a brisk walk that increases your heart rate and breathing. This should be done on most days of the week.   Find a type of exercise or a variety of exercises that you enjoy so that it becomes a part of your daily life.  Examples are running, walking, swimming, water aerobics, and biking.  For motivation and support, explore group exercise such as aerobic class, spin class, Zumba, Yoga,or  martial arts, etc.    Set exercise goals for yourself, such as a certain weight goal, walk or run in a race such as a 5k walk/run.  Speak to your primary care provider about exercise goals.  Disease prevention:  If you smoke or chew tobacco, find out from your caregiver how to quit. It can literally save your life, no matter how long you have been a tobacco user. If you do not use tobacco, never begin.  Maintain a healthy diet and normal weight. Increased weight leads to problems with blood pressure and diabetes.   The Body Mass Index or BMI is a way of measuring how much of your body is fat. Having a BMI above 27 increases the risk of heart disease, diabetes, hypertension, stroke and other problems related to obesity. Your caregiver can help determine your BMI and based on it develop an exercise and dietary program to help you achieve or  maintain this important measurement at a healthful level.  High blood pressure causes heart and blood vessel problems.  Persistent high blood pressure should be treated with medicine if weight loss and exercise do not work.   Fat and cholesterol leaves deposits in your arteries that can block them. This causes heart disease and vessel disease elsewhere in your body.  If your cholesterol is found to be high, or if you have heart disease or certain other medical conditions, then you may need to have your cholesterol monitored frequently and be treated with medication.   Ask if you should have a cardiac stress test if your history suggests this. A stress test is a test done on a treadmill that looks for heart disease. This test can find disease prior to there being a problem.  Menopause can be associated with physical symptoms and risks. Hormone replacement therapy is available to decrease these. You should talk to your caregiver about whether starting or continuing to take hormones is right for you.   Osteoporosis is a disease in which the bones lose minerals and strength as we age. This can result in serious bone fractures. Risk of osteoporosis can be identified using a bone density scan. Women ages 41 and over should discuss this with their caregivers, as should women after menopause who have other risk factors. Ask your caregiver whether you should be taking a calcium supplement and Vitamin D, to reduce the rate of osteoporosis.   Avoid drinking alcohol in excess (more than two drinks per day).  Avoid use of street drugs. Do not share needles with anyone. Ask for professional help if you need assistance or instructions on stopping the use of alcohol, cigarettes, and/or drugs.  Brush your teeth twice a day with fluoride toothpaste, and floss once a day. Good oral hygiene prevents tooth decay and gum disease. The problems can be painful, unattractive, and can cause other health problems. Visit your  dentist for a routine oral and dental check up and preventive care every 6-12 months.   Look at your skin regularly.  Use a mirror to look at your back. Notify your caregivers of changes in moles, especially if there are changes in shapes, colors, a size larger than a pencil eraser, an irregular border, or development of new moles.  Safety:  Use seatbelts 100% of the time, whether driving or as a passenger.  Use safety devices such as hearing protection if you work in environments with loud noise or significant background noise.  Use safety glasses when doing any work that could send debris in to the eyes.  Use a helmet if you ride a bike or motorcycle.  Use appropriate safety gear for contact sports.  Talk to your caregiver about gun safety.  Use sunscreen with a SPF (or skin protection factor) of 15 or greater.  Lighter skinned people are at a greater risk of skin cancer. Don't forget to also wear sunglasses in order to protect your eyes from too much damaging sunlight. Damaging sunlight can accelerate cataract  formation.   Practice safe sex. Use condoms. Condoms are used for birth control and to help reduce the spread of sexually transmitted infections (or STIs).  Some of the STIs are gonorrhea (the clap), chlamydia, syphilis, trichomonas, herpes, HPV (human papilloma virus) and HIV (human immunodeficiency virus) which causes AIDS. The herpes, HIV and HPV are viral illnesses that have no cure. These can result in disability, cancer and death.   Keep carbon monoxide and smoke detectors in your home functioning at all times. Change the batteries every 6 months or use a model that plugs into the wall.   Vaccinations:  Stay up to date with your tetanus shots and other required immunizations. You should have a booster for tetanus every 10 years. Be sure to get your flu shot every year, since 5%-20% of the [place] population comes down with the flu. The flu vaccine changes each year, so being vaccinated  once is not enough. Get your shot in the fall, before the flu season peaks.   Other vaccines to consider:  Human Papilloma Virus or HPV causes cancer of the cervix, and other infections that can be transmitted from person to person. There is a vaccine for HPV, and females should get immunized between the ages of 5 and 37. It requires a series of 3 shots.   Pneumococcal vaccine to protect against certain types of pneumonia.  This is normally recommended for adults age 105 or older.  However, adults younger than 66 years old with certain underlying conditions such as diabetes, heart or lung disease should also receive the vaccine.  Shingles vaccine to protect against Varicella Zoster if you are older than age 55, or younger than 66 years old with certain underlying illness.  Hepatitis A vaccine to protect against a form of infection of the liver by a virus acquired from food.  Hepatitis B vaccine to protect against a form of infection of the liver by a virus acquired from blood or body fluids, particularly if you work in health care.  If you plan to travel internationally, check with your local health department for specific vaccination recommendations.  Cancer Screening:  Breast cancer screening is essential to preventive care for women. All women age 25 and older should perform a breast self-exam every month. At age 72 and older, women should have their caregiver complete a breast exam each year. Women at ages 95 and older should have a mammogram (x-ray film) of the breasts. Your caregiver can discuss how often you need mammograms.    Cervical cancer screening includes taking a Pap smear (sample of cells examined under a microscope) from the cervix (end of the uterus). It also includes testing for HPV (Human Papilloma Virus, which can cause cervical cancer). Screening and a pelvic exam should begin at age 50, or 3 years after a woman becomes sexually active. Screening should occur every year, with  a Pap smear but no HPV testing, up to age 64. After age 89, you should have a Pap smear every 3 years with HPV testing, if no HPV was found previously.   Most routine colon cancer screening begins at the age of 9. On a yearly basis, doctors may provide special easy to use take-home tests to check for hidden blood in the stool. Sigmoidoscopy or colonoscopy can detect the earliest forms of colon cancer and is life saving. These tests use a small camera at the end of a tube to directly examine the colon. Speak to your caregiver about this at  age 27, when routine screening begins (and is repeated every 5 years unless early forms of pre-cancerous polyps or small growths are found).

## 2014-03-26 LAB — INSULIN, FASTING: Insulin fasting, serum: 17 u[IU]/mL (ref 3–28)

## 2014-03-26 LAB — VITAMIN D 25 HYDROXY (VIT D DEFICIENCY, FRACTURES): VIT D 25 HYDROXY: 77 ng/mL (ref 30–89)

## 2014-05-18 ENCOUNTER — Other Ambulatory Visit: Payer: Self-pay | Admitting: Emergency Medicine

## 2014-06-29 ENCOUNTER — Encounter: Payer: Self-pay | Admitting: Physician Assistant

## 2014-06-29 ENCOUNTER — Ambulatory Visit (INDEPENDENT_AMBULATORY_CARE_PROVIDER_SITE_OTHER): Payer: Medicare PPO | Admitting: Physician Assistant

## 2014-06-29 VITALS — BP 140/80 | HR 68 | Temp 98.3°F | Resp 16 | Wt 243.0 lb

## 2014-06-29 DIAGNOSIS — R7309 Other abnormal glucose: Secondary | ICD-10-CM

## 2014-06-29 DIAGNOSIS — E785 Hyperlipidemia, unspecified: Secondary | ICD-10-CM

## 2014-06-29 DIAGNOSIS — Z79899 Other long term (current) drug therapy: Secondary | ICD-10-CM

## 2014-06-29 DIAGNOSIS — E559 Vitamin D deficiency, unspecified: Secondary | ICD-10-CM

## 2014-06-29 DIAGNOSIS — R0989 Other specified symptoms and signs involving the circulatory and respiratory systems: Secondary | ICD-10-CM

## 2014-06-29 DIAGNOSIS — R7303 Prediabetes: Secondary | ICD-10-CM

## 2014-06-29 DIAGNOSIS — I10 Essential (primary) hypertension: Secondary | ICD-10-CM

## 2014-06-29 LAB — CBC WITH DIFFERENTIAL/PLATELET
Basophils Absolute: 0 10*3/uL (ref 0.0–0.1)
Basophils Relative: 0 % (ref 0–1)
EOS PCT: 3 % (ref 0–5)
Eosinophils Absolute: 0.2 10*3/uL (ref 0.0–0.7)
HEMATOCRIT: 38.2 % (ref 36.0–46.0)
Hemoglobin: 12.3 g/dL (ref 12.0–15.0)
LYMPHS ABS: 2.5 10*3/uL (ref 0.7–4.0)
LYMPHS PCT: 35 % (ref 12–46)
MCH: 24.8 pg — ABNORMAL LOW (ref 26.0–34.0)
MCHC: 32.2 g/dL (ref 30.0–36.0)
MCV: 77 fL — ABNORMAL LOW (ref 78.0–100.0)
Monocytes Absolute: 0.5 10*3/uL (ref 0.1–1.0)
Monocytes Relative: 7 % (ref 3–12)
NEUTROS ABS: 3.9 10*3/uL (ref 1.7–7.7)
Neutrophils Relative %: 55 % (ref 43–77)
PLATELETS: 321 10*3/uL (ref 150–400)
RBC: 4.96 MIL/uL (ref 3.87–5.11)
RDW: 15.8 % — ABNORMAL HIGH (ref 11.5–15.5)
WBC: 7 10*3/uL (ref 4.0–10.5)

## 2014-06-29 LAB — BASIC METABOLIC PANEL WITH GFR
BUN: 16 mg/dL (ref 6–23)
CHLORIDE: 101 meq/L (ref 96–112)
CO2: 32 meq/L (ref 19–32)
CREATININE: 0.73 mg/dL (ref 0.50–1.10)
Calcium: 9.4 mg/dL (ref 8.4–10.5)
GFR, EST NON AFRICAN AMERICAN: 87 mL/min
GFR, Est African American: >89 mL/min
Glucose, Bld: 82 mg/dL (ref 70–99)
POTASSIUM: 4.4 meq/L (ref 3.5–5.3)
Sodium: 142 mEq/L (ref 135–145)

## 2014-06-29 LAB — HEPATIC FUNCTION PANEL
ALBUMIN: 4.1 g/dL (ref 3.5–5.2)
ALT: 24 U/L (ref 0–35)
AST: 26 U/L (ref 0–37)
Alkaline Phosphatase: 56 U/L (ref 39–117)
BILIRUBIN INDIRECT: 0.4 mg/dL (ref 0.2–1.2)
BILIRUBIN TOTAL: 0.5 mg/dL (ref 0.2–1.2)
Bilirubin, Direct: 0.1 mg/dL (ref 0.0–0.3)
Total Protein: 7.1 g/dL (ref 6.0–8.3)

## 2014-06-29 LAB — HEMOGLOBIN A1C
Hgb A1c MFr Bld: 6 % — ABNORMAL HIGH (ref <5.7)
Mean Plasma Glucose: 126 mg/dL — ABNORMAL HIGH (ref <117)

## 2014-06-29 LAB — LIPID PANEL
CHOL/HDL RATIO: 3 ratio
Cholesterol: 186 mg/dL (ref 0–200)
HDL: 61 mg/dL (ref >39)
LDL Cholesterol: 100 mg/dL — ABNORMAL HIGH (ref 0–99)
Triglycerides: 127 mg/dL (ref <150)
VLDL: 25 mg/dL (ref 0–40)

## 2014-06-29 LAB — MAGNESIUM: Magnesium: 1.7 mg/dL (ref 1.5–2.5)

## 2014-06-29 LAB — VITAMIN D 25 HYDROXY (VIT D DEFICIENCY, FRACTURES): VIT D 25 HYDROXY: 60 ng/mL (ref 30–89)

## 2014-06-29 LAB — TSH: TSH: 1.781 u[IU]/mL (ref 0.350–4.500)

## 2014-06-29 NOTE — Progress Notes (Signed)
Assessment and Plan:  Hypertension: Continue medication, monitor blood pressure at home. Continue DASH diet. Cholesterol: Continue diet and exercise. Check cholesterol.  diabetes-Continue diet and exercise. Check A1C- keep food diary and follow up in 1 month.  Vitamin D Def- check level and continue medications.   Continue diet and meds as discussed. Further disposition pending results of labs.  HPI 68 y.o. female  presents for 3 month follow up with hypertension, hyperlipidemia, diabetes x 02/2014 and vitamin D. Her blood pressure has been controlled at home, today their BP is BP: 140/80 mmHg She does workout. She denies chest pain, shortness of breath, dizziness.  She is on cholesterol medication and denies myalgias. Her cholesterol is at goal. The cholesterol last visit was:   Lab Results  Component Value Date   CHOL 183 03/25/2014   HDL 53 03/25/2014   LDLCALC 110* 03/25/2014   TRIG 100 03/25/2014   CHOLHDL 3.5 03/25/2014   She has been working on diet and exercise for diabetes, and denies paresthesia of the feet, polydipsia and polyuria. Last A1C in the office was:  Lab Results  Component Value Date   HGBA1C 6.5* 03/25/2014   Patient is on Vitamin D supplement.   Lab Results  Component Value Date   VD25OH 55 03/25/2014     Very frustrated with weight, has wedding for daughter in Nov, has been eating very well.   Drinks just water, and may have 1 diet coke Oatmeal 1/2 cup Salad with fruit, balsalmic, grilled chicken Thin wheats and peanut butter Meat and veggie at night  Lab Results  Component Value Date   TSH 1.719 03/25/2014    Current Medications:  Current Outpatient Prescriptions on File Prior to Visit  Medication Sig Dispense Refill  . aspirin 81 MG tablet Take 81 mg by mouth daily.      . Cholecalciferol (VITAMIN D PO) Take 5,000 Int'l Units by mouth daily.      Marland Kitchen LIVALO 4 MG TABS TAKE 1 TABLET BY MOUTH NIGHTLY  30 tablet  3  . magnesium 30 MG tablet Take 30 mg by mouth  daily.       No current facility-administered medications on file prior to visit.   Medical History:  Past Medical History  Diagnosis Date  . Anemia yrs ago  . Complication of anesthesia     slow to wake up  . Labile hypertension   . Hyperlipidemia   . Arthritis   . GERD (gastroesophageal reflux disease)   . Prediabetes   . Nonalcoholic steatohepatitis (NASH)   . Vitamin D deficiency    Allergies:  Allergies  Allergen Reactions  . Crestor [Rosuvastatin]   . Lopid [Gemfibrozil]   . Latex Rash     Review of Systems: [X]  = complains of  [ ]  = denies  General: Fatigue [ ]  Fever [ ]  Chills [ ]  Weakness [ ]   Insomnia [ ]  Eyes: Redness [ ]  Blurred vision [ ]  Diplopia [ ]   ENT: Congestion [ ]  Sinus Pain [ ]  Post Nasal Drip [ ]  Sore Throat [ ]  Earache [ ]   Cardiac: Chest pain/pressure [ ]  SOB [ ]  Orthopnea [ ]   Palpitations [ ]   Paroxysmal nocturnal dyspnea[ ]  Claudication [ ]  Edema [ ]   Pulmonary: Cough [ ]  Wheezing[ ]   SOB [ ]   Snoring [ ]   GI: Nausea [ ]  Vomiting[ ]  Dysphagia[ ]  Heartburn[ ]  Abdominal pain [ ]  Constipation [ ] ; Diarrhea [ ] ; BRBPR [ ]  Melena[ ]  GU:  Hematuria[ ]  Dysuria [ ]  Nocturia[ ]  Urgency [ ]   Hesitancy [ ]  Discharge [ ]  Neuro: Headaches[ ]  Vertigo[ ]  Paresthesias[ ]  Spasm [ ]  Speech changes [ ]  Incoordination [ ]   Ortho: Arthritis [ ]  Joint pain [ ]  Muscle pain [ ]  Joint swelling [ ]  Back Pain [ ]  Skin:  Rash [ ]   Pruritis [ ]  Change in skin lesion [ ]   Psych: Depression[ ]  Anxiety[ ]  Confusion [ ]  Memory loss [ ]   Heme/Lypmh: Bleeding [ ]  Bruising [ ]  Enlarged lymph nodes [ ]   Endocrine: Visual blurring [ ]  Paresthesia [ ]  Polyuria [ ]  Polydypsea [ ]    Heat/cold intolerance [ ]  Hypoglycemia [ ]   Family history- Review and unchanged Social history- Review and unchanged Physical Exam: BP 140/80  Pulse 68  Temp(Src) 98.3 F (36.8 C)  Resp 16  Wt 243 lb (110.224 kg) Wt Readings from Last 3 Encounters:  06/29/14 243 lb (110.224 kg)  03/25/14 241 lb  (109.317 kg)  01/11/14 245 lb (111.131 kg)   General Appearance: Well nourished, in no apparent distress. Eyes: PERRLA, EOMs, conjunctiva no swelling or erythema Sinuses: No Frontal/maxillary tenderness ENT/Mouth: Ext aud canals clear, TMs without erythema, bulging. No erythema, swelling, or exudate on post pharynx.  Tonsils not swollen or erythematous. Hearing normal.  Neck: Supple, thyroid normal.  Respiratory: Respiratory effort normal, BS equal bilaterally without rales, rhonchi, wheezing or stridor.  Cardio: RRR with no MRGs. Brisk peripheral pulses without edema.  Abdomen: Soft, + BS.  Non tender, no guarding, rebound, hernias, masses. Lymphatics: Non tender without lymphadenopathy.  Musculoskeletal: Full ROM, 5/5 strength, normal gait.  Skin: Warm, dry without rashes, lesions, ecchymosis.  Neuro: Cranial nerves intact. Normal muscle tone, no cerebellar symptoms. Sensation intact.  Psych: Awake and oriented X 3, normal affect, Insight and Judgment appropriate.    Anita Blake 9:38 AM

## 2014-06-29 NOTE — Patient Instructions (Signed)
We want weight loss that will last so you should lose 1-2 pounds a week.  THAT IS IT! Please pick THREE things a month to change. Once it is a habit check off the item. Then pick another three items off the list to become habits.  If you are already doing a habit on the list GREAT!  Cross that item off! o Don't drink your calories. Ie, alcohol, soda, fruit juice, and sweet tea.  o Drink more water. Drink a glass when you feel hungry or before each meal. 64oz o Eat breakfast - Complex carb and protein (likeDannon light and fit yogurt, oatmeal, fruit, eggs, [place] bacon). o Measure your cereal.  Eat no more than one cup a day. (ie [place]) o Eat an apple a day. o Add a vegetable a day. o Try a new vegetable a month. o Use Pam! Stop using oil or butter to cook. o Don't finish your plate or use smaller plates. o Share your dessert. o Eat sugar free Jello for dessert or frozen grapes. o Don't eat 2-3 hours before bed. o Switch to whole wheat bread, pasta, and brown rice. o Make healthier choices when you eat out. No fries! o Pick baked chicken, NOT fried. o Don't forget to SLOW DOWN when you eat. It is not going anywhere.  o Take the stairs. o Park far away in the parking lot o [employer] (or weights) for 10 minutes while watching TV. o Walk at work for 10 minutes during break. o Walk outside 1 time a week with your friend, kids, dog, or significant other. o Start a walking group at [place] the mall as much as you can tolerate.  o Keep a food diary. o Weigh yourself daily. o Walk for 15 minutes 3 days per week. o Cook at home more often and eat out less.  If life happens and you go back to old habits, it is okay.  Just start over. You can do it!   If you experience chest pain, get short of breath, or tired during the exercise, please stop immediately and inform your doctor.

## 2014-06-30 LAB — INSULIN, FASTING: Insulin fasting, serum: 20 u[IU]/mL (ref 3–28)

## 2014-08-04 ENCOUNTER — Ambulatory Visit (INDEPENDENT_AMBULATORY_CARE_PROVIDER_SITE_OTHER): Payer: Medicare PPO | Admitting: Internal Medicine

## 2014-08-04 ENCOUNTER — Encounter: Payer: Self-pay | Admitting: Internal Medicine

## 2014-08-04 VITALS — BP 144/82 | HR 68 | Temp 98.1°F | Resp 16 | Ht 63.5 in | Wt 244.4 lb

## 2014-08-04 DIAGNOSIS — R0989 Other specified symptoms and signs involving the circulatory and respiratory systems: Secondary | ICD-10-CM

## 2014-08-04 DIAGNOSIS — E559 Vitamin D deficiency, unspecified: Secondary | ICD-10-CM

## 2014-08-04 DIAGNOSIS — R7309 Other abnormal glucose: Secondary | ICD-10-CM

## 2014-08-04 DIAGNOSIS — R7303 Prediabetes: Secondary | ICD-10-CM

## 2014-08-04 DIAGNOSIS — I10 Essential (primary) hypertension: Secondary | ICD-10-CM

## 2014-08-04 NOTE — Patient Instructions (Signed)

## 2014-08-04 NOTE — Progress Notes (Signed)
   Subjective:    Patient ID: Anita Blake, female    DOB: 1946/09/02, 68 y.o.   MRN: [id number]  HPI Patient returns for 1 month f/u of recent labs showing improvement of A1c from 6.5% to 6.0%.  Lipid profile showed very favorable  Parameters : Chol 186, TG 127, HDL 61 and LDL 100. Patient does have Morbid Obesity (BMI 42,7) and is working on her diet.   Medication List   aspirin 81 MG tablet     LIVALO 4 MG Tabs     magnesium 30 MG tablet     VITAMIN D Take 5,000 Int'l Units daily  .     Allergies  Allergen Reactions  . Anesthetics, Halogenated     Difficulty with waking up from anesthesia from [facility], had aphasia, negative CT  . Crestor [Rosuvastatin]   . Lopid [Gemfibrozil]   . Latex Rash    Past Medical History  Diagnosis Date  . Anemia yrs ago  . Complication of anesthesia     slow to wake up  . Labile hypertension   . Hyperlipidemia   . Arthritis   . GERD (gastroesophageal reflux disease)   . Prediabetes   . Nonalcoholic steatohepatitis (NASH)   . Vitamin D deficiency    Review of Systems In addition to the HPI above,  No Fever-chills,  No Headache, No changes with Vision or hearing,  No problems swallowing food or Liquids,  No Chest pain or productive Cough or Shortness of Breath,  No Abdominal pain, No Nausea or Vomitting, Bowel movements are regular,  No Blood in stool or Urine,  No dysuria,  No new skin rashes or bruises,  No new joints pains-aches,  No new weakness, tingling, numbness in any extremity,  No recent weight loss,  No polyuria, polydypsia or polyphagia,  No significant Mental Stressors.  A full 10 point Review of Systems was done, except as stated above, all other Review of Systems were negative  Objective:   Physical Exam BP 144/82  P 68  T 98.1 F   Resp 16  Ht 5' 3.5"   Wt 244 lb 6.4 oz   BMI 42.61 kg/m2  HEENT - Eac's patent. TM's Nl. EOM's full. PERRLA. NasoOroPharynx clear. Neck - supple. Nl Thyroid. Car 2+ & No bruits,  nodes, JVD Chest - Clear equal BS w/o Rales. Cor - Nl HS. RRR w/o sig MGR. Marland Kitchen MS- FROM w/o deformities. Muscle power, tone and bulk Nl. Gait Nl. Neuro - No obvious Cr N abn. Sensory, motor and Cerebellar functions appear Nl. Psyche - Mental status normal & appropriate.  Assessment & Plan:   1. Labile hypertension  2. Prediabetes  3. Vitamin D deficiency  4. Morbid obesity  Diabetic teaching today as well as dietary approaches & recc Dr Lear Ng book "The End of Dieting ... Of Diabetes" .Recent favorable labs were reviewed with patient.

## 2014-10-26 ENCOUNTER — Encounter: Payer: Self-pay | Admitting: Physician Assistant

## 2014-11-16 ENCOUNTER — Ambulatory Visit: Payer: Self-pay | Admitting: Physician Assistant

## 2014-12-23 ENCOUNTER — Ambulatory Visit: Payer: Self-pay | Admitting: Physician Assistant

## 2014-12-28 ENCOUNTER — Other Ambulatory Visit: Payer: Self-pay | Admitting: Emergency Medicine

## 2015-01-11 ENCOUNTER — Encounter: Payer: Self-pay | Admitting: Physician Assistant

## 2015-01-27 ENCOUNTER — Encounter: Payer: Self-pay | Admitting: Physician Assistant

## 2015-01-27 ENCOUNTER — Ambulatory Visit (INDEPENDENT_AMBULATORY_CARE_PROVIDER_SITE_OTHER): Payer: Medicare PPO | Admitting: Physician Assistant

## 2015-01-27 VITALS — BP 140/82 | HR 80 | Temp 98.2°F | Resp 18 | Ht 63.5 in | Wt 241.0 lb

## 2015-01-27 DIAGNOSIS — Z79899 Other long term (current) drug therapy: Secondary | ICD-10-CM

## 2015-01-27 DIAGNOSIS — I10 Essential (primary) hypertension: Secondary | ICD-10-CM | POA: Diagnosis not present

## 2015-01-27 DIAGNOSIS — R7309 Other abnormal glucose: Secondary | ICD-10-CM

## 2015-01-27 DIAGNOSIS — K2100 Gastro-esophageal reflux disease with esophagitis, without bleeding: Secondary | ICD-10-CM

## 2015-01-27 DIAGNOSIS — R0989 Other specified symptoms and signs involving the circulatory and respiratory systems: Secondary | ICD-10-CM

## 2015-01-27 DIAGNOSIS — E785 Hyperlipidemia, unspecified: Secondary | ICD-10-CM | POA: Diagnosis not present

## 2015-01-27 DIAGNOSIS — R7303 Prediabetes: Secondary | ICD-10-CM

## 2015-01-27 DIAGNOSIS — E559 Vitamin D deficiency, unspecified: Secondary | ICD-10-CM

## 2015-01-27 LAB — CBC WITH DIFFERENTIAL/PLATELET
Basophils Absolute: 0 10*3/uL (ref 0.0–0.1)
Basophils Relative: 0 % (ref 0–1)
EOS PCT: 2 % (ref 0–5)
Eosinophils Absolute: 0.2 10*3/uL (ref 0.0–0.7)
HEMATOCRIT: 37 % (ref 36.0–46.0)
HEMOGLOBIN: 11.9 g/dL — AB (ref 12.0–15.0)
LYMPHS ABS: 2.9 10*3/uL (ref 0.7–4.0)
LYMPHS PCT: 35 % (ref 12–46)
MCH: 25.2 pg — ABNORMAL LOW (ref 26.0–34.0)
MCHC: 32.2 g/dL (ref 30.0–36.0)
MCV: 78.2 fL (ref 78.0–100.0)
MONO ABS: 0.6 10*3/uL (ref 0.1–1.0)
MONOS PCT: 7 % (ref 3–12)
MPV: 9.8 fL (ref 8.6–12.4)
NEUTROS ABS: 4.7 10*3/uL (ref 1.7–7.7)
Neutrophils Relative %: 56 % (ref 43–77)
Platelets: 310 10*3/uL (ref 150–400)
RBC: 4.73 MIL/uL (ref 3.87–5.11)
RDW: 15.5 % (ref 11.5–15.5)
WBC: 8.4 10*3/uL (ref 4.0–10.5)

## 2015-01-27 NOTE — Patient Instructions (Signed)
Before you even begin to attack a weight-loss plan, it pays to remember this: You are not fat. You have fat. Losing weight isn't about blame or shame; it's simply another achievement to accomplish. Dieting is like any other skill-you have to buckle down and work at it. As long as you act in a smart, reasonable way, you'll ultimately get where you want to be. Here are some weight loss pearls for you.  1. It's Not a Diet. It's a Lifestyle Thinking of a diet as something you're on and suffering through only for the short term doesn't work. To shed weight and keep it off, you need to make permanent changes to the way you eat. It's OK to indulge occasionally, of course, but if you cut calories temporarily and then revert to your old way of eating, you'll gain back the weight quicker than you can say yo-yo. Use it to lose it. Research shows that one of the best predictors of long-term weight loss is how many pounds you drop in the first month. For that reason, nutritionists often suggest being stricter for the first two weeks of your new eating strategy to build momentum. Cut out added sugar and alcohol and avoid unrefined carbs. After that, figure out how you can reincorporate them in a way that's healthy and maintainable.  2. There's a Right Way to Exercise Working out burns calories and fat and boosts your metabolism by building muscle. But those trying to lose weight are notorious for overestimating the number of calories they burn and underestimating the amount they take in. Unfortunately, your system is biologically programmed to hold on to extra pounds and that means when you start exercising, your body senses the deficit and ramps up its hunger signals. If you're not diligent, you'll eat everything you burn and then some. Use it to lose it. Cardio gets all the exercise glory, but strength and interval training are the real heroes. They help you build lean muscle, which in turn increases your metabolism and  calorie-burning ability 3. Don't Overreact to Mild Hunger Some people have a hard time losing weight because of hunger anxiety. To them, being hungry is bad-something to be avoided at all costs-so they carry snacks with them and eat when they don't need to. Others eat because they're stressed out or bored. While you never want to get to the point of being ravenous (that's when bingeing is likely to happen), a hunger pang, a craving, or the fact that it's 3:00 p.m. should not send you racing for the vending machine or obsessing about the energy bar in your purse. Ideally, you should put off eating until your stomach is growling and it's difficult to concentrate.  Use it to lose it. When you feel the urge to eat, use the HALT method. Ask yourself, Am I really hungry? Or am I angry or anxious, lonely or bored, or tired? If you're still not certain, try the apple test. If you're truly hungry, an apple should seem delicious; if it doesn't, something else is going on. Or you can try drinking water and making yourself busy, if you are still hungry try a healthy snack.  4. Not All Calories Are Created Equal The mechanics of weight loss are pretty simple: Take in fewer calories than you use for energy. But the kind of food you eat makes all the difference. Processed food that's high in saturated fat and refined starch or sugar can cause inflammation that disrupts the hormone signals that tell  your brain you're full. The result: You eat a lot more.  Use it to lose it. Clean up your diet. Swap in whole, unprocessed foods, including vegetables, lean protein, and healthy fats that will fill you up and give you the biggest nutritional bang for your calorie buck. In a few weeks, as your brain starts receiving regular hunger and fullness signals once again, you'll notice that you feel less hungry overall and naturally start cutting back on the amount you eat.  5. Protein, Produce, and Plant-Based Fats Are Your Weight-Loss  [place] Here's why eating the three Ps regularly will help you drop pounds. Protein fills you up. You need it to build lean muscle, which keeps your metabolism humming so that you can torch more fat. People in a weight-loss program who ate double the recommended daily allowance for protein (about 110 grams for a 150-pound woman) lost 70 percent of their weight from fat, while people who ate the RDA lost only about 40 percent, one study found. Produce is packed with filling fiber. "It's very difficult to consume too many calories if you're eating a lot of vegetables. Example: Three cups of broccoli is a lot of food, yet only 93 calories. (Fruit is another story. It can be easy to overeat and can contain a lot of calories from sugar, so be sure to monitor your intake.) Plant-based fats like olive oil and those in avocados and nuts are healthy and extra satiating.  Use it to lose it. Aim to incorporate each of the three Ps into every meal and snack. People who eat protein throughout the day are able to keep weight off, according to a study in the [facility]. In addition to meat, poultry and seafood, good sources are beans, lentils, eggs, tofu, and yogurt. As for fat, keep portion sizes in check by measuring out salad dressing, oil, and nut butters (shoot for one to two tablespoons). Finally, eat veggies or a little fruit at every meal. People who did that consumed 308 fewer calories but didn't feel any hungrier than when they didn't eat more produce.  7. How You Eat Is As Important As What You Eat In order for your brain to register that you're full, you need to focus on what you're eating. Sit down whenever you eat, preferably at a table. Turn off the TV or computer, put down your phone, and look at your food. Smell it. Chew slowly, and don't put another bite on your fork until you swallow. When women ate lunch this attentively, they consumed 30 percent less when snacking later than  those who listened to an audiobook at lunchtime, according to a study in the [place] of [employer]. 8. Weighing Yourself Really Works The scale provides the best evidence about whether your efforts are paying off. Seeing the numbers tick up or down or stagnate is motivation to keep going-or to rethink your approach. A 2015 study at [facility] found that daily weigh-ins helped people lose more weight, keep it off, and maintain that loss, even after two years. Use it to lose it. Step on the scale at the same time every day for the best results. If your weight shoots up several pounds from one weigh-in to the next, don't freak out. Eating a lot of salt the night before or having your period is the likely culprit. The number should return to normal in a day or two. It's a steady climb that you need to do something about.  9. Too Much Stress and Too Little Sleep Are Your Enemies When you're tired and frazzled, your body cranks up the production of cortisol, the stress hormone that can cause carb cravings. Not getting enough sleep also boosts your levels of ghrelin, a hormone associated with hunger, while suppressing leptin, a hormone that signals fullness and satiety. People on a diet who slept only five and a half hours a night for two weeks lost 55 percent less fat and were hungrier than those who slept eight and a half hours, according to a study in the [place]. Use it to lose it. Prioritize sleep, aiming for seven hours or more a night, which research shows helps lower stress. And make sure you're getting quality zzz's. If a snoring spouse or a fidgety cat wakes you up frequently throughout the night, you may end up getting the equivalent of just four hours of sleep, according to a study from [facility]. Keep pets out of the bedroom, and use a white-noise app to drown out snoring. 10. You Will Hit a plateau-And You Can Bust Through It As you slim down, your  body releases much less leptin, the fullness hormone.  If you're not strength training, start right now. Building muscle can raise your metabolism to help you overcome a plateau. To keep your body challenged and burning calories, incorporate new moves and more intense intervals into your workouts or add another sweat session to your weekly routine. Alternatively, cut an extra 100 calories or so a day from your diet. Now that you've lost weight, your body simply doesn't need as much fuel.   Diabetes is a very complicated disease...lets simplify it.  An easy way to look at it to understand the complications is if you think of the extra sugar floating in your blood stream as glass shards floating through your blood stream.    Diabetes affects your small vessels first: 1) The glass shards (sugar) scraps down the tiny blood vessels in your eyes and lead to diabetic retinopathy, the leading cause of blindness in the [place]. Diabetes is the leading cause of newly diagnosed adult (29 to 67 years of age) blindness in the [place].  2) The glass shards scratches down the tiny vessels of your legs leading to nerve damage called neuropathy and can lead to amputations of your feet. More than 60% of all non-traumatic amputations of lower limbs occur in people with diabetes.  3) Over time the small vessels in your brain are shredded and closed off, individually this does not cause any problems but over a long period of time many of the small vessels being blocked can lead to Vascular Dementia.   4) Your kidney's are a filter system and have a "net" that keeps certain things in the body and lets bad things out. Sugar shreds this net and leads to kidney damage and eventually failure. Decreasing the sugar that is destroying the net and certain blood pressure medications can help stop or decrease progression of kidney disease. Diabetes was the primary cause of kidney failure in 44 percent of all new cases in 2011.  5)  Diabetes also destroys the small vessels in your penis that lead to erectile dysfunction. Eventually the vessels are so damaged that you may not be responsive to cialis or viagra.   Diabetes and your large vessels: Your larger vessels consist of your coronary arteries in your heart and the carotid vessels to your brain. Diabetes or even increased sugars  put you at 300% increased risk of heart attack and stroke and this is why.. The sugar scrapes down your large blood vessels and your body sees this as an internal injury and tries to repair itself. Just like you get a scab on your skin, your platelets will stick to the blood vessel wall trying to heal it. This is why we have diabetics on low dose aspirin daily, this prevents the platelets from sticking and can prevent plaque formation. In addition, your body takes cholesterol and tries to shove it into the open wound. This is why we want your LDL, or bad cholesterol, below 70.   The combination of platelets and cholesterol over 5-10 years forms plaque that can break off and cause a heart attack or stroke.   PLEASE REMEMBER:  Diabetes is preventable! Up to 46 percent of complications and morbidities among individuals with type 2 diabetes can be prevented, delayed, or effectively treated and minimized with regular visits to a health professional, appropriate monitoring and medication, and a healthy diet and lifestyle.

## 2015-01-27 NOTE — Progress Notes (Signed)
Assessment and Plan:  Hypertension: Continue medication, monitor blood pressure at home. Continue DASH diet.  Reminder to go to the ER if any CP, SOB, nausea, dizziness, severe HA, changes vision/speech, left arm numbness and tingling and jaw pain. Cholesterol: Continue diet and exercise. Check cholesterol.  Diabetes without complications-Continue diet and exercise. Check A1C Vitamin D Def- check level and continue medications. Obesity with co morbidities- long discussion about weight loss, diet, and exercise   Continue diet and meds as discussed. Further disposition pending results of labs. Discussed med's effects and SE's.   Future Appointments Date Time Provider [place]  03/07/2015 2:00 PM [name], PA-C [id number] None    HPI 68 y.o. female  presents for 3 month follow up with hypertension, hyperlipidemia, diabetes and vitamin D.  Her blood pressure has been controlled at home, today their BP is BP: 140/82 mmHg.  She does workout. She denies chest pain, shortness of breath, dizziness.  She is on cholesterol medication and denies myalgias. Her cholesterol is not at goal. The cholesterol was:  06/29/2014: Cholesterol, Total 186; HDL Cholesterol by NMR 61; LDL (calc) 100*; Triglycerides 127  She has been working on diet and exercise for diabetes without complications, she is on bASA, she is not on ACE/ARB, and denies  paresthesia of the feet, polydipsia, polyuria and visual disturbances. Last A1C was: 06/29/2014: Hemoglobin-A1c 6.0*  Patient is on Vitamin D supplement. 06/29/2014: Vit D, 25-Hydroxy 60  BMI is Body mass index is 42.02 kg/(m^2)., she is working on diet and exercise, she has lost 3 lbs, and states she has not eaten well, was on 15 day [place] cruise. Has been stressed with husband being ill, had lost 60lbs, husband does not come here. He can no longer drive and so she has to drive him, she is planning on retiring in May to help him.  Wt Readings from Last 3  Encounters:  01/27/15 241 lb (109.317 kg)  08/04/14 244 lb 6.4 oz (110.859 kg)  06/29/14 243 lb (110.224 kg)    Current Medications:  Current Outpatient Prescriptions on File Prior to Visit  Medication Sig Dispense Refill  . aspirin 81 MG tablet Take 81 mg by mouth daily.    . Cholecalciferol (VITAMIN D PO) Take 5,000 Int'l Units by mouth daily.    Marland Kitchen LIVALO 4 MG TABS TAKE 1 TABLET BY MOUTH NIGHTLY 30 tablet 3  . magnesium 30 MG tablet Take 30 mg by mouth daily.     No current facility-administered medications on file prior to visit.   Medical History:  Past Medical History  Diagnosis Date  . Anemia yrs ago  . Complication of anesthesia     slow to wake up  . Labile hypertension   . Hyperlipidemia   . Arthritis   . GERD (gastroesophageal reflux disease)   . Prediabetes   . Nonalcoholic steatohepatitis (NASH)   . Vitamin D deficiency    Allergies:  Allergies  Allergen Reactions  . Anesthetics, Halogenated     Difficulty with waking up from anesthesia from [facility], had aphasia, negative CT  . Crestor [Rosuvastatin]   . Lopid [Gemfibrozil]   . Latex Rash     Review of Systems:  Review of Systems  Constitutional: Negative.   HENT: Negative.   Eyes: Negative.   Respiratory: Negative.   Cardiovascular: Negative.   Gastrointestinal: Negative.   Genitourinary: Negative.   Musculoskeletal: Negative.   Skin: Negative.   Neurological: Negative.   Endo/Heme/Allergies: Negative.   Psychiatric/Behavioral: Negative.  Family history- Review and unchanged Social history- Review and unchanged Physical Exam: BP 140/82 mmHg  Pulse 80  Temp(Src) 98.2 F (36.8 C) (Temporal)  Resp 18  Ht 5' 3.5" (1.613 m)  Wt 241 lb (109.317 kg)  BMI 42.02 kg/m2 Wt Readings from Last 3 Encounters:  01/27/15 241 lb (109.317 kg)  08/04/14 244 lb 6.4 oz (110.859 kg)  06/29/14 243 lb (110.224 kg)   General Appearance: Well nourished, in no apparent distress. Eyes: PERRLA, EOMs, conjunctiva  no swelling or erythema Sinuses: No Frontal/maxillary tenderness ENT/Mouth: Ext aud canals clear, TMs without erythema, bulging. No erythema, swelling, or exudate on post pharynx.  Tonsils not swollen or erythematous. Hearing normal.  Neck: Supple, thyroid normal.  Respiratory: Respiratory effort normal, BS equal bilaterally without rales, rhonchi, wheezing or stridor.  Cardio: RRR with no MRGs. Brisk peripheral pulses without edema.  Abdomen: Soft, + BS.  Non tender, no guarding, rebound, hernias, masses. Lymphatics: Non tender without lymphadenopathy.  Musculoskeletal: Full ROM, 5/5 strength, Normal gait Skin: Warm, dry without rashes, lesions, ecchymosis.  Neuro: Cranial nerves intact. No cerebellar symptoms.  Psych: Awake and oriented X 3, normal affect, Insight and Judgment appropriate.    [name], PA-C 3:43 PM [facility]

## 2015-01-28 LAB — BASIC METABOLIC PANEL WITH GFR
BUN: 18 mg/dL (ref 6–23)
CO2: 27 mEq/L (ref 19–32)
Calcium: 9.6 mg/dL (ref 8.4–10.5)
Chloride: 101 mEq/L (ref 96–112)
Creat: 0.65 mg/dL (ref 0.50–1.10)
GFR, Est African American: >89 mL/min
GFR, Est Non African American: >89 mL/min
GLUCOSE: 84 mg/dL (ref 70–99)
POTASSIUM: 4.1 meq/L (ref 3.5–5.3)
Sodium: 141 mEq/L (ref 135–145)

## 2015-01-28 LAB — HEMOGLOBIN A1C
HEMOGLOBIN A1C: 6.1 % — AB (ref <5.7)
MEAN PLASMA GLUCOSE: 128 mg/dL — AB (ref <117)

## 2015-01-28 LAB — LIPID PANEL
CHOL/HDL RATIO: 2.8 ratio
Cholesterol: 170 mg/dL (ref 0–200)
HDL: 60 mg/dL (ref >39)
LDL Cholesterol: 95 mg/dL (ref 0–99)
TRIGLYCERIDES: 74 mg/dL (ref <150)
VLDL: 15 mg/dL (ref 0–40)

## 2015-01-28 LAB — HEPATIC FUNCTION PANEL
ALK PHOS: 56 U/L (ref 39–117)
ALT: 26 U/L (ref 0–35)
AST: 23 U/L (ref 0–37)
Albumin: 4 g/dL (ref 3.5–5.2)
BILIRUBIN TOTAL: 0.5 mg/dL (ref 0.2–1.2)
Bilirubin, Direct: 0.1 mg/dL (ref 0.0–0.3)
Indirect Bilirubin: 0.4 mg/dL (ref 0.2–1.2)
Total Protein: 7.3 g/dL (ref 6.0–8.3)

## 2015-01-28 LAB — VITAMIN D 25 HYDROXY (VIT D DEFICIENCY, FRACTURES): VIT D 25 HYDROXY: 56 ng/mL (ref 30–100)

## 2015-01-28 LAB — INSULIN, FASTING: Insulin fasting, serum: 8.1 u[IU]/mL (ref 2.0–19.6)

## 2015-01-28 LAB — MAGNESIUM: Magnesium: 1.8 mg/dL (ref 1.5–2.5)

## 2015-01-28 LAB — TSH: TSH: 1.293 u[IU]/mL (ref 0.350–4.500)

## 2015-03-07 ENCOUNTER — Encounter: Payer: Self-pay | Admitting: Physician Assistant

## 2015-03-10 DIAGNOSIS — H5310 Unspecified subjective visual disturbances: Secondary | ICD-10-CM | POA: Diagnosis not present

## 2015-03-10 DIAGNOSIS — H04122 Dry eye syndrome of left lacrimal gland: Secondary | ICD-10-CM | POA: Diagnosis not present

## 2015-04-07 DIAGNOSIS — H04123 Dry eye syndrome of bilateral lacrimal glands: Secondary | ICD-10-CM | POA: Diagnosis not present

## 2015-04-07 DIAGNOSIS — H25811 Combined forms of age-related cataract, right eye: Secondary | ICD-10-CM | POA: Diagnosis not present

## 2015-04-07 DIAGNOSIS — H52203 Unspecified astigmatism, bilateral: Secondary | ICD-10-CM | POA: Diagnosis not present

## 2015-04-07 DIAGNOSIS — H43811 Vitreous degeneration, right eye: Secondary | ICD-10-CM | POA: Diagnosis not present

## 2015-05-10 ENCOUNTER — Encounter: Payer: Self-pay | Admitting: Physician Assistant

## 2015-05-10 ENCOUNTER — Ambulatory Visit (INDEPENDENT_AMBULATORY_CARE_PROVIDER_SITE_OTHER): Payer: Medicare PPO | Admitting: Physician Assistant

## 2015-05-10 VITALS — BP 138/80 | HR 56 | Temp 97.7°F | Resp 16 | Ht 63.5 in | Wt 244.0 lb

## 2015-05-10 DIAGNOSIS — Z23 Encounter for immunization: Secondary | ICD-10-CM

## 2015-05-10 DIAGNOSIS — I10 Essential (primary) hypertension: Secondary | ICD-10-CM | POA: Diagnosis not present

## 2015-05-10 DIAGNOSIS — R6889 Other general symptoms and signs: Secondary | ICD-10-CM | POA: Diagnosis not present

## 2015-05-10 DIAGNOSIS — Z79899 Other long term (current) drug therapy: Secondary | ICD-10-CM

## 2015-05-10 DIAGNOSIS — Z96649 Presence of unspecified artificial hip joint: Secondary | ICD-10-CM

## 2015-05-10 DIAGNOSIS — K2100 Gastro-esophageal reflux disease with esophagitis, without bleeding: Secondary | ICD-10-CM

## 2015-05-10 DIAGNOSIS — Z0001 Encounter for general adult medical examination with abnormal findings: Secondary | ICD-10-CM | POA: Diagnosis not present

## 2015-05-10 DIAGNOSIS — Z0000 Encounter for general adult medical examination without abnormal findings: Secondary | ICD-10-CM

## 2015-05-10 DIAGNOSIS — R0989 Other specified symptoms and signs involving the circulatory and respiratory systems: Secondary | ICD-10-CM

## 2015-05-10 DIAGNOSIS — E785 Hyperlipidemia, unspecified: Secondary | ICD-10-CM

## 2015-05-10 DIAGNOSIS — K7581 Nonalcoholic steatohepatitis (NASH): Secondary | ICD-10-CM

## 2015-05-10 DIAGNOSIS — E559 Vitamin D deficiency, unspecified: Secondary | ICD-10-CM

## 2015-05-10 DIAGNOSIS — D2261 Melanocytic nevi of right upper limb, including shoulder: Secondary | ICD-10-CM

## 2015-05-10 DIAGNOSIS — R7303 Prediabetes: Secondary | ICD-10-CM

## 2015-05-10 DIAGNOSIS — Z1331 Encounter for screening for depression: Secondary | ICD-10-CM

## 2015-05-10 DIAGNOSIS — R7309 Other abnormal glucose: Secondary | ICD-10-CM | POA: Diagnosis not present

## 2015-05-10 DIAGNOSIS — Z9181 History of falling: Secondary | ICD-10-CM

## 2015-05-10 LAB — CBC WITH DIFFERENTIAL/PLATELET
BASOS ABS: 0 10*3/uL (ref 0.0–0.1)
Basophils Relative: 0 % (ref 0–1)
Eosinophils Absolute: 0.1 10*3/uL (ref 0.0–0.7)
Eosinophils Relative: 2 % (ref 0–5)
HCT: 37.7 % (ref 36.0–46.0)
Hemoglobin: 12.1 g/dL (ref 12.0–15.0)
LYMPHS ABS: 2.8 10*3/uL (ref 0.7–4.0)
Lymphocytes Relative: 39 % (ref 12–46)
MCH: 25.7 pg — AB (ref 26.0–34.0)
MCHC: 32.1 g/dL (ref 30.0–36.0)
MCV: 80.2 fL (ref 78.0–100.0)
MONO ABS: 0.4 10*3/uL (ref 0.1–1.0)
MONOS PCT: 6 % (ref 3–12)
MPV: 9.9 fL (ref 8.6–12.4)
NEUTROS ABS: 3.8 10*3/uL (ref 1.7–7.7)
Neutrophils Relative %: 53 % (ref 43–77)
PLATELETS: 288 10*3/uL (ref 150–400)
RBC: 4.7 MIL/uL (ref 3.87–5.11)
RDW: 15.2 % (ref 11.5–15.5)
WBC: 7.1 10*3/uL (ref 4.0–10.5)

## 2015-05-10 LAB — HEMOGLOBIN A1C
HEMOGLOBIN A1C: 6.3 % — AB (ref <5.7)
Mean Plasma Glucose: 134 mg/dL — ABNORMAL HIGH (ref <117)

## 2015-05-10 NOTE — Patient Instructions (Signed)
[facility]  7 a.m.-6:30 p.m., Monday 7 a.m.-5 p.m., Tuesday-Friday Schedule an appointment by calling [phone].  [facility] Schedule an appointment by calling [phone].  Please call Dr. Earlean Shawl for your colonoscopy or we can do the cologuard  Cologuard is an easy to use noninvasive colon cancer screening test based on the latest advances in stool DNA science.   Colon cancer is 3rd most diagnosed cancer and 2nd leading cause of death in both men and women 36 years of age and older despite being one of the most preventable and treatable cancers if found early. You have agreed to do a Cologuard screening and have declined a colonoscopy in spite of being explained the risks and benefits of the colonoscopy in detail, including cancer and death. Please understand that this is test not as sensitive or specific as a colonoscopy and you are still recommended to get a colonoscopy.   If you are NOT medicare please call your insurance company and given them this CPT code, [phone], in order to see how much your insurance company will cover or you can call [phone] to talk with Cologuard about pricing and coverage.   You will receive a short call from [place] support center at [employer], when you receive a call they will say they are from [place],  to confirm your mailing address and give you more information.  When they calll you, it will appear on the caller ID as "[employer]" or in some cases only this number will appear, [phone].   [employer] will ship your collection kit directly to you. You will collect a single stool sample in the privacy of your own home, no special preparation required. You will return the kit via [facility] pre-paid shipping or pick-up, in the same box it arrived in. Then I will contact you to discuss your results after I receive them from the laboratory.   If you have any questions  or concerns, C[employer] Customer Support Specialist are available 24 hours a day, 7 days a week at [phone] or go to [email or web address].

## 2015-05-10 NOTE — Progress Notes (Signed)
MEDICARE ANNUAL WELLNESS VISIT AND CPE  Assessment:   1. Labile hypertension - continue medications, DASH diet, exercise and monitor at home. Call if greater than 130/80.  - CBC with Differential/Platelet - BASIC METABOLIC PANEL WITH GFR - Hepatic function panel - TSH - Urinalysis, Routine w reflex microscopic - Microalbumin / creatinine urine ratio - EKG 12-Lead  2. Prediabetes Discussed general issues about diabetes pathophysiology and management., Educational material distributed., Suggested low cholesterol diet., Encouraged aerobic exercise., Discussed foot care., Reminded to get yearly retinal exam. - Hemoglobin A1c - Insulin, fasting - HM DIABETES FOOT EXAM  3. Hyperlipidemia -continue medications, check lipids, decrease fatty foods, increase activity.  - Lipid panel  4. Morbid obesity Obesity with co morbidities- long discussion about weight loss, diet, and exercise  5. Vitamin D deficiency - Vit D  25 hydroxy (rtn osteoporosis monitoring)  6. S/P left THA, AA  7. Gastroesophageal reflux disease with esophagitis Continue PPI/H2 blocker, diet discussed  8. Medication management - Magnesium  9. Screening for depression negative  10. NASH (nonalcoholic steatohepatitis) Check labs, avoid tylenol, alcohol, weight loss advised.   11. Atypical nevus of toe right Schedule removal/biopsy  12. Routine general medical examination at a health care facility Colonoscopy- patient declines a colonoscopy even though the risks and benefits were discussed at length. Colon cancer is 3rd most diagnosed cancer and 2nd leading cause of death in both men and women 29 years of age and older. Patient understands the risk of cancer and death with declining the test however they are willing to do cologuard screening instead. They understand that this is not as sensitive or specific as a colonoscopy and they are still recommended to get a colonoscopy. The cologuard will be sent out to  their house.  Will get 3D MGM, numbers given  13. Need for prophylactic vaccination with combined diphtheria-tetanus-pertussis (DTP) vaccine Patient declines, understands the risks of not getting PNX and FLU vaccines - Dt vaccine greater than 7yo IM  Over 40 minutes of exam, counseling, chart review and critical decision making was performed  Plan:   During the course of the visit the patient was educated and counseled about appropriate screening and preventive services including:    Pneumococcal vaccine   Influenza vaccine  Td vaccine  Screening electrocardiogram  Bone densitometry screening  Colorectal cancer screening  Diabetes screening  Glaucoma screening  Nutrition counseling   Advanced directives: requested  Conditions/risks identified: BMI: Discussed weight loss, diet, and increase physical activity.  Increase physical activity: AHA recommends 150 minutes of physical activity a week.  Medications reviewed Diabetes is at goal, ACE/ARB therapy: Yes. Urinary Incontinence is not an issue: discussed non pharmacology and pharmacology options.  Fall risk: low- discussed PT, home fall assessment, medications.    Subjective:  Anita Blake is a 69 y.o. female who presents for Medicare Annual Wellness Visit and complete physical.  Date of last medicare wellness visit was 03/25/2014   Her blood pressure has been controlled at home, today their BP is BP: 138/80 mmHg She does workout, works out in her garden and occ walks. She denies chest pain, shortness of breath, dizziness.  She is on cholesterol medication, livalo due to intolerance and denies myalgias. Her cholesterol is at goal. The cholesterol last visit was:   Lab Results  Component Value Date   CHOL 170 01/27/2015   HDL 60 01/27/2015   LDLCALC 95 01/27/2015   TRIG 74 01/27/2015   CHOLHDL 2.8 01/27/2015    She  has been working on diet and exercise for prediabetes, and denies paresthesia of the feet,  polydipsia, polyuria and visual disturbances. Last A1C in the office was:  Lab Results  Component Value Date   HGBA1C 6.1* 01/27/2015   Patient is on Vitamin D supplement.   Lab Results  Component Value Date   VD25OH 56 01/27/2015     BMI is Body mass index is 42.54 kg/(m^2)., she is working on diet and exercise. Husband has been sick and this has been stressing her out, he can no longer drive due to his sight. Planning on retirement at the end of this month to help him. She does have a history of NASH.  Wt Readings from Last 3 Encounters:  05/10/15 244 lb (110.678 kg)  01/27/15 241 lb (109.317 kg)  08/04/14 244 lb 6.4 oz (110.859 kg)   Occ left medial knee pain but intermittent.   Medication Review: Current Outpatient Prescriptions on File Prior to Visit  Medication Sig Dispense Refill  . aspirin 81 MG tablet Take 81 mg by mouth daily.    . Cholecalciferol (VITAMIN D PO) Take 5,000 Int'l Units by mouth daily.    Marland Kitchen LIVALO 4 MG TABS TAKE 1 TABLET BY MOUTH NIGHTLY 30 tablet 3  . magnesium 30 MG tablet Take 30 mg by mouth daily.     No current facility-administered medications on file prior to visit.    Current Problems (verified) Patient Active Problem List   Diagnosis Date Noted  . Vitamin D deficiency 01/11/2014  . Labile hypertension   . Hyperlipidemia   . GERD (gastroesophageal reflux disease)   . Prediabetes   . Expected blood loss anemia 10/07/2013  . Morbid obesity 10/07/2013  . S/P left THA, AA 10/06/2013    Screening Tests Immunization History  Administered Date(s) Administered  . Td 07/27/2005    Preventative care: Last colonoscopy: 2011 [facility]) due 2016 Last mammogram: 2014 negative DUE Last pap smear/pelvic exam: 03/2014  DEXA:2002 Neg CT head 09/2013 [place] AB 2012 US Soft tissues 2011 Results for orders placed during the hospital encounter of 05/28/11  MM Digital Screening   Narrative DG SCREEN MAMMOGRAM BILATERAL Bilateral CC and MLO view(s) were  taken.  DIGITAL SCREENING MAMMOGRAM WITH CAD: There are scattered fibroglandular densities.  No masses or malignant type calcifications are  identified.  Compared with prior studies.  Images were processed with CAD.  IMPRESSION: No specific mammographic evidence of malignancy.  Next screening mammogram is recommended in one  year.  A result letter of this screening mammogram will be mailed directly to the patient.  ASSESSMENT: Negative - BI-RADS 1  Screening mammogram in 1 year. ,    Prior vaccinations: TD or Tdap: 2006 will get due to gardening Influenza: declines Pneumococcal: declines Prevnar13: declines Shingles/Zostavax: declines  Names of Other Physician/Practitioners you currently use: 1. [facility] here for primary care 2. Dr. Kathrin Penner, eye doctor,april 2016 3. Dr. Randol Kern, dentist, last visit 2 months ago Patient Care Team: Unk Pinto, MD as PCP - General (Internal Medicine) Richmond Campbell, MD as Consulting Physician (Gastroenterology) Delila Pereyra, MD as Consulting Physician (Gynecology) Paralee Cancel, MD as Consulting Physician (Orthopedic Surgery)  SURGICAL HISTORY Past Surgical History  Procedure Laterality Date  . Abdominal hysterectomy    . Eye surgery Left 2007    macular hole in retina  . Cataract surgery Left   . Right arm elbow surgery      ligament repair  . Index finger surgery Right   .  Total hip arthroplasty Left 10/06/2013    Procedure: LEFT TOTAL HIP ARTHROPLASTY ANTERIOR APPROACH;  Surgeon: Mauri Pole, MD;  Location: [facility];  Service: Orthopedics;  Laterality: Left;   FAMILY HISTORY Family History  Problem Relation Age of Onset  . Heart disease Mother   . Heart attack Mother   . Stroke Father   . Alzheimer's disease Other    SOCIAL HISTORY History  Substance Use Topics  . Smoking status: Never Smoker   . Smokeless tobacco: Never Used  . Alcohol Use: No    MEDICARE WELLNESS  OBJECTIVES: Tobacco use: She does not smoke.  Patient is not a former smoker. Alcohol Current alcohol use: none Caffeine Current caffeine use: denies use Osteoporosis: postmenopausal estrogen deficiency and dietary calcium and/or vitamin D deficiency, History of fracture in the past year: no Diet: in general, a "healthy" diet   Physical activity: walking Depression/mood screen:   Depression screen [facility] 2/9 05/10/2015  Decreased Interest 0  Down, Depressed, Hopeless 0  PHQ - 2 Score 0   Hearing: impaired Visual acuity: impaired,  does perform annual eye exam  ADLs:  In your present state of health, do you have any difficulty performing the following activities: 05/10/2015  Hearing? N  Vision? Y  Difficulty concentrating or making decisions? N  Walking or climbing stairs? N  Dressing or bathing? N  Doing errands, shopping? N  Preparing Food and eating ? N  Using the Toilet? N  In the past six months, have you accidently leaked urine? N  Do you have problems with loss of bowel control? N  Managing your Medications? N  Managing your Finances? N  Housekeeping or managing your Housekeeping? N    Fall risk: Low Risk Cognitive Testing  Alert? Yes  Normal Appearance?Yes  Oriented to person? Yes  Place? Yes   Time? Yes  Recall of three objects?  Yes  Can perform simple calculations? Yes  Displays appropriate judgment?Yes  Can read the correct time from a watch face?Yes  EOL planning: Does patient have an advance directive?: Yes Type of Advance Directive: [place], Living will Does patient want to make changes to advanced directive?: No - Patient declined Copy of advanced directive(s) in chart?: No - copy requested     Objective:     Blood pressure 138/80, pulse 56, temperature 97.7 F (36.5 C), resp. rate 16, height 5' 3.5" (1.613 m), weight 244 lb (110.678 kg). Body mass index is 42.54 kg/(m^2).  General appearance: alert, no distress, WD/WN,  female HEENT: normocephalic, sclerae anicteric, TMs pearly, nares patent, no discharge or erythema, pharynx normal Oral cavity: MMM, no lesions Neck: supple, no lymphadenopathy, no thyromegaly, no masses Heart: RRR, normal S1, S2, no murmurs Lungs: CTA bilaterally, no wheezes, rhonchi, or rales Abdomen: +bs, soft, obese, non tender, non distended, no masses, no hepatomegaly, no splenomegaly Musculoskeletal: nontender, no swelling, no obvious deformity Extremities: no edema, no cyanosis, no clubbing Pulses: 2+ symmetric, upper and lower extremities, normal cap refill Neurological: alert, oriented x 3, CN2-12 intact, strength normal upper extremities and lower extremities, sensation normal throughout, DTRs 2+ throughout, no cerebellar signs, gait normal Psychiatric: normal affect, behavior normal, pleasant  Skin: 3x70m dark irreg nevus 2nd medial toe Breasts: symmetric fibrous changes in both upper outer quadrants.   Medicare Attestation I have personally reviewed: The patient's medical and social history Their use of alcohol, tobacco or illicit drugs Their current medications and supplements The patient's functional ability including ADLs,fall risks, home safety  risks, cognitive, and hearing and visual impairment Diet and physical activities Evidence for depression or mood disorders  The patient's weight, height, BMI, and visual acuity have been recorded in the chart.  I have made referrals, counseling, and provided education to the patient based on review of the above and I have provided the patient with a written personalized care plan for preventive services.     [name], PA-C   05/10/2015

## 2015-05-11 ENCOUNTER — Other Ambulatory Visit: Payer: Self-pay

## 2015-05-11 LAB — HEPATIC FUNCTION PANEL
ALBUMIN: 4 g/dL (ref 3.5–5.2)
ALK PHOS: 51 U/L (ref 39–117)
ALT: 20 U/L (ref 0–35)
AST: 24 U/L (ref 0–37)
BILIRUBIN INDIRECT: 0.4 mg/dL (ref 0.2–1.2)
BILIRUBIN TOTAL: 0.5 mg/dL (ref 0.2–1.2)
Bilirubin, Direct: 0.1 mg/dL (ref 0.0–0.3)
TOTAL PROTEIN: 7.1 g/dL (ref 6.0–8.3)

## 2015-05-11 LAB — MICROALBUMIN / CREATININE URINE RATIO
CREATININE, URINE: 120.5 mg/dL
Microalb Creat Ratio: 7.5 mg/g (ref 0.0–30.0)
Microalb, Ur: 0.9 mg/dL (ref <2.0)

## 2015-05-11 LAB — VITAMIN D 25 HYDROXY (VIT D DEFICIENCY, FRACTURES): Vit D, 25-Hydroxy: 41 ng/mL (ref 30–100)

## 2015-05-11 LAB — MAGNESIUM: Magnesium: 1.8 mg/dL (ref 1.5–2.5)

## 2015-05-11 LAB — INSULIN, FASTING: INSULIN FASTING, SERUM: 7.3 u[IU]/mL (ref 2.0–19.6)

## 2015-05-11 LAB — LIPID PANEL
CHOL/HDL RATIO: 3.8 ratio
Cholesterol: 226 mg/dL — ABNORMAL HIGH (ref 0–200)
HDL: 60 mg/dL (ref >=46)
LDL Cholesterol: 145 mg/dL — ABNORMAL HIGH (ref 0–99)
TRIGLYCERIDES: 103 mg/dL (ref <150)
VLDL: 21 mg/dL (ref 0–40)

## 2015-05-11 LAB — URINALYSIS, ROUTINE W REFLEX MICROSCOPIC
Bilirubin Urine: NEGATIVE
Glucose, UA: NEGATIVE mg/dL
HGB URINE DIPSTICK: NEGATIVE
Ketones, ur: NEGATIVE mg/dL
LEUKOCYTES UA: NEGATIVE
Nitrite: NEGATIVE
PH: 7 (ref 5.0–8.0)
Protein, ur: NEGATIVE mg/dL
Specific Gravity, Urine: 1.005 (ref 1.005–1.030)
UROBILINOGEN UA: 0.2 mg/dL (ref 0.0–1.0)

## 2015-05-11 LAB — BASIC METABOLIC PANEL WITH GFR
BUN: 15 mg/dL (ref 6–23)
CO2: 29 meq/L (ref 19–32)
Calcium: 8.9 mg/dL (ref 8.4–10.5)
Chloride: 101 mEq/L (ref 96–112)
Creat: 0.61 mg/dL (ref 0.50–1.10)
GFR, Est African American: >89 mL/min
GLUCOSE: 82 mg/dL (ref 70–99)
Potassium: 4.1 mEq/L (ref 3.5–5.3)
Sodium: 139 mEq/L (ref 135–145)

## 2015-05-11 LAB — TSH: TSH: 1.541 u[IU]/mL (ref 0.350–4.500)

## 2015-05-17 ENCOUNTER — Other Ambulatory Visit: Payer: Self-pay | Admitting: Gynecology

## 2015-05-17 DIAGNOSIS — N6320 Unspecified lump in the left breast, unspecified quadrant: Secondary | ICD-10-CM

## 2015-05-19 ENCOUNTER — Ambulatory Visit
Admission: RE | Admit: 2015-05-19 | Discharge: 2015-05-19 | Disposition: A | Discharge status: Home / Self Care | Payer: Medicare PPO | Source: Ambulatory Visit | Attending: Gynecology | Admitting: Gynecology

## 2015-05-19 DIAGNOSIS — N6320 Unspecified lump in the left breast, unspecified quadrant: Secondary | ICD-10-CM

## 2015-05-22 DIAGNOSIS — Z1211 Encounter for screening for malignant neoplasm of colon: Secondary | ICD-10-CM | POA: Diagnosis not present

## 2015-05-22 DIAGNOSIS — Z1212 Encounter for screening for malignant neoplasm of rectum: Secondary | ICD-10-CM | POA: Diagnosis not present

## 2015-05-24 LAB — COLOGUARD

## 2015-05-25 ENCOUNTER — Other Ambulatory Visit: Payer: Self-pay | Admitting: Emergency Medicine

## 2015-06-02 ENCOUNTER — Encounter: Payer: Self-pay | Admitting: Physician Assistant

## 2015-06-02 ENCOUNTER — Ambulatory Visit (INDEPENDENT_AMBULATORY_CARE_PROVIDER_SITE_OTHER): Payer: Medicare PPO | Admitting: Physician Assistant

## 2015-06-02 VITALS — BP 140/86 | HR 56 | Temp 97.7°F | Resp 16 | Wt 241.0 lb

## 2015-06-02 DIAGNOSIS — D2271 Melanocytic nevi of right lower limb, including hip: Secondary | ICD-10-CM

## 2015-06-02 DIAGNOSIS — L814 Other melanin hyperpigmentation: Secondary | ICD-10-CM | POA: Diagnosis not present

## 2015-06-02 NOTE — Patient Instructions (Signed)
Mole Excision Your caregiver has removed (excised) a mole. Most moles are benign (non cancerous). Some moles may change over time and require biopsy (tissue sample) or removal. The mole usually is removed by shaving or cutting it from the skin. You will have stitches in your skin if the mole is large. A small mole, or one that is shaved off, may require only a small bandage. Your caregiver will send a piece of the mole to the laboratory (pathology) to examine it under a microscope for signs of cancer. Make sure you get your biopsy results when you return for your follow-up visit. Call if there is no return visit. HOME CARE INSTRUCTIONS   If the biopsied area was the arm or leg, keep it raised (above the level of your heart) to decrease pain and swelling, if you are having any.  Keep the wound and dressing clean and dry. Clean as necessary.  If the dressing gets wet, remove it slowly and carefully. If it sticks, use warm, soapy water to gently loosen it. Pat the area dry with a clean towel before putting on another dressing.  Return in 7 days or as directed to have your sutures (stitches) removed.  Call in 3 to 4 days, or as directed, for the results of your biopsy. SEEK IMMEDIATE MEDICAL CARE IF:   You have a fever.  You have excess blood soaking through the dressing.  You have increasing pain and swelling in the wound.  You have numbness or swelling below the wound.  You have redness, swelling, pus, a bad smell, or red streaks coming away from the wound, or any other signs of infection. MAKE SURE YOU:   Understand these instructions.  Will watch your condition.  Will get help right away if you are not doing well or get worse. Document Released: 11/30/2000 Document Revised: 02/25/2012 Document Reviewed: 11/05/2007 [facility] Patient Information 2015 [place], [place]. This information is not intended to replace advice given to you by your health care provider. Make sure you discuss any  questions you have with your health care provider.

## 2015-06-02 NOTE — Progress Notes (Signed)
Chief Complaint: Patient presents for evaluation of a skin lesion. At recent CPE had atypical nevus right toe that is new.   Exam: Border: abnormal Color: black Size: 4 mm Location: right medial 2nd toe  Anesthesia: Lidocaine 1% without epinephrine   Procedure Details   The risks, benefits, indications, potential complications, and alternatives were explained to the patient and informed consent obtained.  ELECTRO: The lesion and surrounding area was given sterile prep using alcohol and draped in the usual sterile fashion. A 11 blade was used to excise an elliptical area of skin approximately 1cm by 1cm. The wound was closed with electrocaudry. Antibiotic ointment and a sterile dressing applied. The specimen was sent for pathologic examination. The patient tolerated the procedure well with minimal blood loss.   Condition: Stable  Complications:  None  Diagnosis: Atypical nevus of toe of right foot  Procedure code: 01586  Plan: 1. Instructed to keep the wound dry and covered for 24-48 hours and clean thereafter. 2. Warning signs of infection were reviewed.    3. Recommended that the patient use OTC acetaminophen as needed for pain.   4. Return as needed

## 2015-06-06 ENCOUNTER — Encounter: Payer: Self-pay | Admitting: *Deleted

## 2015-06-13 ENCOUNTER — Other Ambulatory Visit: Payer: Self-pay

## 2015-06-29 DIAGNOSIS — M25612 Stiffness of left shoulder, not elsewhere classified: Secondary | ICD-10-CM | POA: Diagnosis not present

## 2015-06-29 DIAGNOSIS — M25512 Pain in left shoulder: Secondary | ICD-10-CM | POA: Diagnosis not present

## 2015-06-29 DIAGNOSIS — M792 Neuralgia and neuritis, unspecified: Secondary | ICD-10-CM | POA: Diagnosis not present

## 2015-07-07 DIAGNOSIS — M25612 Stiffness of left shoulder, not elsewhere classified: Secondary | ICD-10-CM | POA: Diagnosis not present

## 2015-07-07 DIAGNOSIS — M25512 Pain in left shoulder: Secondary | ICD-10-CM | POA: Diagnosis not present

## 2015-08-16 DIAGNOSIS — M792 Neuralgia and neuritis, unspecified: Secondary | ICD-10-CM | POA: Diagnosis not present

## 2015-08-19 DIAGNOSIS — M792 Neuralgia and neuritis, unspecified: Secondary | ICD-10-CM | POA: Diagnosis not present

## 2015-08-24 ENCOUNTER — Ambulatory Visit: Payer: Self-pay | Admitting: Internal Medicine

## 2015-08-26 DIAGNOSIS — M792 Neuralgia and neuritis, unspecified: Secondary | ICD-10-CM | POA: Diagnosis not present

## 2015-09-05 DIAGNOSIS — M792 Neuralgia and neuritis, unspecified: Secondary | ICD-10-CM | POA: Diagnosis not present

## 2015-09-06 DIAGNOSIS — N644 Mastodynia: Secondary | ICD-10-CM | POA: Diagnosis not present

## 2015-09-07 ENCOUNTER — Ambulatory Visit (INDEPENDENT_AMBULATORY_CARE_PROVIDER_SITE_OTHER): Payer: Medicare PPO | Admitting: Internal Medicine

## 2015-09-07 ENCOUNTER — Encounter: Payer: Self-pay | Admitting: Internal Medicine

## 2015-09-07 VITALS — BP 126/82 | HR 64 | Temp 97.7°F | Resp 16 | Ht 63.5 in | Wt 245.4 lb

## 2015-09-07 DIAGNOSIS — I10 Essential (primary) hypertension: Secondary | ICD-10-CM | POA: Diagnosis not present

## 2015-09-07 DIAGNOSIS — R7309 Other abnormal glucose: Secondary | ICD-10-CM | POA: Diagnosis not present

## 2015-09-07 DIAGNOSIS — K219 Gastro-esophageal reflux disease without esophagitis: Secondary | ICD-10-CM

## 2015-09-07 DIAGNOSIS — E559 Vitamin D deficiency, unspecified: Secondary | ICD-10-CM | POA: Diagnosis not present

## 2015-09-07 DIAGNOSIS — R0989 Other specified symptoms and signs involving the circulatory and respiratory systems: Secondary | ICD-10-CM

## 2015-09-07 DIAGNOSIS — Z6841 Body Mass Index (BMI) 40.0 and over, adult: Secondary | ICD-10-CM

## 2015-09-07 DIAGNOSIS — R7303 Prediabetes: Secondary | ICD-10-CM

## 2015-09-07 DIAGNOSIS — E785 Hyperlipidemia, unspecified: Secondary | ICD-10-CM

## 2015-09-07 DIAGNOSIS — Z79899 Other long term (current) drug therapy: Secondary | ICD-10-CM | POA: Diagnosis not present

## 2015-09-07 LAB — CBC WITH DIFFERENTIAL/PLATELET
BASOS ABS: 0 10*3/uL (ref 0.0–0.1)
BASOS PCT: 0 % (ref 0–1)
EOS ABS: 0.1 10*3/uL (ref 0.0–0.7)
Eosinophils Relative: 2 % (ref 0–5)
HCT: 35.7 % — ABNORMAL LOW (ref 36.0–46.0)
Hemoglobin: 11.7 g/dL — ABNORMAL LOW (ref 12.0–15.0)
Lymphocytes Relative: 38 % (ref 12–46)
Lymphs Abs: 2.7 10*3/uL (ref 0.7–4.0)
MCH: 25.9 pg — ABNORMAL LOW (ref 26.0–34.0)
MCHC: 32.8 g/dL (ref 30.0–36.0)
MCV: 79 fL (ref 78.0–100.0)
MPV: 9.8 fL (ref 8.6–12.4)
Monocytes Absolute: 0.4 10*3/uL (ref 0.1–1.0)
Monocytes Relative: 6 % (ref 3–12)
NEUTROS PCT: 54 % (ref 43–77)
Neutro Abs: 3.9 10*3/uL (ref 1.7–7.7)
PLATELETS: 284 10*3/uL (ref 150–400)
RBC: 4.52 MIL/uL (ref 3.87–5.11)
RDW: 16.1 % — ABNORMAL HIGH (ref 11.5–15.5)
WBC: 7.2 10*3/uL (ref 4.0–10.5)

## 2015-09-07 LAB — LIPID PANEL
CHOL/HDL RATIO: 3.3 ratio (ref <=5.0)
CHOLESTEROL: 186 mg/dL (ref 125–200)
HDL: 56 mg/dL (ref >=46)
LDL CALC: 109 mg/dL (ref <130)
Triglycerides: 106 mg/dL (ref <150)
VLDL: 21 mg/dL (ref <30)

## 2015-09-07 LAB — BASIC METABOLIC PANEL WITH GFR
BUN: 16 mg/dL (ref 7–25)
CHLORIDE: 102 mmol/L (ref 98–110)
CO2: 29 mmol/L (ref 20–31)
Calcium: 8.9 mg/dL (ref 8.6–10.4)
Creat: 0.62 mg/dL (ref 0.50–0.99)
GFR, Est African American: >89 mL/min (ref >=60)
Glucose, Bld: 75 mg/dL (ref 65–99)
POTASSIUM: 3.9 mmol/L (ref 3.5–5.3)
SODIUM: 140 mmol/L (ref 135–146)

## 2015-09-07 LAB — HEPATIC FUNCTION PANEL
ALK PHOS: 55 U/L (ref 33–130)
ALT: 25 U/L (ref 6–29)
AST: 22 U/L (ref 10–35)
Albumin: 3.8 g/dL (ref 3.6–5.1)
BILIRUBIN DIRECT: 0.1 mg/dL (ref <=0.2)
BILIRUBIN INDIRECT: 0.6 mg/dL (ref 0.2–1.2)
TOTAL PROTEIN: 6.4 g/dL (ref 6.1–8.1)
Total Bilirubin: 0.7 mg/dL (ref 0.2–1.2)

## 2015-09-07 LAB — MAGNESIUM: Magnesium: 1.7 mg/dL (ref 1.5–2.5)

## 2015-09-07 LAB — TSH: TSH: 1.234 u[IU]/mL (ref 0.350–4.500)

## 2015-09-07 NOTE — Progress Notes (Signed)
Patient ID: Anita Blake, female   DOB: Oct 03, 1946, 69 y.o.   MRN: [id number]   This very nice 69 y.o.female presents for 3 month follow up with Hypertension, Hyperlipidemia, Pre-Diabetes and Vitamin D Deficiency.    Patient is treated for HTN & BP has been controlled at home. Today's BP: 126/82 mmHg. Patient has had no complaints of any cardiac type chest pain, palpitations, dyspnea/orthopnea/PND, dizziness, claudication, or dependent edema.   Hyperlipidemia is not  controlled with diet & meds ( On Livelo as was intolerant to Crestor). Patient denies myalgias or other med SE's. Last Lipids were not at goal Cholesterol 226*; HDL 60; LDL 145*; Triglycerides 103 on 05/10/2015   Also, the patient has history of Morbid Obesity (BMI 42.79) and consequent T2_NIDDM and has had no symptoms of reactive hypoglycemia, diabetic polys, paresthesias or visual blurring.  Last A1c was 6.3% on 05/10/2015.     Further, the patient also has history of Vitamin D Deficiency and supplements vitamin D without any suspected side-effects. Last vitamin D was 41 on 05/10/2015.  Medication Sig  . aspirin 81 MG tablet Take 81 mg by mouth daily.  . Cholecalciferol (VITAMIN D PO) Take 5,000 Int'l Units by mouth daily.  Marland Kitchen LIVALO 4 MG TABS TAKE 1 TABLET BY MOUTH NIGHTLY  . magnesium 30 MG tablet Take 30 mg by mouth daily.   Allergies  Allergen Reactions  . Anesthetics, Halogenated     Difficulty with waking up from anesthesia from [facility], had aphasia, negative CT  . Crestor [Rosuvastatin]   . Lopid [Gemfibrozil]   . Latex Rash   PMHx:   Past Medical History  Diagnosis Date  . Anemia yrs ago  . Complication of anesthesia     slow to wake up  . Labile hypertension   . Hyperlipidemia   . Arthritis   . GERD (gastroesophageal reflux disease)   . Prediabetes   . Nonalcoholic steatohepatitis (NASH)   . Vitamin D deficiency    Immunization History  Administered Date(s) Administered  . DT 05/10/2015  . Td 07/27/2005    Past Surgical History  Procedure Laterality Date  . Abdominal hysterectomy    . Eye surgery Left 2007    macular hole in retina  . Cataract surgery Left   . Right arm elbow surgery      ligament repair  . Index finger surgery Right   . Total hip arthroplasty Left 10/06/2013    Procedure: LEFT TOTAL HIP ARTHROPLASTY ANTERIOR APPROACH;  Surgeon: Mauri Pole, MD;  Location: [facility];  Service: Orthopedics;  Laterality: Left;   FHx:    Reviewed / unchanged  SHx:    Reviewed / unchanged  Systems Review:  Constitutional: Denies fever, chills, wt changes, headaches, insomnia, fatigue, night sweats, change in appetite. Eyes: Denies redness, blurred vision, diplopia, discharge, itchy, watery eyes.  ENT: Denies discharge, congestion, post nasal drip, epistaxis, sore throat, earache, hearing loss, dental pain, tinnitus, vertigo, sinus pain, snoring.  CV: Denies chest pain, palpitations, irregular heartbeat, syncope, dyspnea, diaphoresis, orthopnea, PND, claudication or edema. Respiratory: denies cough, dyspnea, DOE, pleurisy, hoarseness, laryngitis, wheezing.  Gastrointestinal: Denies dysphagia, odynophagia, heartburn, reflux, water brash, abdominal pain or cramps, nausea, vomiting, bloating, diarrhea, constipation, hematemesis, melena, hematochezia  or hemorrhoids. Genitourinary: Denies dysuria, frequency, urgency, nocturia, hesitancy, discharge, hematuria or flank pain. Musculoskeletal: Denies arthralgias, myalgias, stiffness, jt. swelling, pain, limping or strain/sprain.  Skin: Denies pruritus, rash, hives, warts, acne, eczema or change in skin lesion(s). Neuro: No weakness, tremor, incoordination, spasms,  paresthesia or pain. Psychiatric: Denies confusion, memory loss or sensory loss. Endo: Denies change in weight, skin or hair change.  Heme/Lymph: No excessive bleeding, bruising or enlarged lymph nodes.  Physical Exam  BP 126/82 m  Pulse 64  Temp97.7 F   Resp 16  Ht 5' 3.5"   Wt  245 lb 6.4 oz     BMI 42.78   Appears well nourished and in no distress. Eyes: PERRLA, EOMs, conjunctiva no swelling or erythema. Sinuses: No frontal/maxillary tenderness ENT/Mouth: EAC's clear, TM's nl w/o erythema, bulging. Nares clear w/o erythema, swelling, exudates. Oropharynx clear without erythema or exudates. Oral hygiene is good. Tongue normal, non obstructing. Hearing intact.  Neck: Supple. Thyroid nl. Car 2+/2+ without bruits, nodes or JVD. Chest: Respirations nl with BS clear & equal w/o rales, rhonchi, wheezing or stridor.  Cor: Heart sounds normal w/ regular rate and rhythm without sig. murmurs, gallops, clicks, or rubs. Peripheral pulses normal and equal  without edema.  Abdomen: Soft & bowel sounds normal. Non-tender w/o guarding, rebound, hernias, masses, or organomegaly.  Lymphatics: Unremarkable.  Musculoskeletal: Full ROM all peripheral extremities, joint stability, 5/5 strength, and normal gait.  Skin: Warm, dry without exposed rashes, lesions or ecchymosis apparent.  Neuro: Cranial nerves intact, reflexes equal bilaterally. Sensory-motor testing grossly intact. Tendon reflexes grossly intact.  Pysch: Alert & oriented x 3.  Insight and judgement nl & appropriate. No ideations.  Assessment and Plan:  1. Labile hypertension  - TSH  2. Hyperlipidemia  - Lipid panel  3. Prediabetes  - Hemoglobin A1c - Insulin, random  4. Vitamin D deficiency  - Vit D  25 hydroxy   5. Gastroesophageal reflux disease   6. Medication management  - CBC with Differential/Platelet - BASIC METABOLIC PANEL WITH GFR - Hepatic function panel - Magnesium  7. BMI 40.0-44.9, Morbid Obesity   - Long discussion (~20 min)  XL:[id number] & weight loss and recc Dr Fara Olden Fuhrman's books - The End of Dieting & The End of Diabetes.    Recommended regular exercise, BP monitoring, weight control, and discussed med and SE's. Recommended labs to assess and monitor clinical status. Further  disposition pending results of labs. Over 30 minutes of exam, counseling, chart review was performed

## 2015-09-07 NOTE — Patient Instructions (Signed)
Recommend Adult Low dose Aspirin or   coated  Aspirin 81 mg daily   To reduce risk of Colon Cancer 20 %,   Skin Cancer 26 % ,   Melanoma 46%   and   Pancreatic cancer 60%  ++++++++++++++++++  Vitamin D goal   is between 70-100.   Please make sure that you are taking your Vitamin D as directed.   It is very important as a natural anti-inflammatory   helping hair, skin, and nails, as well as reducing stroke and heart attack risk.   It helps your bones and helps with mood.  It also decreases numerous cancer risks so please take it as directed.   Low Vit D is associated with a 200-300% higher risk for CANCER   and 200-300% higher risk for HEART   ATTACK  &  STROKE.   .....................................Marland Kitchen  It is also associated with higher death rate at younger ages,   autoimmune diseases like Rheumatoid arthritis, Lupus, Multiple Sclerosis.     Also many other serious conditions, like depression, Alzheimer's  Dementia, infertility, muscle aches, fatigue, fibromyalgia - just to name a few.  +++++++++++++++++++  Recommend the book "The END of DIETING" by Dr Excell Seltzer   & the book "The END of DIABETES " by Dr Excell Seltzer  At [facility] - get book & Audio CD's     Being diabetic has a  300% increased risk for heart attack, stroke, cancer, and alzheimer- type vascular dementia. It is very important that you work harder with diet by avoiding all foods that are white. Avoid white rice (brown & wild rice is OK), white potatoes (sweetpotatoes in moderation is OK), White bread or wheat bread or anything made out of white flour like bagels, donuts, rolls, buns, biscuits, cakes, pastries, cookies, pizza crust, and pasta (made from white flour & egg whites) - vegetarian pasta or spinach or wheat pasta is OK. Multigrain breads like Arnold's or Pepperidge Farm, or multigrain sandwich thins or flatbreads.  Diet, exercise and weight loss can reverse and cure diabetes in the early  stages.  Diet, exercise and weight loss is very important in the control and prevention of complications of diabetes which affects every system in your body, ie. Brain - dementia/stroke, eyes - glaucoma/blindness, heart - heart attack/heart failure, kidneys - dialysis, stomach - gastric paralysis, intestines - malabsorption, nerves - severe painful neuritis, circulation - gangrene & loss of a leg(s), and finally cancer and Alzheimers.    I recommend avoid fried & greasy foods,  sweets/candy, white rice (brown or wild rice or Quinoa is OK), white potatoes (sweet potatoes are OK) - anything made from white flour - bagels, doughnuts, rolls, buns, biscuits,white and wheat breads, pizza crust and traditional pasta made of white flour & egg white(vegetarian pasta or spinach or wheat pasta is OK).  Multi-grain bread is OK - like multi-grain flat bread or sandwich thins. Avoid alcohol in excess. Exercise is also important.    Eat all the vegetables you want - avoid meat, especially red meat and dairy - especially cheese.  Cheese is the most concentrated form of trans-fats which is the worst thing to clog up our arteries. Veggie cheese is OK which can be found in the fresh produce section at [facility] or [employer] or [employer]  ++++++++++++++++++++++++++

## 2015-09-08 DIAGNOSIS — M792 Neuralgia and neuritis, unspecified: Secondary | ICD-10-CM | POA: Diagnosis not present

## 2015-09-08 LAB — INSULIN, RANDOM: Insulin: 11.9 u[IU]/mL (ref 2.0–19.6)

## 2015-09-08 LAB — HEMOGLOBIN A1C
HEMOGLOBIN A1C: 6.2 % — AB (ref <5.7)
Mean Plasma Glucose: 131 mg/dL — ABNORMAL HIGH (ref <117)

## 2015-09-08 LAB — VITAMIN D 25 HYDROXY (VIT D DEFICIENCY, FRACTURES): Vit D, 25-Hydroxy: 43 ng/mL (ref 30–100)

## 2015-09-13 DIAGNOSIS — M792 Neuralgia and neuritis, unspecified: Secondary | ICD-10-CM | POA: Diagnosis not present

## 2015-09-15 DIAGNOSIS — M25512 Pain in left shoulder: Secondary | ICD-10-CM | POA: Diagnosis not present

## 2015-09-15 DIAGNOSIS — M25612 Stiffness of left shoulder, not elsewhere classified: Secondary | ICD-10-CM | POA: Diagnosis not present

## 2015-09-15 DIAGNOSIS — M7542 Impingement syndrome of left shoulder: Secondary | ICD-10-CM | POA: Diagnosis not present

## 2015-09-15 DIAGNOSIS — M75102 Unspecified rotator cuff tear or rupture of left shoulder, not specified as traumatic: Secondary | ICD-10-CM | POA: Diagnosis not present

## 2015-09-23 DIAGNOSIS — M25512 Pain in left shoulder: Secondary | ICD-10-CM | POA: Diagnosis not present

## 2015-09-30 DIAGNOSIS — M75102 Unspecified rotator cuff tear or rupture of left shoulder, not specified as traumatic: Secondary | ICD-10-CM | POA: Diagnosis not present

## 2015-09-30 DIAGNOSIS — M7542 Impingement syndrome of left shoulder: Secondary | ICD-10-CM | POA: Diagnosis not present

## 2015-09-30 DIAGNOSIS — M25512 Pain in left shoulder: Secondary | ICD-10-CM | POA: Diagnosis not present

## 2015-10-04 ENCOUNTER — Other Ambulatory Visit: Payer: Self-pay | Admitting: Physician Assistant

## 2015-11-14 DIAGNOSIS — M7542 Impingement syndrome of left shoulder: Secondary | ICD-10-CM | POA: Diagnosis not present

## 2015-11-14 DIAGNOSIS — M25512 Pain in left shoulder: Secondary | ICD-10-CM | POA: Diagnosis not present

## 2015-11-14 DIAGNOSIS — M7582 Other shoulder lesions, left shoulder: Secondary | ICD-10-CM | POA: Diagnosis not present

## 2015-11-14 DIAGNOSIS — M75102 Unspecified rotator cuff tear or rupture of left shoulder, not specified as traumatic: Secondary | ICD-10-CM | POA: Diagnosis not present

## 2015-11-24 DIAGNOSIS — Z961 Presence of intraocular lens: Secondary | ICD-10-CM | POA: Diagnosis not present

## 2015-11-24 DIAGNOSIS — H35342 Macular cyst, hole, or pseudohole, left eye: Secondary | ICD-10-CM | POA: Diagnosis not present

## 2015-11-24 DIAGNOSIS — H2511 Age-related nuclear cataract, right eye: Secondary | ICD-10-CM | POA: Diagnosis not present

## 2016-01-12 ENCOUNTER — Encounter: Payer: Self-pay | Admitting: Physician Assistant

## 2016-01-12 ENCOUNTER — Ambulatory Visit (INDEPENDENT_AMBULATORY_CARE_PROVIDER_SITE_OTHER): Payer: Medicare Other | Admitting: Physician Assistant

## 2016-01-12 VITALS — BP 118/76 | HR 76 | Temp 97.9°F | Resp 16 | Ht 63.5 in | Wt 252.6 lb

## 2016-01-12 DIAGNOSIS — R7303 Prediabetes: Secondary | ICD-10-CM

## 2016-01-12 DIAGNOSIS — I10 Essential (primary) hypertension: Secondary | ICD-10-CM

## 2016-01-12 DIAGNOSIS — R6889 Other general symptoms and signs: Secondary | ICD-10-CM | POA: Diagnosis not present

## 2016-01-12 DIAGNOSIS — K7581 Nonalcoholic steatohepatitis (NASH): Secondary | ICD-10-CM | POA: Diagnosis not present

## 2016-01-12 DIAGNOSIS — Z79899 Other long term (current) drug therapy: Secondary | ICD-10-CM | POA: Diagnosis not present

## 2016-01-12 DIAGNOSIS — IMO0002 Reserved for concepts with insufficient information to code with codable children: Secondary | ICD-10-CM

## 2016-01-12 DIAGNOSIS — K219 Gastro-esophageal reflux disease without esophagitis: Secondary | ICD-10-CM | POA: Diagnosis not present

## 2016-01-12 DIAGNOSIS — E785 Hyperlipidemia, unspecified: Secondary | ICD-10-CM | POA: Diagnosis not present

## 2016-01-12 DIAGNOSIS — H2510 Age-related nuclear cataract, unspecified eye: Secondary | ICD-10-CM

## 2016-01-12 DIAGNOSIS — Z0001 Encounter for general adult medical examination with abnormal findings: Secondary | ICD-10-CM | POA: Diagnosis not present

## 2016-01-12 DIAGNOSIS — Z9660 Presence of unspecified orthopedic joint implant: Secondary | ICD-10-CM | POA: Diagnosis not present

## 2016-01-12 DIAGNOSIS — E559 Vitamin D deficiency, unspecified: Secondary | ICD-10-CM | POA: Diagnosis not present

## 2016-01-12 DIAGNOSIS — H35349 Macular cyst, hole, or pseudohole, unspecified eye: Secondary | ICD-10-CM | POA: Diagnosis not present

## 2016-01-12 DIAGNOSIS — Z96649 Presence of unspecified artificial hip joint: Secondary | ICD-10-CM

## 2016-01-12 DIAGNOSIS — R0989 Other specified symptoms and signs involving the circulatory and respiratory systems: Secondary | ICD-10-CM

## 2016-01-12 DIAGNOSIS — Z0000 Encounter for general adult medical examination without abnormal findings: Secondary | ICD-10-CM

## 2016-01-12 LAB — BASIC METABOLIC PANEL WITH GFR
BUN: 14 mg/dL (ref 7–25)
CHLORIDE: 100 mmol/L (ref 98–110)
CO2: 30 mmol/L (ref 20–31)
CREATININE: 0.69 mg/dL (ref 0.50–0.99)
Calcium: 9.4 mg/dL (ref 8.6–10.4)
GFR, Est Non African American: >89 mL/min (ref >=60)
Glucose, Bld: 84 mg/dL (ref 65–99)
POTASSIUM: 4.4 mmol/L (ref 3.5–5.3)
SODIUM: 138 mmol/L (ref 135–146)

## 2016-01-12 LAB — CBC WITH DIFFERENTIAL/PLATELET
Basophils Absolute: 0 10*3/uL (ref 0.0–0.1)
Basophils Relative: 0 % (ref 0–1)
EOS PCT: 2 % (ref 0–5)
Eosinophils Absolute: 0.1 10*3/uL (ref 0.0–0.7)
HCT: 39.5 % (ref 36.0–46.0)
HEMOGLOBIN: 12.3 g/dL (ref 12.0–15.0)
LYMPHS ABS: 2.6 10*3/uL (ref 0.7–4.0)
LYMPHS PCT: 35 % (ref 12–46)
MCH: 25.2 pg — ABNORMAL LOW (ref 26.0–34.0)
MCHC: 31.1 g/dL (ref 30.0–36.0)
MCV: 80.9 fL (ref 78.0–100.0)
MONO ABS: 0.5 10*3/uL (ref 0.1–1.0)
MONOS PCT: 7 % (ref 3–12)
MPV: 10.2 fL (ref 8.6–12.4)
NEUTROS ABS: 4.1 10*3/uL (ref 1.7–7.7)
Neutrophils Relative %: 56 % (ref 43–77)
PLATELETS: 297 10*3/uL (ref 150–400)
RBC: 4.88 MIL/uL (ref 3.87–5.11)
RDW: 15 % (ref 11.5–15.5)
WBC: 7.4 10*3/uL (ref 4.0–10.5)

## 2016-01-12 LAB — HEPATIC FUNCTION PANEL
ALT: 38 U/L — ABNORMAL HIGH (ref 6–29)
AST: 33 U/L (ref 10–35)
Albumin: 4.2 g/dL (ref 3.6–5.1)
Alkaline Phosphatase: 58 U/L (ref 33–130)
BILIRUBIN DIRECT: 0.1 mg/dL (ref <=0.2)
BILIRUBIN TOTAL: 0.7 mg/dL (ref 0.2–1.2)
Indirect Bilirubin: 0.6 mg/dL (ref 0.2–1.2)
Total Protein: 7.2 g/dL (ref 6.1–8.1)

## 2016-01-12 LAB — LIPID PANEL
CHOL/HDL RATIO: 3.4 ratio (ref <=5.0)
CHOLESTEROL: 191 mg/dL (ref 125–200)
HDL: 56 mg/dL (ref >=46)
LDL Cholesterol: 114 mg/dL (ref <130)
Triglycerides: 104 mg/dL (ref <150)
VLDL: 21 mg/dL (ref <30)

## 2016-01-12 LAB — HEMOGLOBIN A1C
Hgb A1c MFr Bld: 6.3 % — ABNORMAL HIGH (ref <5.7)
MEAN PLASMA GLUCOSE: 134 mg/dL — AB (ref <117)

## 2016-01-12 LAB — TSH: TSH: 2.216 u[IU]/mL (ref 0.350–4.500)

## 2016-01-12 LAB — MAGNESIUM: MAGNESIUM: 2 mg/dL (ref 1.5–2.5)

## 2016-01-12 NOTE — Patient Instructions (Signed)
[place] The art of racing in the rain   We want weight loss that will last so you should lose 1-2 pounds a week.  THAT IS IT! Please pick THREE things a month to change. Once it is a habit check off the item. Then pick another three items off the list to become habits.  If you are already doing a habit on the list GREAT!  Cross that item off! o Don't drink your calories. Ie, alcohol, soda, fruit juice, and sweet tea.  o Drink more water. Drink a glass when you feel hungry or before each meal.  o Eat breakfast - Complex carb and protein (likeDannon light and fit yogurt, oatmeal, fruit, eggs, [place] bacon). o Measure your cereal.  Eat no more than one cup a day. (ie [place]) o Eat an apple a day. o Add a vegetable a day. o Try a new vegetable a month. o Use Pam! Stop using oil or butter to cook. o Don't finish your plate or use smaller plates. o Share your dessert. o Eat sugar free Jello for dessert or frozen grapes. o Don't eat 2-3 hours before bed. o Switch to whole wheat bread, pasta, and brown rice. o Make healthier choices when you eat out. No fries! o Pick baked chicken, NOT fried. o Don't forget to SLOW DOWN when you eat. It is not going anywhere.  o Take the stairs. o Park far away in the parking lot o [employer] (or weights) for 10 minutes while watching TV. o Walk at work for 10 minutes during break. o Walk outside 1 time a week with your friend, kids, dog, or significant other. o Start a walking group at [place] the mall as much as you can tolerate.  o Keep a food diary. o Weigh yourself daily. o Walk for 15 minutes 3 days per week. o Cook at home more often and eat out less.  If life happens and you go back to old habits, it is okay.  Just start over. You can do it!   If you experience chest pain, get short of breath, or tired during the exercise, please stop immediately and inform your doctor.   Before you even begin to attack a weight-loss plan, it pays to  remember this: You are not fat. You have fat. Losing weight isn't about blame or shame; it's simply another achievement to accomplish. Dieting is like any other skill-you have to buckle down and work at it. As long as you act in a smart, reasonable way, you'll ultimately get where you want to be. Here are some weight loss pearls for you.  1. It's Not a Diet. It's a Lifestyle Thinking of a diet as something you're on and suffering through only for the short term doesn't work. To shed weight and keep it off, you need to make permanent changes to the way you eat. It's OK to indulge occasionally, of course, but if you cut calories temporarily and then revert to your old way of eating, you'll gain back the weight quicker than you can say yo-yo. Use it to lose it. Research shows that one of the best predictors of long-term weight loss is how many pounds you drop in the first month. For that reason, nutritionists often suggest being stricter for the first two weeks of your new eating strategy to build momentum. Cut out added sugar and alcohol and avoid unrefined carbs. After that, figure out how you can reincorporate them in  a way that's healthy and maintainable.  2. There's a Right Way to Exercise Working out burns calories and fat and boosts your metabolism by building muscle. But those trying to lose weight are notorious for overestimating the number of calories they burn and underestimating the amount they take in. Unfortunately, your system is biologically programmed to hold on to extra pounds and that means when you start exercising, your body senses the deficit and ramps up its hunger signals. If you're not diligent, you'll eat everything you burn and then some. Use it to lose it. Cardio gets all the exercise glory, but strength and interval training are the real heroes. They help you build lean muscle, which in turn increases your metabolism and calorie-burning ability 3. Don't Overreact to Mild Hunger Some  people have a hard time losing weight because of hunger anxiety. To them, being hungry is bad-something to be avoided at all costs-so they carry snacks with them and eat when they don't need to. Others eat because they're stressed out or bored. While you never want to get to the point of being ravenous (that's when bingeing is likely to happen), a hunger pang, a craving, or the fact that it's 3:00 p.m. should not send you racing for the vending machine or obsessing about the energy bar in your purse. Ideally, you should put off eating until your stomach is growling and it's difficult to concentrate.  Use it to lose it. When you feel the urge to eat, use the HALT method. Ask yourself, Am I really hungry? Or am I angry or anxious, lonely or bored, or tired? If you're still not certain, try the apple test. If you're truly hungry, an apple should seem delicious; if it doesn't, something else is going on. Or you can try drinking water and making yourself busy, if you are still hungry try a healthy snack.  4. Not All Calories Are Created Equal The mechanics of weight loss are pretty simple: Take in fewer calories than you use for energy. But the kind of food you eat makes all the difference. Processed food that's high in saturated fat and refined starch or sugar can cause inflammation that disrupts the hormone signals that tell your brain you're full. The result: You eat a lot more.  Use it to lose it. Clean up your diet. Swap in whole, unprocessed foods, including vegetables, lean protein, and healthy fats that will fill you up and give you the biggest nutritional bang for your calorie buck. In a few weeks, as your brain starts receiving regular hunger and fullness signals once again, you'll notice that you feel less hungry overall and naturally start cutting back on the amount you eat.  5. Protein, Produce, and Plant-Based Fats Are Your Weight-Loss Trinity Here's why eating the three Ps regularly will help you  drop pounds. Protein fills you up. You need it to build lean muscle, which keeps your metabolism humming so that you can torch more fat. People in a weight-loss program who ate double the recommended daily allowance for protein (about 110 grams for a 150-pound woman) lost 70 percent of their weight from fat, while people who ate the RDA lost only about 40 percent, one study found. Produce is packed with filling fiber. "It's very difficult to consume too many calories if you're eating a lot of vegetables. Example: Three cups of broccoli is a lot of food, yet only 93 calories. (Fruit is another story. It can be easy to overeat and can  contain a lot of calories from sugar, so be sure to monitor your intake.) Plant-based fats like olive oil and those in avocados and nuts are healthy and extra satiating.  Use it to lose it. Aim to incorporate each of the three Ps into every meal and snack. People who eat protein throughout the day are able to keep weight off, according to a study in the [place] of Clinical Nutrition. In addition to meat, poultry and seafood, good sources are beans, lentils, eggs, tofu, and yogurt. As for fat, keep portion sizes in check by measuring out salad dressing, oil, and nut butters (shoot for one to two tablespoons). Finally, eat veggies or a little fruit at every meal. People who did that consumed 308 fewer calories but didn't feel any hungrier than when they didn't eat more produce.  7. How You Eat Is As Important As What You Eat In order for your brain to register that you're full, you need to focus on what you're eating. Sit down whenever you eat, preferably at a table. Turn off the TV or computer, put down your phone, and look at your food. Smell it. Chew slowly, and don't put another bite on your fork until you swallow. When women ate lunch this attentively, they consumed 30 percent less when snacking later than those who listened to an audiobook at lunchtime, according to a  study in the [place] of Nutrition. 8. Weighing Yourself Really Works The scale provides the best evidence about whether your efforts are paying off. Seeing the numbers tick up or down or stagnate is motivation to keep going-or to rethink your approach. A 2015 study at [facility] found that daily weigh-ins helped people lose more weight, keep it off, and maintain that loss, even after two years. Use it to lose it. Step on the scale at the same time every day for the best results. If your weight shoots up several pounds from one weigh-in to the next, don't freak out. Eating a lot of salt the night before or having your period is the likely culprit. The number should return to normal in a day or two. It's a steady climb that you need to do something about. 9. Too Much Stress and Too Little Sleep Are Your Enemies When you're tired and frazzled, your body cranks up the production of cortisol, the stress hormone that can cause carb cravings. Not getting enough sleep also boosts your levels of ghrelin, a hormone associated with hunger, while suppressing leptin, a hormone that signals fullness and satiety. People on a diet who slept only five and a half hours a night for two weeks lost 55 percent less fat and were hungrier than those who slept eight and a half hours, according to a study in the [place]. Use it to lose it. Prioritize sleep, aiming for seven hours or more a night, which research shows helps lower stress. And make sure you're getting quality zzz's. If a snoring spouse or a fidgety cat wakes you up frequently throughout the night, you may end up getting the equivalent of just four hours of sleep, according to a study from [facility]. Keep pets out of the bedroom, and use a white-noise app to drown out snoring. 10. You Will Hit a plateau-And You Can Bust Through It As you slim down, your body releases much less leptin, the fullness hormone.  If you're  not strength training, start right now. Building muscle can raise your metabolism  to help you overcome a plateau. To keep your body challenged and burning calories, incorporate new moves and more intense intervals into your workouts or add another sweat session to your weekly routine. Alternatively, cut an extra 100 calories or so a day from your diet. Now that you've lost weight, your body simply doesn't need as much fuel.

## 2016-01-12 NOTE — Progress Notes (Signed)
MEDICARE ANNUAL WELLNESS VISIT AND 3 month follow up  Assessment:   1. Labile hypertension - continue medications, DASH diet, exercise and monitor at home. Call if greater than 130/80.  - CBC with Differential/Platelet - BASIC METABOLIC PANEL WITH GFR - Hepatic function panel - TSH  2. Prediabetes Discussed general issues about diabetes pathophysiology and management., Educational material distributed., Suggested low cholesterol diet., Encouraged aerobic exercise., Discussed foot care., Reminded to get yearly retinal exam. - Hemoglobin A1c - Insulin, fasting - HM DIABETES FOOT EXAM  3. Hyperlipidemia -continue medications, check lipids, decrease fatty foods, increase activity.  - Lipid panel  4. Morbid obesity Obesity with co morbidities- long discussion about weight loss, diet, and exercise  5. Vitamin D deficiency - Vit D  25 hydroxy (rtn osteoporosis monitoring)  6. S/P left THA, AA  7. Gastroesophageal reflux disease with esophagitis Continue PPI/H2 blocker, diet discussed  8. Medication management - Magnesium  9.  NASH (nonalcoholic steatohepatitis) Check labs, avoid tylenol, alcohol, weight loss advised.     Plan:   During the course of the visit the patient was educated and counseled about appropriate screening and preventive services including:    Pneumococcal vaccine   Influenza vaccine  Td vaccine  Screening electrocardiogram  Bone densitometry screening  Colorectal cancer screening  Diabetes screening  Glaucoma screening  Nutrition counseling   Advanced directives: requested  Conditions/risks identified: BMI: Discussed weight loss, diet, and increase physical activity.  Increase physical activity: AHA recommends 150 minutes of physical activity a week.  Medications reviewed Diabetes is at goal, ACE/ARB therapy: Yes. Urinary Incontinence is not an issue: discussed non pharmacology and pharmacology options.  Fall risk: low- discussed  PT, home fall assessment, medications.    Subjective:  Anita Blake is a 70 y.o. female who presents for Medicare Annual Wellness Visit and 3 month follow up  Date of last medicare wellness visit was 2016   Her blood pressure has been controlled at home, today their BP is BP: 118/76 mmHg She does workout, works out in her garden and occ walks. She denies chest pain, shortness of breath, dizziness.  She is on cholesterol medication, livalo due to intolerance and denies myalgias. Her cholesterol is at goal. The cholesterol last visit was:   Lab Results  Component Value Date   CHOL 186 09/07/2015   HDL 56 09/07/2015   LDLCALC 109 09/07/2015   TRIG 106 09/07/2015   CHOLHDL 3.3 09/07/2015    She has been working on diet and exercise for prediabetes, and denies paresthesia of the feet, polydipsia, polyuria and visual disturbances. Last A1C in the office was:  Lab Results  Component Value Date   HGBA1C 6.2* 09/07/2015   Patient is on Vitamin D supplement.   Lab Results  Component Value Date   VD25OH 43 09/07/2015     BMI is Body mass index is 44.04 kg/(m^2)., she is working on diet and exercise. Husband has been sick and this has been stressing her out, he can no longer drive due to his sight. Has retired. She does have a history of NASH.  Wt Readings from Last 3 Encounters:  01/12/16 252 lb 9.6 oz (114.579 kg)  09/07/15 245 lb 6.4 oz (111.313 kg)  06/02/15 241 lb (109.317 kg)   Occ left medial knee pain but intermittent.   Medication Review: Current Outpatient Prescriptions on File Prior to Visit  Medication Sig Dispense Refill  . aspirin 81 MG tablet Take 81 mg by mouth daily.    Marland Kitchen  Cholecalciferol (VITAMIN D PO) Take 5,000 Int'l Units by mouth daily.    Marland Kitchen LIVALO 4 MG TABS TAKE 1 TABLET BY MOUTH NIGHTLY 30 tablet 1  . magnesium 30 MG tablet Take 30 mg by mouth daily.     No current facility-administered medications on file prior to visit.    Current Problems (verified) Patient  Active Problem List   Diagnosis Date Noted  . Medicare annual wellness visit, subsequent 09/07/2015  . Medication management 09/07/2015  . NASH (nonalcoholic steatohepatitis) 05/10/2015  . Vitamin D deficiency 01/11/2014  . Labile hypertension   . Hyperlipidemia   . GERD (gastroesophageal reflux disease)   . Prediabetes   . Morbid obesity (BMI 42.79) 10/07/2013  . S/P left THA, AA 10/06/2013  . Cataract, nuclear 10/30/2012  . Macular hole 10/25/2011    Screening Tests Immunization History  Administered Date(s) Administered  . DT 05/10/2015  . Td 07/27/2005    Preventative care: Last colonoscopy: 2011 [facility]) , had negative cologuard 05/2015 Last mammogram: 2016 Last pap smear/pelvic exam: 03/2014  DEXA:2002 Neg CT head 09/2013 [place] AB 2012 US Soft tissues 2011  Prior vaccinations: TD or Tdap: 2016 Influenza: declines Pneumococcal: declines Prevnar13: declines Shingles/Zostavax: declines  Names of Other Physician/Practitioners you currently use: 1. [facility] here for primary care 2. Dr. Kathrin Penner, eye doctor,april 2016 3. Dr. Randol Kern, dentist, last visit 2 months ago Patient Care Team: Unk Pinto, MD as PCP - General (Internal Medicine) Richmond Campbell, MD as Consulting Physician (Gastroenterology) Delila Pereyra, MD as Consulting Physician (Gynecology) Paralee Cancel, MD as Consulting Physician (Orthopedic Surgery)  SURGICAL HISTORY Past Surgical History  Procedure Laterality Date  . Abdominal hysterectomy    . Eye surgery Left 2007    macular hole in retina  . Cataract surgery Left   . Right arm elbow surgery      ligament repair  . Index finger surgery Right   . Total hip arthroplasty Left 10/06/2013    Procedure: LEFT TOTAL HIP ARTHROPLASTY ANTERIOR APPROACH;  Surgeon: Mauri Pole, MD;  Location: [facility];  Service: Orthopedics;  Laterality: Left;   FAMILY HISTORY Family History  Problem Relation Age of  Onset  . Heart disease Mother   . Heart attack Mother   . Stroke Father   . Alzheimer's disease Other    SOCIAL HISTORY Social History  Substance Use Topics  . Smoking status: Never Smoker   . Smokeless tobacco: Never Used  . Alcohol Use: No    MEDICARE WELLNESS OBJECTIVES: Tobacco use: She does not smoke.  Patient is not a former smoker. Alcohol Current alcohol use: none Caffeine Current caffeine use: denies use Osteoporosis: postmenopausal estrogen deficiency and dietary calcium and/or vitamin D deficiency, History of fracture in the past year: no Diet: in general, a "healthy" diet   Physical activity: walking Depression/mood screen:   Depression screen [facility] 2/9 01/12/2016  Decreased Interest 0  Down, Depressed, Hopeless 0  PHQ - 2 Score 0   Hearing: impaired Visual acuity: impaired,  does perform annual eye exam  ADLs:  In your present state of health, do you have any difficulty performing the following activities: 01/12/2016 05/10/2015  Hearing? N N  Vision? Y Y  Difficulty concentrating or making decisions? N N  Walking or climbing stairs? N N  Dressing or bathing? N N  Doing errands, shopping? N N  Preparing Food and eating ? N N  Using the Toilet? N N  In the past six months, have  you accidently leaked urine? N N  Do you have problems with loss of bowel control? N N  Managing your Medications? N N  Managing your Finances? N N  Housekeeping or managing your Housekeeping? N N    Fall risk: Low Risk Cognitive Testing  Alert? Yes  Normal Appearance?Yes  Oriented to person? Yes  Place? Yes   Time? Yes  Recall of three objects?  Yes  Can perform simple calculations? Yes  Displays appropriate judgment?Yes  Can read the correct time from a watch face?Yes  EOL planning: Does patient have an advance directive?: Yes Type of Advance Directive: [place], Living will Does patient want to make changes to advanced directive?: No - Patient declined Copy  of advanced directive(s) in chart?: No - copy requested     Objective:     Blood pressure 118/76, pulse 76, temperature 97.9 F (36.6 C), temperature source Temporal, resp. rate 16, height 5' 3.5" (1.613 m), weight 252 lb 9.6 oz (114.579 kg), SpO2 97 %. Body mass index is 44.04 kg/(m^2).  General appearance: alert, no distress, WD/WN, female HEENT: normocephalic, sclerae anicteric, TMs pearly, nares patent, no discharge or erythema, pharynx normal Oral cavity: MMM, no lesions Neck: supple, no lymphadenopathy, no thyromegaly, no masses Heart: RRR, normal S1, S2, no murmurs Lungs: CTA bilaterally, no wheezes, rhonchi, or rales Abdomen: +bs, soft, obese, non tender, non distended, no masses, no hepatomegaly, no splenomegaly Musculoskeletal: nontender, no swelling, no obvious deformity Extremities: no edema, no cyanosis, no clubbing Pulses: 2+ symmetric, upper and lower extremities, normal cap refill Neurological: alert, oriented x 3, CN2-12 intact, strength normal upper extremities and lower extremities, sensation normal throughout, DTRs 2+ throughout, no cerebellar signs, gait normal Psychiatric: normal affect, behavior normal, pleasant    Medicare Attestation I have personally reviewed: The patient's medical and social history Their use of alcohol, tobacco or illicit drugs Their current medications and supplements The patient's functional ability including ADLs,fall risks, home safety risks, cognitive, and hearing and visual impairment Diet and physical activities Evidence for depression or mood disorders  The patient's weight, height, BMI, and visual acuity have been recorded in the chart.  I have made referrals, counseling, and provided education to the patient based on review of the above and I have provided the patient with a written personalized care plan for preventive services.     [name], PA-C   01/12/2016

## 2016-01-13 LAB — VITAMIN D 25 HYDROXY (VIT D DEFICIENCY, FRACTURES): VIT D 25 HYDROXY: 74 ng/mL (ref 30–100)

## 2016-02-06 ENCOUNTER — Other Ambulatory Visit: Payer: Self-pay | Admitting: *Deleted

## 2016-02-06 MED ORDER — PITAVASTATIN CALCIUM 4 MG PO TABS: 1.0000 | ORAL_TABLET | Freq: Every day | ORAL | Status: DC

## 2016-04-13 ENCOUNTER — Other Ambulatory Visit: Payer: Self-pay

## 2016-04-13 DIAGNOSIS — Z1231 Encounter for screening mammogram for malignant neoplasm of breast: Secondary | ICD-10-CM

## 2016-04-14 ENCOUNTER — Other Ambulatory Visit: Payer: Self-pay | Admitting: Internal Medicine

## 2016-05-09 ENCOUNTER — Encounter: Payer: Self-pay | Admitting: Physician Assistant

## 2016-05-09 ENCOUNTER — Ambulatory Visit (HOSPITAL_COMMUNITY)
Admission: RE | Admit: 2016-05-09 | Discharge: 2016-05-09 | Disposition: A | Discharge status: Home / Self Care | Payer: Medicare Other | Source: Ambulatory Visit | Attending: Physician Assistant | Admitting: Physician Assistant

## 2016-05-09 ENCOUNTER — Ambulatory Visit (INDEPENDENT_AMBULATORY_CARE_PROVIDER_SITE_OTHER): Payer: Medicare Other | Admitting: Physician Assistant

## 2016-05-09 VITALS — BP 130/60 | HR 85 | Temp 97.5°F | Resp 16 | Ht 63.5 in | Wt 248.0 lb

## 2016-05-09 DIAGNOSIS — R0989 Other specified symptoms and signs involving the circulatory and respiratory systems: Secondary | ICD-10-CM

## 2016-05-09 DIAGNOSIS — Z0001 Encounter for general adult medical examination with abnormal findings: Secondary | ICD-10-CM | POA: Diagnosis not present

## 2016-05-09 DIAGNOSIS — R6889 Other general symptoms and signs: Secondary | ICD-10-CM | POA: Diagnosis not present

## 2016-05-09 DIAGNOSIS — R7303 Prediabetes: Secondary | ICD-10-CM

## 2016-05-09 DIAGNOSIS — Z136 Encounter for screening for cardiovascular disorders: Secondary | ICD-10-CM

## 2016-05-09 DIAGNOSIS — I10 Essential (primary) hypertension: Secondary | ICD-10-CM

## 2016-05-09 DIAGNOSIS — R0600 Dyspnea, unspecified: Secondary | ICD-10-CM

## 2016-05-09 DIAGNOSIS — K219 Gastro-esophageal reflux disease without esophagitis: Secondary | ICD-10-CM | POA: Diagnosis not present

## 2016-05-09 DIAGNOSIS — Z9660 Presence of unspecified orthopedic joint implant: Secondary | ICD-10-CM

## 2016-05-09 DIAGNOSIS — Z79899 Other long term (current) drug therapy: Secondary | ICD-10-CM

## 2016-05-09 DIAGNOSIS — E559 Vitamin D deficiency, unspecified: Secondary | ICD-10-CM

## 2016-05-09 DIAGNOSIS — E785 Hyperlipidemia, unspecified: Secondary | ICD-10-CM

## 2016-05-09 DIAGNOSIS — K7581 Nonalcoholic steatohepatitis (NASH): Secondary | ICD-10-CM

## 2016-05-09 DIAGNOSIS — Z96649 Presence of unspecified artificial hip joint: Secondary | ICD-10-CM

## 2016-05-09 LAB — BASIC METABOLIC PANEL WITH GFR
BUN: 15 mg/dL (ref 7–25)
CHLORIDE: 99 mmol/L (ref 98–110)
CO2: 29 mmol/L (ref 20–31)
Calcium: 9.4 mg/dL (ref 8.6–10.4)
Creat: 0.6 mg/dL (ref 0.50–0.99)
Glucose, Bld: 91 mg/dL (ref 65–99)
POTASSIUM: 4.2 mmol/L (ref 3.5–5.3)
Sodium: 139 mmol/L (ref 135–146)

## 2016-05-09 LAB — CBC WITH DIFFERENTIAL/PLATELET
BASOS PCT: 0 %
Basophils Absolute: 0 cells/uL (ref 0–200)
EOS ABS: 202 {cells}/uL (ref 15–500)
Eosinophils Relative: 2 %
HEMATOCRIT: 41 % (ref 35.0–45.0)
Hemoglobin: 12.8 g/dL (ref 11.7–15.5)
LYMPHS PCT: 25 %
Lymphs Abs: 2525 cells/uL (ref 850–3900)
MCH: 25.1 pg — ABNORMAL LOW (ref 27.0–33.0)
MCHC: 31.2 g/dL — ABNORMAL LOW (ref 32.0–36.0)
MCV: 80.4 fL (ref 80.0–100.0)
MONO ABS: 707 {cells}/uL (ref 200–950)
MONOS PCT: 7 %
MPV: 10.5 fL (ref 7.5–12.5)
NEUTROS ABS: 6666 {cells}/uL (ref 1500–7800)
Neutrophils Relative %: 66 %
PLATELETS: 294 10*3/uL (ref 140–400)
RBC: 5.1 MIL/uL (ref 3.80–5.10)
RDW: 15.4 % — AB (ref 11.0–15.0)
WBC: 10.1 10*3/uL (ref 3.8–10.8)

## 2016-05-09 LAB — LIPID PANEL
Cholesterol: 185 mg/dL (ref 125–200)
HDL: 66 mg/dL (ref >=46)
LDL CALC: 92 mg/dL (ref <130)
TRIGLYCERIDES: 136 mg/dL (ref <150)
Total CHOL/HDL Ratio: 2.8 Ratio (ref <=5.0)
VLDL: 27 mg/dL (ref <30)

## 2016-05-09 LAB — HEPATIC FUNCTION PANEL
ALK PHOS: 50 U/L (ref 33–130)
ALT: 20 U/L (ref 6–29)
AST: 18 U/L (ref 10–35)
Albumin: 4.3 g/dL (ref 3.6–5.1)
BILIRUBIN INDIRECT: 0.7 mg/dL (ref 0.2–1.2)
BILIRUBIN TOTAL: 0.8 mg/dL (ref 0.2–1.2)
Bilirubin, Direct: 0.1 mg/dL (ref <=0.2)
Total Protein: 7.4 g/dL (ref 6.1–8.1)

## 2016-05-09 LAB — HEMOGLOBIN A1C
HEMOGLOBIN A1C: 6.2 % — AB (ref <5.7)
Mean Plasma Glucose: 131 mg/dL

## 2016-05-09 LAB — TSH: TSH: 2.01 m[IU]/L

## 2016-05-09 LAB — MAGNESIUM: MAGNESIUM: 1.8 mg/dL (ref 1.5–2.5)

## 2016-05-09 MED ORDER — ALBUTEROL SULFATE HFA 108 (90 BASE) MCG/ACT IN AERS: 2.0000 | INHALATION_SPRAY | RESPIRATORY_TRACT | Status: DC | PRN

## 2016-05-09 NOTE — Patient Instructions (Signed)

## 2016-05-09 NOTE — Progress Notes (Signed)
CPE  Assessment:   1. Labile hypertension - continue medications, DASH diet, exercise and monitor at home. Call if greater than 130/80.  - CBC with Differential/Platelet - BASIC METABOLIC PANEL WITH GFR - Hepatic function panel - TSH - Urinalysis, Routine w reflex microscopic - Microalbumin / creatinine urine ratio - EKG 12-Lead  2. Prediabetes Discussed general issues about diabetes pathophysiology and management., Educational material distributed., Suggested low cholesterol diet., Encouraged aerobic exercise., Discussed foot care., Reminded to get yearly retinal exam. - Hemoglobin A1c - Insulin, fasting - HM DIABETES FOOT EXAM  3. Hyperlipidemia -continue medications, check lipids, decrease fatty foods, increase activity.  - Lipid panel  4. Morbid obesity Obesity with co morbidities- long discussion about weight loss, diet, and exercise  5. Vitamin D deficiency - Vit D  25 hydroxy (rtn osteoporosis monitoring)  6. S/P left THA, AA  7. Gastroesophageal reflux disease with esophagitis Continue PPI/H2 blocker, diet discussed  8. Medication management - Magnesium  9.  NASH (nonalcoholic steatohepatitis) Check labs, avoid tylenol, alcohol, weight loss advised.   10.Routine general medical examination at a health care facility  11. Dyspnea with exertion 69 obese AAF with history of chol, HTN, with family history of heart disease/stroke in mother and father, in 26's.  Check CXR, get EKG, labs, albuterol given, ? Anginal equivalent versus deconditioning/stress- will refer to cardiology. No weight gain, no PND, orthopnea.    Subjective:  Anita Blake is a 70 y.o. female who presents for complete physical.     Her blood pressure has been controlled at home, today their BP is BP: 130/60 mmHg She does workout, works out in her garden and occ walks. She denies chest pain,  dizziness. She states she did have some shortness of breath while going up a ramp which was new several  weeks ago, caught breath after 1 mins, had nonproductive cough, some sweating, no nausea, CP, dizziness with it. Has happened one other time with push mowing yard. She states she will get a dry cough frequently, but denies fever, chills, mucus, etc. No allergy/GERD symptoms.  She states she has been very fatigued. Stable weight, no leg swelling, no palpitations.   She is on cholesterol medication, livalo due to intolerance and denies myalgias. Her cholesterol is at goal. The cholesterol last visit was:   Lab Results  Component Value Date   CHOL 191 01/12/2016   HDL 56 01/12/2016   LDLCALC 114 01/12/2016   TRIG 104 01/12/2016   CHOLHDL 3.4 01/12/2016    She has been working on diet and exercise for prediabetes, and denies paresthesia of the feet, polydipsia, polyuria and visual disturbances. Last A1C in the office was:  Lab Results  Component Value Date   HGBA1C 6.3* 01/12/2016   Patient is on Vitamin D supplement.   Lab Results  Component Value Date   VD25OH 74 01/12/2016     BMI is Body mass index is 43.24 kg/(m^2)., she is working on diet and exercise. Husband has been sick and this has been stressing her out, he can no longer drive due to his sight, she has retired. He recently had mass removed from adrenal gland and nephrectomy, unknown path at this time.  She does have a history of NASH.  Wt Readings from Last 3 Encounters:  05/09/16 248 lb (112.492 kg)  01/12/16 252 lb 9.6 oz (114.579 kg)  09/07/15 245 lb 6.4 oz (111.313 kg)    Medication Review: Current Outpatient Prescriptions on File Prior to Visit  Medication Sig Dispense Refill  . aspirin 81 MG tablet Take 81 mg by mouth daily.    . Cholecalciferol (VITAMIN D PO) Take 5,000 Int'l Units by mouth daily.    Marland Kitchen LIVALO 4 MG TABS TAKE 1 TABLET(4 MG) BY MOUTH AT BEDTIME 90 tablet 0  . magnesium 30 MG tablet Take 30 mg by mouth daily.     No current facility-administered medications on file prior to visit.    Current Problems  (verified) Patient Active Problem List   Diagnosis Date Noted  . Medicare annual wellness visit, subsequent 09/07/2015  . Medication management 09/07/2015  . NASH (nonalcoholic steatohepatitis) 05/10/2015  . Vitamin D deficiency 01/11/2014  . Labile hypertension   . Hyperlipidemia   . GERD (gastroesophageal reflux disease)   . Prediabetes   . Morbid obesity (BMI 42.79) 10/07/2013  . S/P left THA, AA 10/06/2013  . Cataract, nuclear 10/30/2012  . Macular hole 10/25/2011    Screening Tests Immunization History  Administered Date(s) Administered  . DT 05/10/2015  . Td 07/27/2005   Preventative care: Last colonoscopy: negative cologuard 2016 Last mammogram: 2016 Last pap smear/pelvic exam: 03/2014  DEXA:2002 Neg CT head 09/2013 [place] AB 2012 US Soft tissues 2011  Prior vaccinations: TD or Tdap: 2016 Influenza: declines Pneumococcal: declines Prevnar13: declines Shingles/Zostavax: declines  Names of Other Physician/Practitioners you currently use: 1. [facility] here for primary care 2. Dr. Kathrin Penner, eye doctor,april 2016 3. Dr. Randol Kern, dentist, last visit 2 months ago Patient Care Team: Unk Pinto, MD as PCP - General (Internal Medicine) Richmond Campbell, MD as Consulting Physician (Gastroenterology) Delila Pereyra, MD as Consulting Physician (Gynecology) Paralee Cancel, MD as Consulting Physician (Orthopedic Surgery)  SURGICAL HISTORY Past Surgical History  Procedure Laterality Date  . Abdominal hysterectomy    . Eye surgery Left 2007    macular hole in retina  . Cataract surgery Left   . Right arm elbow surgery      ligament repair  . Index finger surgery Right   . Total hip arthroplasty Left 10/06/2013    Procedure: LEFT TOTAL HIP ARTHROPLASTY ANTERIOR APPROACH;  Surgeon: Mauri Pole, MD;  Location: [facility];  Service: Orthopedics;  Laterality: Left;   FAMILY HISTORY Family History  Problem Relation Age of Onset  .  Heart disease Mother   . Heart attack Mother   . Stroke Father   . Alzheimer's disease Other    SOCIAL HISTORY Social History  Substance Use Topics  . Smoking status: Never Smoker   . Smokeless tobacco: Never Used  . Alcohol Use: No    Objective:     Blood pressure 130/60, pulse 85, temperature 97.5 F (36.4 C), temperature source Temporal, resp. rate 16, height 5' 3.5" (1.613 m), weight 248 lb (112.492 kg), SpO2 97 %. Body mass index is 43.24 kg/(m^2).  General appearance: alert, no distress, WD/WN, female HEENT: normocephalic, sclerae anicteric, TMs pearly, nares patent, no discharge or erythema, pharynx normal Oral cavity: MMM, no lesions Neck: supple, no lymphadenopathy, no thyromegaly, no masses Heart: RRR, normal S1, S2, no murmurs Lungs: CTA bilaterally, no wheezes, rhonchi, or rales Abdomen: +bs, soft, obese, non tender, non distended, no masses, no hepatomegaly, no splenomegaly Musculoskeletal: nontender, no swelling, no obvious deformity Extremities: no edema, no cyanosis, no clubbing Pulses: 2+ symmetric, upper and lower extremities, normal cap refill Neurological: alert, oriented x 3, CN2-12 intact, strength normal upper extremities and lower extremities, sensation normal throughout, DTRs 2+ throughout, no cerebellar signs, gait normal  Psychiatric: normal affect, behavior normal, pleasant  Skin: no abnormal nevus, rashes, or lesions.  Breasts: symmetric fibrous changes in both upper outer quadrants.     [name], PA-C   05/09/2016

## 2016-05-10 ENCOUNTER — Encounter: Payer: Self-pay | Admitting: Cardiology

## 2016-05-10 LAB — URINALYSIS, ROUTINE W REFLEX MICROSCOPIC
BILIRUBIN URINE: NEGATIVE
GLUCOSE, UA: NEGATIVE
HGB URINE DIPSTICK: NEGATIVE
Ketones, ur: NEGATIVE
Leukocytes, UA: NEGATIVE
Nitrite: NEGATIVE
PROTEIN: NEGATIVE
Specific Gravity, Urine: 1.021 (ref 1.001–1.035)
pH: 6.5 (ref 5.0–8.0)

## 2016-05-10 LAB — VITAMIN D 25 HYDROXY (VIT D DEFICIENCY, FRACTURES): VIT D 25 HYDROXY: 63 ng/mL (ref 30–100)

## 2016-05-11 ENCOUNTER — Ambulatory Visit (INDEPENDENT_AMBULATORY_CARE_PROVIDER_SITE_OTHER): Payer: Medicare Other | Admitting: Cardiology

## 2016-05-11 ENCOUNTER — Encounter: Payer: Self-pay | Admitting: Cardiology

## 2016-05-11 VITALS — BP 132/81 | HR 88 | Ht 63.5 in | Wt 255.0 lb

## 2016-05-11 DIAGNOSIS — R0600 Dyspnea, unspecified: Secondary | ICD-10-CM

## 2016-05-11 LAB — URINE CULTURE: Colony Count: 25000

## 2016-05-11 NOTE — Patient Instructions (Signed)
Your physician recommends that you schedule a follow-up appointment in: As Needed  Your physician has requested that you have an exercise tolerance test. For further information please visit [email or web address]. Please also follow instruction sheet, as given.

## 2016-05-11 NOTE — Progress Notes (Signed)
Cardiology Office Note   Date:  05/11/2016   ID:  Anita Blake, DOB 12-06-1946, MRN [id number]  PCP:  Alesia Richards, MD  Cardiologist:   Minus Breeding, MD   No chief complaint on file.     History of Present Illness: Anita Blake is a 70 y.o. female who presents for evaluation of dyspnea. She had this in April. [place] and felt short of breath walking the incline which was unusual for her. Then later that month while mowing the lawn she had same sensation. However, now that seems to be improved. She thinks it's related to her asthma. She is able to mow the lawn now without shortness of breath. She has not had any chest pressure, neck or arm discomfort. She does not describe palpitations, presyncope or syncope. She doesn't have any resting shortness of breath, PND or orthopnea. She has never had a cardiac workup or history before.  Past Medical History  Diagnosis Date  . Anemia yrs ago  . Complication of anesthesia     slow to wake up  . Labile hypertension     no meds  . Hyperlipidemia   . Arthritis   . GERD (gastroesophageal reflux disease)   . Prediabetes   . Nonalcoholic steatohepatitis (NASH)   . Vitamin D deficiency     Past Surgical History  Procedure Laterality Date  . Abdominal hysterectomy    . Eye surgery Left 2007    macular hole in retina  . Cataract surgery Left   . Right arm elbow surgery      ligament repair  . Index finger surgery Right   . Total hip arthroplasty Left 10/06/2013    Procedure: LEFT TOTAL HIP ARTHROPLASTY ANTERIOR APPROACH;  Surgeon: Mauri Pole, MD;  Location: [facility];  Service: Orthopedics;  Laterality: Left;     Current Outpatient Prescriptions  Medication Sig Dispense Refill  . albuterol (VENTOLIN HFA) 108 (90 Base) MCG/ACT inhaler Inhale 2 puffs into the lungs every 4 (four) hours as needed for wheezing or shortness of breath. 1 Inhaler 0  . aspirin 81 MG tablet Take 81 mg by mouth daily.      . Cholecalciferol (VITAMIN D PO) Take 5,000 Int'l Units by mouth daily.    Marland Kitchen LIVALO 4 MG TABS TAKE 1 TABLET(4 MG) BY MOUTH AT BEDTIME 90 tablet 0  . magnesium 30 MG tablet Take 30 mg by mouth daily.     No current facility-administered medications for this visit.    Allergies:   Anesthetics, halogenated; Crestor; Lopid; and Latex    Social History:  The patient  reports that she has never smoked. She has never used smokeless tobacco. She reports that she does not drink alcohol or use illicit drugs.   Family History:  The patient's family history includes Alzheimer's disease in her other; Heart attack (age of onset: 7) in her sister; Heart attack (age of onset: 76) in her brother; Heart disease in her mother; Stroke in her father.    ROS:  Please see the history of present illness.   Otherwise, review of systems are positive for none.   All other systems are reviewed and negative.    PHYSICAL EXAM: VS:  BP 132/81 mmHg  Pulse 88  Ht 5' 3.5" (1.613 m)  Wt 255 lb (115.667 kg)  BMI 44.46 kg/m2 , BMI Body mass index is 44.46 kg/(m^2). GENERAL:  Well appearing HEENT:  Pupils equal round and reactive, fundi  not visualized, oral mucosa unremarkable NECK:  No jugular venous distention, waveform within normal limits, carotid upstroke brisk and symmetric, no bruits, no thyromegaly LYMPHATICS:  No cervical, inguinal adenopathy LUNGS:  Clear to auscultation bilaterally BACK:  No CVA tenderness CHEST:  Unremarkable HEART:  PMI not displaced or sustained,S1 and S2 within normal limits, no S3, no S4, no clicks, no rubs, no murmurs ABD:  Flat, positive bowel sounds normal in frequency in pitch, no bruits, no rebound, no guarding, no midline pulsatile mass, no hepatomegaly, no splenomegaly EXT:  2 plus pulses throughout, no edema, no cyanosis no clubbing SKIN:  No rashes no nodules NEURO:  Cranial nerves II through XII grossly intact, motor grossly intact throughout PSYCH:  Cognitively intact,  oriented to person place and time    EKG:  EKG is not ordered today. The ekg ordered 5/24 demonstrates NSR, rate 63, axis within normal limits, intervals within normal limits, no acute ST-T wave changes.   Recent Labs: 05/09/2016: ALT 20; BUN 15; Creat 0.60; Hemoglobin 12.8; Magnesium 1.8; Platelets 294; Potassium 4.2; Sodium 139; TSH 2.01    Lipid Panel    Component Value Date/Time   CHOL 185 05/09/2016 1104   TRIG 136 05/09/2016 1104   HDL 66 05/09/2016 1104   CHOLHDL 2.8 05/09/2016 1104   VLDL 27 05/09/2016 1104   LDLCALC 92 05/09/2016 1104      Wt Readings from Last 3 Encounters:  05/11/16 255 lb (115.667 kg)  05/09/16 248 lb (112.492 kg)  01/12/16 252 lb 9.6 oz (114.579 kg)      Other studies Reviewed: Additional studies/ records that were reviewed today include: EKG and office records, labs. Review of the above records demonstrates:  Please see elsewhere in the note.     ASSESSMENT AND PLAN:  DYSPNEA:  The patient has minimal cardiovascular risk factors. Her symptoms are somewhat atypical. However, because of the risk factors screening with a reasonable. I will bring the patient back for a POET (Plain Old Exercise Test). This will allow me to screen for obstructive coronary disease, risk stratify and very importantly provide a prescription for exercise.  WEIGHT:  The patient understands the need to lose weight with diet and exercise. We have discussed specific strategies for this.  DYSLIPIDEMIA:  She does not meet class I indications for statin and we discussed this. I don't think is unreasonable to continue and she will. Her lipid profile is excellent.  Current medicines are reviewed at length with the patient today.  The patient does not have concerns regarding medicines.  The following changes have been made:  no change  Labs/ tests ordered today include:   Orders Placed This Encounter  Procedures  . Exercise Tolerance Test     Disposition:   FU with me  as needed.     Signed, Minus Breeding, MD  05/11/2016 10:29 AM    [facility]

## 2016-05-18 ENCOUNTER — Telehealth (HOSPITAL_COMMUNITY): Payer: Self-pay

## 2016-05-18 NOTE — Telephone Encounter (Signed)
Encounter complete. 

## 2016-05-21 ENCOUNTER — Ambulatory Visit
Admission: RE | Admit: 2016-05-21 | Discharge: 2016-05-21 | Disposition: A | Discharge status: Home / Self Care | Payer: Medicare Other | Source: Ambulatory Visit

## 2016-05-21 DIAGNOSIS — Z1231 Encounter for screening mammogram for malignant neoplasm of breast: Secondary | ICD-10-CM

## 2016-05-23 ENCOUNTER — Ambulatory Visit (HOSPITAL_COMMUNITY)
Admission: RE | Admit: 2016-05-23 | Discharge: 2016-05-23 | Disposition: A | Discharge status: Home / Self Care | Payer: Medicare Other | Source: Ambulatory Visit | Attending: Cardiovascular Disease | Admitting: Cardiovascular Disease

## 2016-05-23 DIAGNOSIS — R0600 Dyspnea, unspecified: Secondary | ICD-10-CM | POA: Diagnosis not present

## 2016-05-23 DIAGNOSIS — R9439 Abnormal result of other cardiovascular function study: Secondary | ICD-10-CM | POA: Insufficient documentation

## 2016-05-24 LAB — EXERCISE TOLERANCE TEST
CHL CUP MPHR: 153 {beats}/min
CHL CUP RESTING HR STRESS: 73 {beats}/min
CHL RATE OF PERCEIVED EXERTION: 17
CSEPEW: 5.8 METS
CSEPHR: 100 %
CSEPPHR: 153 {beats}/min
Exercise duration (min): 4 min
Exercise duration (sec): 0 s

## 2016-07-18 ENCOUNTER — Encounter: Payer: Self-pay | Admitting: Physician Assistant

## 2016-07-18 ENCOUNTER — Other Ambulatory Visit: Payer: Self-pay | Admitting: Physician Assistant

## 2016-07-18 ENCOUNTER — Ambulatory Visit (HOSPITAL_COMMUNITY)
Admission: RE | Admit: 2016-07-18 | Discharge: 2016-07-18 | Disposition: A | Discharge status: Home / Self Care | Payer: Medicare Other | Source: Ambulatory Visit | Attending: Physician Assistant | Admitting: Physician Assistant

## 2016-07-18 ENCOUNTER — Ambulatory Visit (INDEPENDENT_AMBULATORY_CARE_PROVIDER_SITE_OTHER): Payer: Medicare Other | Admitting: Physician Assistant

## 2016-07-18 VITALS — BP 160/100 | HR 81 | Temp 97.9°F | Resp 16 | Ht 63.5 in | Wt 261.6 lb

## 2016-07-18 DIAGNOSIS — M79651 Pain in right thigh: Secondary | ICD-10-CM | POA: Diagnosis not present

## 2016-07-18 DIAGNOSIS — M25551 Pain in right hip: Secondary | ICD-10-CM | POA: Insufficient documentation

## 2016-07-18 LAB — CBC WITH DIFFERENTIAL/PLATELET
BASOS PCT: 0 %
Basophils Absolute: 0 cells/uL (ref 0–200)
EOS ABS: 267 {cells}/uL (ref 15–500)
Eosinophils Relative: 3 %
HEMATOCRIT: 36.8 % (ref 35.0–45.0)
HEMOGLOBIN: 11.7 g/dL (ref 11.7–15.5)
LYMPHS ABS: 3115 {cells}/uL (ref 850–3900)
Lymphocytes Relative: 35 %
MCH: 25.2 pg — ABNORMAL LOW (ref 27.0–33.0)
MCHC: 31.8 g/dL — ABNORMAL LOW (ref 32.0–36.0)
MCV: 79.1 fL — AB (ref 80.0–100.0)
MONO ABS: 623 {cells}/uL (ref 200–950)
MPV: 10.2 fL (ref 7.5–12.5)
Monocytes Relative: 7 %
Neutro Abs: 4895 cells/uL (ref 1500–7800)
Neutrophils Relative %: 55 %
Platelets: 291 10*3/uL (ref 140–400)
RBC: 4.65 MIL/uL (ref 3.80–5.10)
RDW: 15.3 % — AB (ref 11.0–15.0)
WBC: 8.9 10*3/uL (ref 3.8–10.8)

## 2016-07-18 MED ORDER — MELOXICAM 15 MG PO TABS: ORAL_TABLET | ORAL | 1 refills | Status: DC

## 2016-07-18 NOTE — Progress Notes (Signed)
   Subjective:    Patient ID: Anita Blake, female    DOB: 1946/07/17, 70 y.o.   MRN: [id number]  HPI 70 y.o. AAF presents with right thigh pain x this Am at 4. She woke up at 4 in the morning, states it is 10/10. Took 2 5m ASA. Nothing better or worse. Puling sharp pain lateral right thigh pain. Has been treated for plantar fasciitis, has been wearing boot/imboilizer on the right foot at night and during the day for past 2 weeks. Was wearing at night when pain started. No distal swelling, warmth.   Blood pressure (!) 160/100, pulse 81, temperature 97.9 F (36.6 C), resp. rate 16, height 5' 3.5" (1.613 m), weight 261 lb 9.6 oz (118.7 kg), SpO2 98 %.  Medications Current Outpatient Prescriptions on File Prior to Visit  Medication Sig  . albuterol (VENTOLIN HFA) 108 (90 Base) MCG/ACT inhaler Inhale 2 puffs into the lungs every 4 (four) hours as needed for wheezing or shortness of breath.  .Marland Kitchenaspirin 81 MG tablet Take 81 mg by mouth daily.  . Cholecalciferol (VITAMIN D PO) Take 5,000 Int'l Units by mouth daily.  .Marland KitchenLIVALO 4 MG TABS TAKE 1 TABLET(4 MG) BY MOUTH AT BEDTIME  . magnesium 30 MG tablet Take 30 mg by mouth daily.   No current facility-administered medications on file prior to visit.     Problem list She has S/P left THA, AA; Morbid obesity (BMI 42.79); Labile hypertension; Hyperlipidemia; GERD (gastroesophageal reflux disease); Prediabetes; Vitamin D deficiency; NASH (nonalcoholic steatohepatitis); Medicare annual wellness visit, subsequent; Medication management; Macular hole; Cataract, nuclear; and Dyspnea on her problem list.   Review of Systems  Constitutional: Negative.   HENT: Negative.   Respiratory: Negative.   Cardiovascular: Negative.   Gastrointestinal: Negative.   Genitourinary: Negative.   Musculoskeletal: Positive for myalgias (right lateral leg).  Skin: Negative.   Neurological: Negative.   Hematological: Negative.   Psychiatric/Behavioral: Negative.        Objective:   Physical Exam  Constitutional: She is oriented to person, place, and time. She appears well-developed and well-nourished.  HENT:  Head: Normocephalic.  Cardiovascular: Normal rate, regular rhythm and normal heart sounds.  Exam reveals no gallop and no friction rub.   No murmur heard. Pulmonary/Chest: Effort normal and breath sounds normal. No respiratory distress. She has no wheezes.  Abdominal: Soft. Bowel sounds are normal. She exhibits no distension and no mass. There is no tenderness. There is no rebound and no guarding.  Musculoskeletal: Normal range of motion. She exhibits no tenderness.  Full ROM right thigh and knee, no pain, no warmth, swelling ,tenderness, negative homen's sign, normal neurovascular distal exam. Nontender lateral right thigh.   Neurological: She is alert and oriented to person, place, and time. She has normal reflexes. She displays normal reflexes. No cranial nerve deficit. Coordination normal.  Skin: Skin is warm and dry.  Psychiatric: She has a normal mood and affect.       Assessment & Plan:  Right thigh pain No swelling, warmth, and nontender Unable to cause pain with movement Will get ddimer since wearing immobilization boot mobic with zantac CPK, and stop livalo for now Check labs Get Xray since deep bone pain

## 2016-07-18 NOTE — Patient Instructions (Signed)
Muscle aches Stop the statin for 1 week and then start back after we get the lab back. We are getting a lab that will look for muscle break down with the statin.  We will check your potassium, magnesium to see if these are low which can cause muscle aches.  Please make sure you are taking 64 oz of water a day as long as you do now have a heart condition.  -Please take Tylenol or Aleve for pain. -You can take tylenol (562m) or tylenol arthritis (6534m. The max you can take of tylenol a day is 300090maily, this is a max of 6 pills a day of the regular tyelnol (500m55mr a max of 4 a day of the tylenol arthritis (650mg6m long as no other medications you are taking contain tylenol.  -Aleve/Naproxen Sodium- can take 1-2 in the morning and 1 at night. Max 3 a day. Take with food to avoid ulcers. Does affect your kidney function so use sparingly.   If you have had tick exposure, we can check for lyme disease or [place] spotted fever.   Muscle Pain Muscle pain (myalgia) may be caused by many things, including:  Overuse or muscle strain, especially if you are not in shape. This is the most common cause of muscle pain.  Injury.  Bruises.  Viruses, such as the flu.  Infectious diseases.  Fibromyalgia, which is a chronic condition that causes muscle tenderness, fatigue, and headache.  Autoimmune diseases, including lupus.  Certain drugs, including ACE inhibitors and statins. Muscle pain may be mild or severe. In most cases, the pain lasts only a short time and goes away without treatment. To diagnose the cause of your muscle pain, your health care provider will take your medical history. This means he or she will ask you when your muscle pain began and what has been happening. If you have not had muscle pain for very long, your health care provider may want to wait before doing much testing. If your muscle pain has lasted a long time, your health care provider may want to run tests right  away. If your health care provider thinks your muscle pain may be caused by illness, you may need to have additional tests to rule out certain conditions.  Treatment for muscle pain depends on the cause. Home care is often enough to relieve muscle pain. Your health care provider may also prescribe anti-inflammatory medicine. HOME CARE INSTRUCTIONS Watch your condition for any changes. The following actions may help to lessen any discomfort you are feeling:  Only take over-the-counter or prescription medicines as directed by your health care provider.  Apply ice to the sore muscle:  Put ice in a plastic bag.  Place a towel between your skin and the bag.  Leave the ice on for 15-20 minutes, 3-4 times a day.  You may alternate applying hot and cold packs to the muscle as directed by your health care provider.  If overuse is causing your muscle pain, slow down your activities until the pain goes away.  Remember that it is normal to feel some muscle pain after starting a workout program. Muscles that have not been used often will be sore at first.  Do regular, gentle exercises if you are not usually active.  Warm up before exercising to lower your risk of muscle pain.  Do not continue working out if the pain is very bad. Bad pain could mean you have injured a muscle. SEEK [place]  IF:  Your muscle pain gets worse, and medicines do not help.  You have muscle pain that lasts longer than 3 days.  You have a rash or fever along with muscle pain.  You have muscle pain after a tick bite.  You have muscle pain while working out, even though you are in good physical condition.  You have redness, soreness, or swelling along with muscle pain.  You have muscle pain after starting a new medicine or changing the dose of a medicine. SEEK IMMEDIATE MEDICAL CARE IF:  You have trouble breathing.  You have trouble swallowing.  You have muscle pain along with a stiff neck, fever, and  vomiting.  You have severe muscle weakness or cannot move part of your body. MAKE SURE YOU:   Understand these instructions.  Will watch your condition.  Will get help right away if you are not doing well or get worse. Document Released: 10/25/2006 Document Revised: 12/08/2013 Document Reviewed: 09/29/2013 [facility] Patient Information 2015 [place], [place]. This information is not intended to replace advice given to you by your health care provider. Make sure you discuss any questions you have with your health care provider.

## 2016-07-19 LAB — HEPATIC FUNCTION PANEL
ALK PHOS: 60 U/L (ref 33–130)
ALT: 38 U/L — ABNORMAL HIGH (ref 6–29)
AST: 31 U/L (ref 10–35)
Albumin: 3.9 g/dL (ref 3.6–5.1)
BILIRUBIN INDIRECT: 0.3 mg/dL (ref 0.2–1.2)
Bilirubin, Direct: 0.1 mg/dL (ref <=0.2)
TOTAL PROTEIN: 6.8 g/dL (ref 6.1–8.1)
Total Bilirubin: 0.4 mg/dL (ref 0.2–1.2)

## 2016-07-19 LAB — BASIC METABOLIC PANEL WITH GFR
BUN: 16 mg/dL (ref 7–25)
CHLORIDE: 102 mmol/L (ref 98–110)
CO2: 29 mmol/L (ref 20–31)
Calcium: 9.8 mg/dL (ref 8.6–10.4)
Creat: 0.78 mg/dL (ref 0.50–0.99)
GFR, EST NON AFRICAN AMERICAN: 78 mL/min (ref >=60)
GLUCOSE: 91 mg/dL (ref 65–99)
POTASSIUM: 4.3 mmol/L (ref 3.5–5.3)
Sodium: 141 mmol/L (ref 135–146)

## 2016-07-19 LAB — IRON AND TIBC
%SAT: 19 % (ref 11–50)
IRON: 65 ug/dL (ref 45–160)
TIBC: 338 ug/dL (ref 250–450)
UIBC: 273 ug/dL (ref 125–400)

## 2016-07-19 LAB — D-DIMER, QUANTITATIVE: D-Dimer, Quant: 0.37 mcg/mL FEU (ref <0.50)

## 2016-07-19 LAB — CK: Total CK: 146 U/L (ref 7–177)

## 2016-07-19 LAB — FERRITIN: FERRITIN: 108 ng/mL (ref 20–288)

## 2016-10-29 ENCOUNTER — Encounter: Payer: Self-pay | Admitting: Physician Assistant

## 2016-10-29 ENCOUNTER — Ambulatory Visit (INDEPENDENT_AMBULATORY_CARE_PROVIDER_SITE_OTHER): Payer: Medicare Other | Admitting: Physician Assistant

## 2016-10-29 VITALS — BP 142/80 | HR 75 | Temp 97.5°F | Resp 16 | Ht 63.5 in | Wt 260.0 lb

## 2016-10-29 DIAGNOSIS — K7581 Nonalcoholic steatohepatitis (NASH): Secondary | ICD-10-CM

## 2016-10-29 DIAGNOSIS — I10 Essential (primary) hypertension: Secondary | ICD-10-CM | POA: Diagnosis not present

## 2016-10-29 DIAGNOSIS — E785 Hyperlipidemia, unspecified: Secondary | ICD-10-CM | POA: Diagnosis not present

## 2016-10-29 DIAGNOSIS — E559 Vitamin D deficiency, unspecified: Secondary | ICD-10-CM | POA: Diagnosis not present

## 2016-10-29 DIAGNOSIS — Z79899 Other long term (current) drug therapy: Secondary | ICD-10-CM | POA: Diagnosis not present

## 2016-10-29 DIAGNOSIS — R05 Cough: Secondary | ICD-10-CM | POA: Diagnosis not present

## 2016-10-29 DIAGNOSIS — R7303 Prediabetes: Secondary | ICD-10-CM | POA: Diagnosis not present

## 2016-10-29 DIAGNOSIS — R059 Cough, unspecified: Secondary | ICD-10-CM

## 2016-10-29 DIAGNOSIS — R0989 Other specified symptoms and signs involving the circulatory and respiratory systems: Secondary | ICD-10-CM

## 2016-10-29 MED ORDER — AZELASTINE HCL 0.1 % NA SOLN: 2.0000 | Freq: Two times a day (BID) | NASAL | 2 refills | Status: DC

## 2016-10-29 MED ORDER — AZITHROMYCIN 250 MG PO TABS: ORAL_TABLET | ORAL | 1 refills | Status: AC

## 2016-10-29 MED ORDER — PROMETHAZINE-CODEINE 6.25-10 MG/5ML PO SYRP: 5.0000 mL | ORAL_SOLUTION | Freq: Four times a day (QID) | ORAL | 0 refills | Status: DC | PRN

## 2016-10-29 NOTE — Patient Instructions (Signed)

## 2016-10-29 NOTE — Progress Notes (Signed)
3 month follow up  Assessment:   Labile hypertension - continue medications, DASH diet, exercise and monitor at home. Call if greater than 130/80.  - CBC with Differential/Platelet - BASIC METABOLIC PANEL WITH GFR - Hepatic function panel - TSH  Prediabetes Discussed general issues about diabetes pathophysiology and management., Educational material distributed., Suggested low cholesterol diet., Encouraged aerobic exercise., Discussed foot care., Reminded to get yearly retinal exam. - Hemoglobin A1c - Insulin, fasting  Hyperlipidemia -continue medications, check lipids, decrease fatty foods, increase activity.  - Lipid panel  Morbid obesity Obesity with co morbidities- long discussion about weight loss, diet, and exercise  Medication management - Magnesium  NASH (nonalcoholic steatohepatitis) Check labs, avoid tylenol, alcohol, weight loss advised.   Cough -     azithromycin (ZITHROMAX) 250 MG tablet; Take 2 tablets (500 mg) on  Day 1,  followed by 1 tablet (250 mg) once daily on Days 2 through 5. -     promethazine-codeine (PHENERGAN WITH CODEINE) 6.25-10 MG/5ML syrup; Take 5 mLs by mouth every 6 (six) hours as needed for cough. Max: 26m per day -     azelastine (ASTELIN) 0.1 % nasal spray; Place 2 sprays into both nostrils 2 (two) times daily. Use in each nostril as directed    Subjective:  Anita MESTAis a 70y.o. AAF presents for 3 month follow up and cold symptoms. Dr. MMelford Aasewas present during the exam and agrees with the findings.   Cough x 1 week, could not sleep at night, OTC meds not helping, better if sitting up. Having stress incontinence with it, weight is unchanged. Has been using severe mucinex cough and cold, no fever, chills, no SOB/CP, states he is feeling better today.    Her blood pressure has been controlled at home, today their BP is BP: (!) 142/80 She does workout, works out in her garden and occ walks. She denies chest pain, SOB, dizziness. She is  on cholesterol medication, lshe has been off the livalo.  Her cholesterol is at goal. The cholesterol last visit was:   Lab Results  Component Value Date   CHOL 185 05/09/2016   HDL 66 05/09/2016   LDLCALC 92 05/09/2016   TRIG 136 05/09/2016   CHOLHDL 2.8 05/09/2016    She has been working on diet and exercise for prediabetes, and denies paresthesia of the feet, polydipsia, polyuria and visual disturbances. Last A1C in the office was:  Lab Results  Component Value Date   HGBA1C 6.2 (H) 05/09/2016   Patient is on Vitamin D supplement.   Lab Results  Component Value Date   VD25OH 63 05/09/2016     BMI is Body mass index is 45.33 kg/m., she is working on diet and exercise. She is tried. 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  She does have a history of NASH.  Wt Readings from Last 3 Encounters:  10/29/16 260 lb (117.9 kg)  07/18/16 261 lb 9.6 oz (118.7 kg)  05/11/16 255 lb (115.7 kg)    Medication Review: Current Outpatient Prescriptions on File Prior to Visit  Medication Sig Dispense Refill  . albuterol (VENTOLIN HFA) 108 (90 Base) MCG/ACT inhaler Inhale 2 puffs into the lungs every 4 (four) hours as needed for wheezing or shortness of breath. 1 Inhaler 0  . aspirin 81 MG tablet Take 81 mg by mouth daily.    . Cholecalciferol (VITAMIN D PO) Take 5,000 Int'l Units by mouth daily.    . magnesium 30 MG tablet Take 30  mg by mouth daily.     No current facility-administered medications on file prior to visit.     Current Problems (verified) Patient Active Problem List   Diagnosis Date Noted  . Dyspnea 05/11/2016  . Medicare annual wellness visit, subsequent 09/07/2015  . Medication management 09/07/2015  . NASH (nonalcoholic steatohepatitis) 05/10/2015  . Vitamin D deficiency 01/11/2014  . Labile hypertension   . Hyperlipidemia   . GERD (gastroesophageal reflux disease)   . Prediabetes   . Morbid obesity (BMI 42.79) 10/07/2013  . S/P left THA, AA 10/06/2013  . Cataract, nuclear  10/30/2012  . Macular hole 10/25/2011   Review of Systems  Constitutional: Negative.   HENT: Positive for congestion.   Eyes: Negative.   Respiratory: Positive for cough. Negative for hemoptysis, sputum production, shortness of breath and wheezing.   Cardiovascular: Negative.   Gastrointestinal: Negative.   Genitourinary: Negative.   Musculoskeletal: Negative.   Skin: Negative.   Neurological: Negative.   Endo/Heme/Allergies: Negative.   Psychiatric/Behavioral: Negative.       Objective:     Blood pressure (!) 142/80, pulse 75, temperature 97.5 F (36.4 C), resp. rate 16, height 5' 3.5" (1.613 m), weight 260 lb (117.9 kg), SpO2 96 %. Body mass index is 45.33 kg/m.  General appearance: alert, no distress, WD/WN, female HEENT: normocephalic, sclerae anicteric, TMs pearly, nares patent, no discharge or erythema, pharynx normal Oral cavity: MMM, no lesions Neck: supple, no lymphadenopathy, no thyromegaly, no masses Heart: RRR, normal S1, S2, no murmurs Lungs: CTA bilaterally, no wheezes, rhonchi, or rales Abdomen: +bs, soft, obese, non tender, non distended, no masses, no hepatomegaly, no splenomegaly Musculoskeletal: nontender, no swelling, no obvious deformity Extremities: no edema, no cyanosis, no clubbing Pulses: 2+ symmetric, upper and lower extremities, normal cap refill Neurological: alert, oriented x 3, CN2-12 intact, strength normal upper extremities and lower extremities, sensation normal throughout, DTRs 2+ throughout, no cerebellar signs, gait normal Psychiatric: normal affect, behavior normal, pleasant  Skin: no abnormal nevus, rashes, or lesions.     Anita Mutters, PA-C   10/29/2016

## 2016-10-30 ENCOUNTER — Other Ambulatory Visit: Payer: Self-pay | Admitting: Physician Assistant

## 2016-10-30 LAB — CBC WITH DIFFERENTIAL/PLATELET
BASOS PCT: 0 %
Basophils Absolute: 0 cells/uL (ref 0–200)
EOS PCT: 6 %
Eosinophils Absolute: 612 cells/uL — ABNORMAL HIGH (ref 15–500)
HEMATOCRIT: 38.5 % (ref 35.0–45.0)
Hemoglobin: 12.1 g/dL (ref 11.7–15.5)
LYMPHS PCT: 32 %
Lymphs Abs: 3264 cells/uL (ref 850–3900)
MCH: 25.1 pg — ABNORMAL LOW (ref 27.0–33.0)
MCHC: 31.4 g/dL — AB (ref 32.0–36.0)
MCV: 79.9 fL — ABNORMAL LOW (ref 80.0–100.0)
MONOS PCT: 9 %
MPV: 10.4 fL (ref 7.5–12.5)
Monocytes Absolute: 918 cells/uL (ref 200–950)
NEUTROS PCT: 53 %
Neutro Abs: 5406 cells/uL (ref 1500–7800)
PLATELETS: 335 10*3/uL (ref 140–400)
RBC: 4.82 MIL/uL (ref 3.80–5.10)
RDW: 15.3 % — AB (ref 11.0–15.0)
WBC: 10.2 10*3/uL (ref 3.8–10.8)

## 2016-10-30 LAB — HEPATIC FUNCTION PANEL
ALBUMIN: 4.1 g/dL (ref 3.6–5.1)
ALK PHOS: 68 U/L (ref 33–130)
ALT: 40 U/L — ABNORMAL HIGH (ref 6–29)
AST: 28 U/L (ref 10–35)
BILIRUBIN INDIRECT: 0.3 mg/dL (ref 0.2–1.2)
BILIRUBIN TOTAL: 0.4 mg/dL (ref 0.2–1.2)
Bilirubin, Direct: 0.1 mg/dL (ref <=0.2)
TOTAL PROTEIN: 7.1 g/dL (ref 6.1–8.1)

## 2016-10-30 LAB — LIPID PANEL
CHOLESTEROL: 267 mg/dL — AB (ref <200)
HDL: 50 mg/dL — AB (ref >50)
LDL Cholesterol: 171 mg/dL — ABNORMAL HIGH (ref <100)
TRIGLYCERIDES: 228 mg/dL — AB (ref <150)
Total CHOL/HDL Ratio: 5.3 Ratio — ABNORMAL HIGH (ref <5.0)
VLDL: 46 mg/dL — AB (ref <30)

## 2016-10-30 LAB — BASIC METABOLIC PANEL WITH GFR
BUN: 13 mg/dL (ref 7–25)
CALCIUM: 9.5 mg/dL (ref 8.6–10.4)
CO2: 28 mmol/L (ref 20–31)
Chloride: 101 mmol/L (ref 98–110)
Creat: 0.68 mg/dL (ref 0.50–0.99)
GLUCOSE: 85 mg/dL (ref 65–99)
POTASSIUM: 4.3 mmol/L (ref 3.5–5.3)
SODIUM: 140 mmol/L (ref 135–146)

## 2016-10-30 LAB — HEMOGLOBIN A1C
Hgb A1c MFr Bld: 5.9 % — ABNORMAL HIGH (ref <5.7)
Mean Plasma Glucose: 123 mg/dL

## 2016-10-30 LAB — TSH: TSH: 1.95 mIU/L

## 2016-10-30 LAB — MAGNESIUM: MAGNESIUM: 1.9 mg/dL (ref 1.5–2.5)

## 2016-10-30 MED ORDER — PITAVASTATIN CALCIUM 4 MG PO TABS: 1.0000 | ORAL_TABLET | Freq: Every day | ORAL | 5 refills | Status: DC

## 2016-11-14 ENCOUNTER — Ambulatory Visit: Payer: Self-pay | Admitting: Internal Medicine

## 2016-11-17 ENCOUNTER — Encounter: Payer: Self-pay | Admitting: *Deleted

## 2016-12-21 ENCOUNTER — Encounter: Payer: Self-pay | Admitting: *Deleted

## 2017-02-05 ENCOUNTER — Encounter: Payer: Self-pay | Admitting: Physician Assistant

## 2017-02-05 ENCOUNTER — Ambulatory Visit (INDEPENDENT_AMBULATORY_CARE_PROVIDER_SITE_OTHER): Payer: Medicare Other | Admitting: Physician Assistant

## 2017-02-05 VITALS — BP 126/80 | HR 74 | Temp 97.5°F | Resp 16 | Ht 63.5 in | Wt 256.4 lb

## 2017-02-05 DIAGNOSIS — I10 Essential (primary) hypertension: Secondary | ICD-10-CM

## 2017-02-05 DIAGNOSIS — Z0001 Encounter for general adult medical examination with abnormal findings: Secondary | ICD-10-CM | POA: Diagnosis not present

## 2017-02-05 DIAGNOSIS — Z0000 Encounter for general adult medical examination without abnormal findings: Secondary | ICD-10-CM

## 2017-02-05 DIAGNOSIS — H35349 Macular cyst, hole, or pseudohole, unspecified eye: Secondary | ICD-10-CM

## 2017-02-05 DIAGNOSIS — R6889 Other general symptoms and signs: Secondary | ICD-10-CM | POA: Diagnosis not present

## 2017-02-05 DIAGNOSIS — Z79899 Other long term (current) drug therapy: Secondary | ICD-10-CM

## 2017-02-05 DIAGNOSIS — E785 Hyperlipidemia, unspecified: Secondary | ICD-10-CM

## 2017-02-05 DIAGNOSIS — H2510 Age-related nuclear cataract, unspecified eye: Secondary | ICD-10-CM

## 2017-02-05 DIAGNOSIS — R7303 Prediabetes: Secondary | ICD-10-CM

## 2017-02-05 DIAGNOSIS — R0989 Other specified symptoms and signs involving the circulatory and respiratory systems: Secondary | ICD-10-CM

## 2017-02-05 DIAGNOSIS — K7581 Nonalcoholic steatohepatitis (NASH): Secondary | ICD-10-CM

## 2017-02-05 DIAGNOSIS — K219 Gastro-esophageal reflux disease without esophagitis: Secondary | ICD-10-CM

## 2017-02-05 DIAGNOSIS — IMO0002 Reserved for concepts with insufficient information to code with codable children: Secondary | ICD-10-CM

## 2017-02-05 DIAGNOSIS — E559 Vitamin D deficiency, unspecified: Secondary | ICD-10-CM

## 2017-02-05 LAB — CBC WITH DIFFERENTIAL/PLATELET
Basophils Absolute: 0 cells/uL (ref 0–200)
Basophils Relative: 0 %
EOS PCT: 2 %
Eosinophils Absolute: 150 cells/uL (ref 15–500)
HCT: 38.6 % (ref 35.0–45.0)
HEMOGLOBIN: 12.2 g/dL (ref 11.7–15.5)
LYMPHS ABS: 2475 {cells}/uL (ref 850–3900)
Lymphocytes Relative: 33 %
MCH: 25.6 pg — ABNORMAL LOW (ref 27.0–33.0)
MCHC: 31.6 g/dL — ABNORMAL LOW (ref 32.0–36.0)
MCV: 80.9 fL (ref 80.0–100.0)
MONOS PCT: 6 %
MPV: 9.9 fL (ref 7.5–12.5)
Monocytes Absolute: 450 cells/uL (ref 200–950)
NEUTROS ABS: 4425 {cells}/uL (ref 1500–7800)
Neutrophils Relative %: 59 %
PLATELETS: 313 10*3/uL (ref 140–400)
RBC: 4.77 MIL/uL (ref 3.80–5.10)
RDW: 15.4 % — ABNORMAL HIGH (ref 11.0–15.0)
WBC: 7.5 10*3/uL (ref 3.8–10.8)

## 2017-02-05 NOTE — Progress Notes (Signed)
MEDICARE ANNUAL WELLNESS VISIT AND 3 month follow up  Assessment:   Labile hypertension - continue medications, DASH diet, exercise and monitor at home. Call if greater than 130/80.  - CBC with Differential/Platelet - BASIC METABOLIC PANEL WITH GFR - Hepatic function panel - TSH   Prediabetes Discussed general issues about diabetes pathophysiology and management., Educational material distributed., Suggested low cholesterol diet., Encouraged aerobic exercise., Discussed foot care., Reminded to get yearly retinal exam. - Hemoglobin A1c - Insulin, fasting   Hyperlipidemia -continue medications, check lipids, decrease fatty foods, increase activity.  - Lipid panel   Morbid obesity Obesity with co morbidities- long discussion about weight loss, diet, and exercise   Vitamin D deficiency - Vit D  25 hydroxy (rtn osteoporosis monitoring)  Gastroesophageal reflux disease with esophagitis Continue PPI/H2 blocker, diet discussed   Medication management - Magnesium  NASH (nonalcoholic steatohepatitis) Check labs, avoid tylenol, alcohol, weight loss advised.   Future Appointments Date Time Provider [place]  05/16/2017 10:00 AM [name], PA-C [facility] None     Plan:   During the course of the visit the patient was educated and counseled about appropriate screening and preventive services including:    Pneumococcal vaccine   Influenza vaccine  Td vaccine  Screening electrocardiogram  Bone densitometry screening  Colorectal cancer screening  Diabetes screening  Glaucoma screening  Nutrition counseling   Advanced directives: requested.    Subjective:  Anita Blake is a AA 71 y.o. female who presents for Medicare Annual Wellness Visit and 3 month follow up for HTN, chol, obesity.   Her blood pressure has been controlled at home, today their BP is BP: 126/80 She does workout, walking 3 x a week. She denies chest pain, shortness of breath,  dizziness.  She is on cholesterol medication, livalo due to intolerance, 1/2 tablet nightly, better and denies myalgias. Her cholesterol is at goal. The cholesterol last visit was:   Lab Results  Component Value Date   CHOL 267 (H) 10/29/2016   HDL 50 (L) 10/29/2016   LDLCALC 171 (H) 10/29/2016   TRIG 228 (H) 10/29/2016   CHOLHDL 5.3 (H) 10/29/2016    She has been working on diet and exercise for prediabetes, and denies paresthesia of the feet, polydipsia, polyuria and visual disturbances. Last A1C in the office was:  Lab Results  Component Value Date   HGBA1C 5.9 (H) 10/29/2016   Patient is on Vitamin D supplement.   Lab Results  Component Value Date   VD25OH 63 05/09/2016     BMI is Body mass index is 44.71 kg/m., she is working on diet and exercise. Husband has been sick and this has been stressing her out, he can no longer drive due to his sight. Has retired. She does have a history of NASH.  Wt Readings from Last 3 Encounters:  02/05/17 256 lb 6.4 oz (116.3 kg)  10/29/16 260 lb (117.9 kg)  07/18/16 261 lb 9.6 oz (118.7 kg)   Occ left medial knee pain but intermittent.   Medication Review: Current Outpatient Prescriptions on File Prior to Visit  Medication Sig Dispense Refill  . aspirin 81 MG tablet Take 81 mg by mouth daily.    . Cholecalciferol (VITAMIN D PO) Take 5,000 Int'l Units by mouth daily.    . Pitavastatin Calcium (LIVALO) 4 MG TABS Take 1 tablet (4 mg total) by mouth at bedtime. 30 tablet 5   No current facility-administered medications on file prior to visit.     Current  Problems (verified) Patient Active Problem List   Diagnosis Date Noted  . Medicare annual wellness visit, subsequent 09/07/2015  . Medication management 09/07/2015  . NASH (nonalcoholic steatohepatitis) 05/10/2015  . Vitamin D deficiency 01/11/2014  . Labile hypertension   . Hyperlipidemia   . GERD (gastroesophageal reflux disease)   . Prediabetes   . Morbid obesity (BMI 42.79)  10/07/2013  . Cataract, nuclear 10/30/2012  . Macular hole 10/25/2011    Screening Tests Immunization History  Administered Date(s) Administered  . DT 05/10/2015  . Td 07/27/2005    Preventative care: Last colonoscopy: 2011 [facility]) , had negative cologuard 05/2015 Last mammogram: 05/2016 Last pap smear/pelvic exam: 03/2014 does not need another DEXA:2002 Neg CT head 09/2013 [place] AB 2012 US Soft tissues 2011 Stress test 2017 CXR 04/2016  Prior vaccinations: TD or Tdap: 2016 Influenza: declines Pneumococcal: declines Prevnar13: declines Shingles/Zostavax: declines  Names of Other Physician/Practitioners you currently use: 1. [facility] here for primary care 2. Dr. Kathrin Penner, eye doctor,april 2017 3. Dr. Randol Kern, dentist, has Ov next week Patient Care Team: Unk Pinto, MD as PCP - General (Internal Medicine) Richmond Campbell, MD as Consulting Physician (Gastroenterology) Delila Pereyra, MD as Consulting Physician (Gynecology) Paralee Cancel, MD as Consulting Physician (Orthopedic Surgery)  Allergies Allergies  Allergen Reactions  . Anesthetics, Halogenated     Difficulty with waking up from anesthesia from [facility], had aphasia, negative CT  . Crestor [Rosuvastatin]   . Lopid [Gemfibrozil]   . Latex Rash and Swelling    [Rash] redness/swelling/rash    SURGICAL HISTORY She  has a past surgical history that includes Abdominal hysterectomy; Eye surgery (Left, 2007); cataract surgery (Left); right arm elbow surgery; index finger surgery (Right); and Total hip arthroplasty (Left, 10/06/2013). FAMILY HISTORY Her family history includes Alzheimer's disease in her other; Heart attack (age of onset: 31) in her sister; Heart attack (age of onset: 36) in her brother; Heart disease in her mother; Stroke in her father. SOCIAL HISTORY She  reports that she has never smoked. She has never used smokeless tobacco. She reports that she does not  drink alcohol or use drugs.  MEDICARE WELLNESS OBJECTIVES: Physical activity: Current Exercise Habits: Home exercise routine, Type of exercise: walking, Time (Minutes): 30, Frequency (Times/Week): 3, Weekly Exercise (Minutes/Week): 90, Intensity: Mild Cardiac risk factors: Cardiac Risk Factors include: advanced age (>14mn, >>53women);dyslipidemia;hypertension;obesity (BMI >30kg/m2);sedentary lifestyle Depression/mood screen:   Depression screen [facility]2/9 02/05/2017  Decreased Interest 0  Down, Depressed, Hopeless 0  PHQ - 2 Score 0    ADLs:  In your present state of health, do you have any difficulty performing the following activities: 02/05/2017  Hearing? N  Vision? N  Difficulty concentrating or making decisions? N  Walking or climbing stairs? N  Dressing or bathing? N  Doing errands, shopping? N  Some recent data might be hidden     Cognitive Testing  Alert? Yes  Normal Appearance?Yes  Oriented to person? Yes  Place? Yes   Time? Yes  Recall of three objects?  Yes  Can perform simple calculations? Yes  Displays appropriate judgment?Yes  Can read the correct time from a watch face?Yes  EOL planning: Does Patient Have a Medical Advance Directive?: Yes Type of Advance Directive: Healthcare Power of Attorney, Living will Does patient want to make changes to medical advance directive?: No - Patient declined [place] No - copy requested   Objective:     Today's Vitals  02/05/17 1043  BP: 126/80  Pulse: 74  Resp: 16  Temp: 97.5 F (36.4 C)  SpO2: 98%  Weight: 256 lb 6.4 oz (116.3 kg)  Height: 5' 3.5" (1.613 m)   Body mass index is 44.71 kg/m.   General appearance: alert, no distress, WD/WN, female HEENT: normocephalic, sclerae anicteric, TMs pearly, nares patent, no discharge or erythema, pharynx normal Oral cavity: MMM, no lesions Neck: supple, no lymphadenopathy, no thyromegaly, no masses Heart: RRR, normal S1, S2, no  murmurs Lungs: CTA bilaterally, no wheezes, rhonchi, or rales Abdomen: +bs, soft, obese, non tender, non distended, no masses, no hepatomegaly, no splenomegaly Musculoskeletal: nontender, no swelling, no obvious deformity Extremities: no edema, no cyanosis, no clubbing Pulses: 2+ symmetric, upper and lower extremities, normal cap refill Neurological: alert, oriented x 3, CN2-12 intact, strength normal upper extremities and lower extremities, sensation normal throughout, DTRs 2+ throughout, no cerebellar signs, gait normal Psychiatric: normal affect, behavior normal, pleasant    Medicare Attestation I have personally reviewed: The patient's medical and social history Their use of alcohol, tobacco or illicit drugs Their current medications and supplements The patient's functional ability including ADLs,fall risks, home safety risks, cognitive, and hearing and visual impairment Diet and physical activities Evidence for depression or mood disorders  The patient's weight, height, BMI, and visual acuity have been recorded in the chart.  I have made referrals, counseling, and provided education to the patient based on review of the above and I have provided the patient with a written personalized care plan for preventive services.     [name], PA-C   02/05/2017

## 2017-02-05 NOTE — Patient Instructions (Addendum)
Make sure you are drinking 80-100 oz a day of water  Try the melatonin 29m-20mg dissolvable or gummy 30 mins before bed    Simple math prevails.    1st - exercise does not produce significant weight loss - at best one converts fat into muscle , "bulks up", loses inches, but usually stays "weight neutral"     2nd - think of your body weightas a check book: If you eat more calories than you burn up - you save money or gain weight .... Or if you spend more money than you put in the check book, ie burn up more calories than you eat, then you lose weight     3rd - if you walk or run 1 mile, you burn up 100 calories - you have to burn up 3,500 calories to lose 1 pound, ie you have to walk/run 35 miles to lose 1 measly pound. So if you want to lose 10 #, then you have to walk/run 350 miles, so.... clearly exercise is not the solution.     4. So if you consume 1,500 calories, then you have to burn up the equivalent of 15 miles to stay weight neutral - It also stands to reason that if you consume 1,500 cal/day and don't lose weight, then you must be burning up about 1,500 cals/day to stay weight neutral.     5. If you really want to lose weight, you must cut your calorie intake 300 calories /day and at that rate you should lose about 1 # every 3 days.   6. Please purchase Dr JFara OldenFuhrman's book(s) "The End of Dieting" & "Eat to Live" . It has some great concepts and recipes.

## 2017-02-06 LAB — HEPATIC FUNCTION PANEL
ALT: 32 U/L — AB (ref 6–29)
AST: 25 U/L (ref 10–35)
Albumin: 4.1 g/dL (ref 3.6–5.1)
Alkaline Phosphatase: 63 U/L (ref 33–130)
BILIRUBIN DIRECT: 0.1 mg/dL (ref <=0.2)
BILIRUBIN INDIRECT: 0.5 mg/dL (ref 0.2–1.2)
BILIRUBIN TOTAL: 0.6 mg/dL (ref 0.2–1.2)
Total Protein: 7.2 g/dL (ref 6.1–8.1)

## 2017-02-06 LAB — BASIC METABOLIC PANEL WITH GFR
BUN: 13 mg/dL (ref 7–25)
CALCIUM: 9.6 mg/dL (ref 8.6–10.4)
CO2: 29 mmol/L (ref 20–31)
Chloride: 102 mmol/L (ref 98–110)
Creat: 0.7 mg/dL (ref 0.50–0.99)
GFR, EST NON AFRICAN AMERICAN: 89 mL/min (ref >=60)
GFR, Est African American: >89 mL/min (ref >=60)
Glucose, Bld: 95 mg/dL (ref 65–99)
Potassium: 4.2 mmol/L (ref 3.5–5.3)
SODIUM: 141 mmol/L (ref 135–146)

## 2017-02-06 LAB — LIPID PANEL
CHOL/HDL RATIO: 3.2 ratio (ref <5.0)
Cholesterol: 182 mg/dL (ref <200)
HDL: 57 mg/dL (ref >50)
LDL Cholesterol: 101 mg/dL — ABNORMAL HIGH (ref <100)
TRIGLYCERIDES: 118 mg/dL (ref <150)
VLDL: 24 mg/dL (ref <30)

## 2017-02-06 LAB — TSH: TSH: 1.75 m[IU]/L

## 2017-02-06 LAB — MAGNESIUM: Magnesium: 1.7 mg/dL (ref 1.5–2.5)

## 2017-02-06 NOTE — Progress Notes (Signed)
LVM w/ pt's husband for pt to return office call for LAB results.

## 2017-04-16 ENCOUNTER — Encounter: Payer: Self-pay | Admitting: Internal Medicine

## 2017-04-19 ENCOUNTER — Other Ambulatory Visit: Payer: Self-pay | Admitting: Internal Medicine

## 2017-04-19 DIAGNOSIS — Z1231 Encounter for screening mammogram for malignant neoplasm of breast: Secondary | ICD-10-CM
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
## 2017-05-16 ENCOUNTER — Encounter: Payer: Self-pay | Admitting: Physician Assistant

## 2017-05-16 ENCOUNTER — Ambulatory Visit (INDEPENDENT_AMBULATORY_CARE_PROVIDER_SITE_OTHER): Payer: Medicare Other | Admitting: Physician Assistant

## 2017-05-16 VITALS — BP 124/84 | HR 76 | Temp 97.7°F | Resp 16 | Ht 63.5 in | Wt 256.4 lb

## 2017-05-16 DIAGNOSIS — I10 Essential (primary) hypertension: Secondary | ICD-10-CM

## 2017-05-16 DIAGNOSIS — D649 Anemia, unspecified: Secondary | ICD-10-CM

## 2017-05-16 DIAGNOSIS — Z0001 Encounter for general adult medical examination with abnormal findings: Secondary | ICD-10-CM

## 2017-05-16 DIAGNOSIS — Z0000 Encounter for general adult medical examination without abnormal findings: Secondary | ICD-10-CM

## 2017-05-16 DIAGNOSIS — Z136 Encounter for screening for cardiovascular disorders: Secondary | ICD-10-CM

## 2017-05-16 DIAGNOSIS — Z79899 Other long term (current) drug therapy: Secondary | ICD-10-CM

## 2017-05-16 DIAGNOSIS — E559 Vitamin D deficiency, unspecified: Secondary | ICD-10-CM

## 2017-05-16 DIAGNOSIS — K7581 Nonalcoholic steatohepatitis (NASH): Secondary | ICD-10-CM

## 2017-05-16 DIAGNOSIS — R0989 Other specified symptoms and signs involving the circulatory and respiratory systems: Secondary | ICD-10-CM

## 2017-05-16 DIAGNOSIS — R7303 Prediabetes: Secondary | ICD-10-CM

## 2017-05-16 DIAGNOSIS — E785 Hyperlipidemia, unspecified: Secondary | ICD-10-CM

## 2017-05-16 DIAGNOSIS — K219 Gastro-esophageal reflux disease without esophagitis: Secondary | ICD-10-CM

## 2017-05-16 LAB — CBC WITH DIFFERENTIAL/PLATELET
Basophils Absolute: 0 cells/uL (ref 0–200)
Basophils Relative: 0 %
Eosinophils Absolute: 150 cells/uL (ref 15–500)
Eosinophils Relative: 2 %
HCT: 38.2 % (ref 35.0–45.0)
Hemoglobin: 12.2 g/dL (ref 11.7–15.5)
Lymphocytes Relative: 33 %
Lymphs Abs: 2475 cells/uL (ref 850–3900)
MCH: 25.6 pg — ABNORMAL LOW (ref 27.0–33.0)
MCHC: 31.9 g/dL — ABNORMAL LOW (ref 32.0–36.0)
MCV: 80.1 fL (ref 80.0–100.0)
MPV: 9.7 fL (ref 7.5–12.5)
Monocytes Absolute: 525 cells/uL (ref 200–950)
Monocytes Relative: 7 %
Neutro Abs: 4350 cells/uL (ref 1500–7800)
Neutrophils Relative %: 58 %
Platelets: 304 10*3/uL (ref 140–400)
RBC: 4.77 MIL/uL (ref 3.80–5.10)
RDW: 15.1 % — ABNORMAL HIGH (ref 11.0–15.0)
WBC: 7.5 10*3/uL (ref 3.8–10.8)

## 2017-05-16 LAB — BASIC METABOLIC PANEL WITH GFR
BUN: 15 mg/dL (ref 7–25)
CO2: 28 mmol/L (ref 20–31)
CREATININE: 0.8 mg/dL (ref 0.50–0.99)
Calcium: 9.3 mg/dL (ref 8.6–10.4)
Chloride: 101 mmol/L (ref 98–110)
GFR, EST AFRICAN AMERICAN: 88 mL/min (ref >=60)
GFR, Est Non African American: 76 mL/min (ref >=60)
Glucose, Bld: 90 mg/dL (ref 65–99)
Potassium: 4.2 mmol/L (ref 3.5–5.3)
Sodium: 140 mmol/L (ref 135–146)

## 2017-05-16 LAB — LIPID PANEL
CHOLESTEROL: 183 mg/dL (ref <200)
HDL: 58 mg/dL (ref >50)
LDL Cholesterol: 103 mg/dL — ABNORMAL HIGH (ref <100)
TRIGLYCERIDES: 109 mg/dL (ref <150)
Total CHOL/HDL Ratio: 3.2 Ratio (ref <5.0)
VLDL: 22 mg/dL (ref <30)

## 2017-05-16 LAB — HEPATIC FUNCTION PANEL
ALT: 31 U/L — ABNORMAL HIGH (ref 6–29)
AST: 30 U/L (ref 10–35)
Albumin: 4 g/dL (ref 3.6–5.1)
Alkaline Phosphatase: 62 U/L (ref 33–130)
Bilirubin, Direct: 0.1 mg/dL (ref <=0.2)
Indirect Bilirubin: 0.5 mg/dL (ref 0.2–1.2)
Total Bilirubin: 0.6 mg/dL (ref 0.2–1.2)
Total Protein: 7.1 g/dL (ref 6.1–8.1)

## 2017-05-16 LAB — TSH: TSH: 1.92 mIU/L

## 2017-05-16 LAB — IRON AND TIBC
%SAT: 20 % (ref 11–50)
IRON: 66 ug/dL (ref 45–160)
TIBC: 336 ug/dL (ref 250–450)
UIBC: 270 ug/dL

## 2017-05-16 LAB — VITAMIN D 25 HYDROXY (VIT D DEFICIENCY, FRACTURES): Vit D, 25-Hydroxy: 52 ng/mL (ref 30–100)

## 2017-05-16 LAB — MAGNESIUM: Magnesium: 2 mg/dL (ref 1.5–2.5)

## 2017-05-16 NOTE — Patient Instructions (Signed)
Common causes of cough OR hoarseness OR sore throat:   Allergies, Viral Infections, Acid Reflux and Bacterial Infections.  1) Allergies and viral infections cause a cough OR sore throat by post nasal drip and are often worse at night, can also have sneezing, lower grade fevers, clear/yellow mucus. This is best treated with allergy medications or nasal sprays.  Please get on allegra for 1-2 weeks The strongest is allegra or fexafinadine  Cheapest at [employer], [employer], [employer]  2) Bacterial infections are more severe than allergies or viral infections with fever, teeth pain, fatigue. This can be treated with prednisone and the same over the counter medication and after 7 days can be treated with an antibiotic.   3) Silent reflux/GERD can cause a cough OR sore throat OR hoarseness WITHOUT heart burn because the esophagus that goes to the stomach and trachea that goes to the lungs are very close and when you lay down the acid can irritate your throat and lungs. This can cause hoarseness, cough, and wheezing. Please stop any alcohol or anti-inflammatories like aleve/advil/ibuprofen and start an over the counter Prilosec or omeprazole 1-2 times daily 38mns before food for 2 weeks, then switch to over the counter zantac/ratinidine or pepcid/famotadine once at night for 2 weeks.   4) sometimes irritation causes more irritation. Try voice rest, use sugar free cough drops to prevent coughing, and try to stop clearing your throat.   If you ever have a cough that does not go away after trying these things please make a follow up visit for further evaluation or we can refer you to a specialist. Or if you ever have shortness of breath or chest pain go to the ER.     Simple math prevails.    1st - exercise does not produce significant weight loss - at best one converts fat into muscle , "bulks up", loses inches, but usually stays "weight neutral"     2nd - think of your body weightas a check book: If you eat  more calories than you burn up - you save money or gain weight .... Or if you spend more money than you put in the check book, ie burn up more calories than you eat, then you lose weight     3rd - if you walk or run 1 mile, you burn up 100 calories - you have to burn up 3,500 calories to lose 1 pound, ie you have to walk/run 35 miles to lose 1 measly pound. So if you want to lose 10 #, then you have to walk/run 350 miles, so.... clearly exercise is not the solution.     4. So if you consume 1,500 calories, then you have to burn up the equivalent of 15 miles to stay weight neutral - It also stands to reason that if you consume 1,500 cal/day and don't lose weight, then you must be burning up about 1,500 cals/day to stay weight neutral.     5. If you really want to lose weight, you must cut your calorie intake 300 calories /day and at that rate you should lose about 1 # every 3 days.   6. Please purchase Dr JFara OldenFuhrman's book(s) "The End of Dieting" & "Eat to Live" . It has some great concepts and recipes.

## 2017-05-17 LAB — URINALYSIS, ROUTINE W REFLEX MICROSCOPIC
Bilirubin Urine: NEGATIVE
Glucose, UA: NEGATIVE
Hgb urine dipstick: NEGATIVE
Ketones, ur: NEGATIVE
Leukocytes, UA: NEGATIVE
NITRITE: NEGATIVE
PH: 6.5 (ref 5.0–8.0)
Protein, ur: NEGATIVE
SPECIFIC GRAVITY, URINE: 1.019 (ref 1.001–1.035)

## 2017-05-17 LAB — MICROALBUMIN / CREATININE URINE RATIO
CREATININE, URINE: 172 mg/dL (ref 20–320)
MICROALB UR: 1.8 mg/dL
Microalb Creat Ratio: 10 mcg/mg creat (ref <30)

## 2017-05-17 LAB — VITAMIN B12: Vitamin B-12: 321 pg/mL (ref 200–1100)

## 2017-05-17 LAB — SEDIMENTATION RATE: Sed Rate: 25 mm/hr (ref 0–30)

## 2017-05-17 LAB — HEMOGLOBIN A1C
Hgb A1c MFr Bld: 6.1 % — ABNORMAL HIGH (ref <5.7)
Mean Plasma Glucose: 128 mg/dL

## 2017-05-23 ENCOUNTER — Ambulatory Visit
Admission: RE | Admit: 2017-05-23 | Discharge: 2017-05-23 | Disposition: A | Discharge status: Home / Self Care | Payer: Medicare Other | Source: Ambulatory Visit | Attending: Internal Medicine | Admitting: Internal Medicine

## 2017-05-23 DIAGNOSIS — Z1231 Encounter for screening mammogram for malignant neoplasm of breast: Secondary | ICD-10-CM

## 2017-06-04 ENCOUNTER — Other Ambulatory Visit: Payer: Self-pay | Admitting: Obstetrics and Gynecology

## 2017-06-04 DIAGNOSIS — Z803 Family history of malignant neoplasm of breast: Secondary | ICD-10-CM

## 2017-06-12 ENCOUNTER — Other Ambulatory Visit: Payer: Self-pay | Admitting: Physician Assistant

## 2017-06-12 ENCOUNTER — Encounter: Payer: Self-pay | Admitting: Physician Assistant

## 2017-06-12 DIAGNOSIS — R0600 Dyspnea, unspecified: Secondary | ICD-10-CM

## 2017-06-18 ENCOUNTER — Ambulatory Visit (INDEPENDENT_AMBULATORY_CARE_PROVIDER_SITE_OTHER): Payer: Medicare Other | Admitting: Pulmonary Disease

## 2017-06-18 ENCOUNTER — Encounter: Payer: Self-pay | Admitting: Pulmonary Disease

## 2017-06-18 ENCOUNTER — Other Ambulatory Visit (INDEPENDENT_AMBULATORY_CARE_PROVIDER_SITE_OTHER): Payer: Medicare Other

## 2017-06-18 ENCOUNTER — Ambulatory Visit (INDEPENDENT_AMBULATORY_CARE_PROVIDER_SITE_OTHER)
Admission: RE | Admit: 2017-06-18 | Discharge: 2017-06-18 | Disposition: A | Discharge status: Home / Self Care | Payer: Medicare Other | Source: Ambulatory Visit | Attending: Pulmonary Disease | Admitting: Pulmonary Disease

## 2017-06-18 VITALS — BP 124/74 | HR 78 | Ht 63.5 in | Wt 258.2 lb

## 2017-06-18 DIAGNOSIS — R0602 Shortness of breath: Secondary | ICD-10-CM | POA: Diagnosis not present

## 2017-06-18 LAB — CBC WITH DIFFERENTIAL/PLATELET
BASOS ABS: 0.1 10*3/uL (ref 0.0–0.1)
Basophils Relative: 0.7 % (ref 0.0–3.0)
EOS ABS: 0.2 10*3/uL (ref 0.0–0.7)
EOS PCT: 2.3 % (ref 0.0–5.0)
HCT: 38.6 % (ref 36.0–46.0)
HEMOGLOBIN: 12.3 g/dL (ref 12.0–15.0)
Lymphocytes Relative: 35.2 % (ref 12.0–46.0)
Lymphs Abs: 2.9 10*3/uL (ref 0.7–4.0)
MCHC: 31.8 g/dL (ref 30.0–36.0)
MCV: 80.2 fl (ref 78.0–100.0)
MONO ABS: 0.6 10*3/uL (ref 0.1–1.0)
Monocytes Relative: 7.3 % (ref 3.0–12.0)
Neutro Abs: 4.5 10*3/uL (ref 1.4–7.7)
Neutrophils Relative %: 54.5 % (ref 43.0–77.0)
Platelets: 290 10*3/uL (ref 150.0–400.0)
RBC: 4.81 Mil/uL (ref 3.87–5.11)
RDW: 14.8 % (ref 11.5–15.5)
WBC: 8.3 10*3/uL (ref 4.0–10.5)

## 2017-06-18 LAB — NITRIC OXIDE: NITRIC OXIDE: 5

## 2017-06-18 NOTE — Progress Notes (Signed)
Anita Blake    [id number]    03/11/1946  Primary Care Physician:McKeown, Gwyndolyn Saxon, MD  Referring Physician: [name], PA-C [address] [place], [place] [address]  Chief complaint:   Consult for evaluation of dyspnea, cough  HPI: Anita Blake is a 71 year old with past medical history of obesity, hyperlipidemia, GERD, and Anita Blake here for evaluation of dyspnea at rest and with exertion. She has non-productive cough with wheezing and chest tightness for the past 6-8 months. She is using the inhaler at home but does not know the name. She does not have any change in symptoms with that. She has history of GERD and diagnosed with Schatzki's ring in August 2017. There are no no seasonal allergies. She was evaluated by cardiology and had exercise stress test with no evidence of ischemia.  Pets:No cats, dogs, birds Occupation: [employer] Exposures: no mold, hot tubs,  Smoking history: never smoked  Outpatient Encounter Prescriptions as of 06/18/2017  Medication Sig  . aspirin 81 MG tablet Take 81 mg by mouth daily.  . Cholecalciferol (VITAMIN D PO) Take 5,000 Int'l Units by mouth daily.  Marland Kitchen MAGNESIUM PO Take by mouth.  . Pitavastatin Calcium (LIVALO) 4 MG TABS Take 1 tablet (4 mg total) by mouth at bedtime.   No facility-administered encounter medications on file as of 06/18/2017.     Allergies as of 06/18/2017 - Review Complete 06/18/2017  Allergen Reaction Noted  . Anesthetics, halogenated  06/29/2014  . Crestor [rosuvastatin]  01/10/2014  . Lopid [gemfibrozil]  01/10/2014  . Latex Rash and Swelling 09/29/2013    Past Medical History:  Diagnosis Date  . Anemia yrs ago  . Arthritis   . Complication of anesthesia    slow to wake up  . GERD (gastroesophageal reflux disease)   . Hyperlipidemia   . Labile hypertension    no meds  . Nonalcoholic steatohepatitis (NASH)   . Prediabetes   . Vitamin D deficiency     Past Surgical History:    Procedure Laterality Date  . ABDOMINAL HYSTERECTOMY    . cataract surgery Left   . EYE SURGERY Left 2007   macular hole in retina  . index finger surgery Right   . right arm elbow surgery     ligament repair  . TOTAL HIP ARTHROPLASTY Left 10/06/2013   Procedure: LEFT TOTAL HIP ARTHROPLASTY ANTERIOR APPROACH;  Surgeon: Mauri Pole, MD;  Location: [facility];  Service: Orthopedics;  Laterality: Left;    Family History  Problem Relation Age of Onset  . Heart disease Mother        Vague  . Stroke Father   . Alzheimer's disease Other   . Heart attack Brother 47  . Heart attack Sister 38  . Breast cancer Sister   . Breast cancer Cousin     Social History   Social History  . Marital status: Married    Spouse name: N/A  . Number of children: 1  . Years of education: N/A   Occupational History  . Not on file.   Social History Main Topics  . Smoking status: Never Smoker  . Smokeless tobacco: Never Used  . Alcohol use No  . Drug use: No  . Sexual activity: Not on file   Other Topics Concern  . Not on file   Social History Narrative   Lives with husband.      Review of systems: Review of Systems  Constitutional: Negative  for fever and chills.  HENT: Negative.   Eyes: Negative for blurred vision.  Respiratory: as per HPI  Cardiovascular: Negative for chest pain and palpitations.  Gastrointestinal: Negative for vomiting, diarrhea, blood per rectum. Genitourinary: Negative for dysuria, urgency, frequency and hematuria.  Musculoskeletal: Negative for myalgias, back pain and joint pain.  Skin: Negative for itching and rash.  Neurological: Negative for dizziness, tremors, focal weakness, seizures and loss of consciousness.  Endo/Heme/Allergies: Negative for environmental allergies.  Psychiatric/Behavioral: Negative for depression, suicidal ideas and hallucinations.  All other systems reviewed and are negative.  Physical Exam: Blood pressure 124/74, pulse 78, height 5'  3.5" (1.613 m), weight 258 lb 3.2 oz (117.1 kg), SpO2 98 %. Gen:      No acute distress HEENT:  EOMI, sclera anicteric Neck:     No masses; no thyromegaly Lungs:    Clear to auscultation bilaterally; normal respiratory effort CV:         Regular rate and rhythm; no murmurs Abd:      + bowel sounds; soft, non-tender; no palpable masses, no distension Ext:    No edema; adequate peripheral perfusion Skin:      Warm and dry; no rash Neuro: alert and oriented x 3 Psych: normal mood and affect  Data Reviewed: FENO 06/18/17- < 5  Exercise stress test 05/23/16- mildly impaired exercise tolerance, no evidence of ischemia.  Chest x-ray 05/09/16-no acute cardiopulmonary abnormality. I have reviewed the images personally.  CBC    Component Value Date/Time   WBC 7.5 05/16/2017 1033   RBC 4.77 05/16/2017 1033   HGB 12.2 05/16/2017 1033   HCT 38.2 05/16/2017 1033   PLT 304 05/16/2017 1033   MCV 80.1 05/16/2017 1033   MCH 25.6 (L) 05/16/2017 1033   MCHC 31.9 (L) 05/16/2017 1033   RDW 15.1 (H) 05/16/2017 1033   LYMPHSABS 2,475 05/16/2017 1033   MONOABS 525 05/16/2017 1033   EOSABS 150 05/16/2017 1033   BASOSABS 0 05/16/2017 1033   Assessment:  Consult for cough, dyspnea, wheezing Suspect she has upper airway cough syndrome from postnasal drip, GERD. FENO is low in office. We will evaluate for asthma with chest x-ray, PFTs, CBC differential and blood allergy profile. She will continue to use the inhaler she has and will bring it in at next visit so we can identify it.   For postnasal drip I have advised her to use chlorpheniramine over-the-counter 8 mg 3 times daily and Flonase nasal spray. She'll try Zantac for GERD. She is not interested in using meds that she does not need but is willing to give this a try for a couple of weeks. We will reevaluate response at time of return visit.  Plan/Recommendations: - PFTs, CXR - CBC with diff, blood allergy profile - Continue inhaler - Use  chlorpheniramine 8 gm tid, Flonase nasal spray - Zantac for GERD.   [name] MD [facility] Pager [phone] 06/18/2017, 11:27 AM  CC: [name], PA-C

## 2017-06-18 NOTE — Patient Instructions (Signed)
We'll get a chest x-ray today, CBC differential and a platelet allergy profile Scheduled for pulmonary function test Use over-the-counter chlorpheniramine 8 mg 3 times daily and Flonase nasal spray Use Zantac over-the-counter for GERD  Return to clinic in 1-2 months.

## 2017-06-20 LAB — RESPIRATORY ALLERGY PROFILE REGION II ~~LOC~~
ALLERGEN, C. HERBARUM, M2: 0.12 kU/L — AB
ALLERGEN, D PTERNOYSSINUS, D1: 3.65 kU/L — AB
Allergen, Cedar tree, t12: 0.3 kU/L — ABNORMAL HIGH
Allergen, Comm Silver Birch, t9: <0.1 kU/L
Allergen, Cottonwood, t14: 0.31 kU/L — ABNORMAL HIGH
Allergen, Mouse Urine Protein, e78: <0.1 kU/L
Allergen, Oak,t7: 0.21 kU/L — ABNORMAL HIGH
Allergen, P. notatum, m1: <0.1 kU/L
Bermuda Grass: <0.1 kU/L
Box Elder IgE: <0.1 kU/L
COCKROACH: 3.83 kU/L — AB
Cat Dander: 0.37 kU/L — ABNORMAL HIGH
D. FARINAE: 3.78 kU/L — AB
DOG DANDER: 0.29 kU/L — AB
IgE (Immunoglobulin E), Serum: 746 kU/L — ABNORMAL HIGH (ref <115)
Pecan/Hickory Tree IgE: 0.13 kU/L — ABNORMAL HIGH
ROUGH PIGWEED IGE: 0.32 kU/L — AB
SHEEP SORREL IGE: 0.14 kU/L — AB
Timothy Grass: <0.1 kU/L

## 2017-06-21 ENCOUNTER — Telehealth: Payer: Self-pay | Admitting: Pulmonary Disease

## 2017-06-21 NOTE — Progress Notes (Signed)
Left message for patient to contact office.

## 2017-06-21 NOTE — Telephone Encounter (Signed)
[name], MD  [name], [name], CMA        Please let the patient know that the chest x ray is normal   Called and spoke with pt and she is aware of results per PM.

## 2017-07-29 ENCOUNTER — Ambulatory Visit
Admission: RE | Admit: 2017-07-29 | Discharge: 2017-07-29 | Disposition: A | Discharge status: Home / Self Care | Payer: Medicare Other | Source: Ambulatory Visit | Attending: Obstetrics and Gynecology | Admitting: Obstetrics and Gynecology

## 2017-07-29 DIAGNOSIS — Z803 Family history of malignant neoplasm of breast: Secondary | ICD-10-CM

## 2017-07-29 MED ORDER — GADOBENATE DIMEGLUMINE 529 MG/ML IV SOLN
20.0000 mL | Freq: Once | INTRAVENOUS | Status: AC | PRN
  Administered 2017-07-29: 20 mL via INTRAVENOUS

## 2017-07-30 ENCOUNTER — Other Ambulatory Visit: Payer: Self-pay | Admitting: Internal Medicine

## 2017-07-30 DIAGNOSIS — D3000 Benign neoplasm of unspecified kidney: Secondary | ICD-10-CM

## 2017-08-07 ENCOUNTER — Other Ambulatory Visit: Payer: Self-pay | Admitting: Obstetrics and Gynecology

## 2017-08-07 DIAGNOSIS — R928 Other abnormal and inconclusive findings on diagnostic imaging of breast: Secondary | ICD-10-CM

## 2017-08-09 NOTE — Progress Notes (Signed)
3 MONTH FOLLOW UP  Assessment:   Labile hypertension - continue medications, DASH diet, exercise and monitor at home. Call if greater than 130/80.  - CBC with Differential/Platelet - BASIC METABOLIC PANEL WITH GFR - Hepatic function panel - TSH  Prediabetes Discussed general issues about diabetes pathophysiology and management., Educational material distributed., Suggested low cholesterol diet., Encouraged aerobic exercise., Discussed foot care., Reminded to get yearly retinal exam. - Hemoglobin A1c - Insulin, fasting  Hyperlipidemia -continue medications, check lipids, decrease fatty foods, increase activity.  - Lipid panel  Morbid obesity Obesity with co morbidities- long discussion about weight loss, diet, and exercise  Medication management - Magnesium  NASH (nonalcoholic steatohepatitis) Check labs, avoid tylenol, alcohol, weight loss advised.   Breast MRI Will send for second opinion for possible bipsy Patient would like to wait if possible but wants second opinion  Future Appointments Date Time Provider [place]  08/14/2017 11:00 AM [facility] PFT ROOM [facility] None  08/16/2017 3:00 PM [name], MD [facility] None  11/18/2017 9:30 AM Unk Pinto, MD [facility] None  05/20/2018 10:00 AM [name], PA-C [facility] None     Subjective:  Anita Blake is a 71 y.o. AAF presents for 3 month follow up and MRI results.   She has dense breast, Cat D, had MRI breast with two very small masses, 8 mm and 6 mm, suggested BX but she wants another opinion, will refer to [facility].   Her blood pressure has been controlled at home, today their BP is BP: 130/78 She does workout, works out in her garden and occ walks. She denies chest pain, SOB, dizziness. She is on cholesterol medication, lshe has been off the livalo.  Her cholesterol is at goal. The cholesterol last visit was:   Lab Results  Component Value Date   CHOL 183 05/16/2017   HDL 58  05/16/2017   LDLCALC 103 (H) 05/16/2017   TRIG 109 05/16/2017   CHOLHDL 3.2 05/16/2017    She has been working on diet and exercise for prediabetes, and denies paresthesia of the feet, polydipsia, polyuria and visual disturbances. Last A1C in the office was:  Lab Results  Component Value Date   HGBA1C 6.1 (H) 05/16/2017   Patient is on Vitamin D supplement.   Lab Results  Component Value Date   VD25OH 52 05/16/2017     BMI is Body mass index is 44.57 kg/m., she is working on diet and exercise. She is tried. 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  She does have a history of NASH.  Wt Readings from Last 3 Encounters:  08/12/17 255 lb 9.6 oz (115.9 kg)  06/18/17 258 lb 3.2 oz (117.1 kg)  05/16/17 256 lb 6.4 oz (116.3 kg)    Medication Review: Current Outpatient Prescriptions on File Prior to Visit  Medication Sig Dispense Refill  . aspirin 81 MG tablet Take 81 mg by mouth daily.    . Cholecalciferol (VITAMIN D PO) Take 5,000 Int'l Units by mouth daily.    Marland Kitchen MAGNESIUM PO Take by mouth.    . Pitavastatin Calcium (LIVALO) 4 MG TABS Take 1 tablet (4 mg total) by mouth at bedtime. 30 tablet 5   No current facility-administered medications on file prior to visit.     Current Problems (verified) Patient Active Problem List   Diagnosis Date Noted  . Dyspnea 05/11/2016  . Medicare annual wellness visit, subsequent 09/07/2015  . Medication management 09/07/2015  . NASH (nonalcoholic steatohepatitis) 05/10/2015  . Vitamin D deficiency 01/11/2014  .  Labile hypertension   . Hyperlipidemia   . GERD (gastroesophageal reflux disease)   . Prediabetes   . Morbid obesity (BMI 42.79) 10/07/2013  . Cataract, nuclear 10/30/2012  . Macular hole 10/25/2011   Review of Systems  Constitutional: Negative.   HENT: Negative.  Negative for congestion.   Eyes: Negative.   Respiratory: Negative.  Negative for cough, hemoptysis, sputum production, shortness of breath and wheezing.   Cardiovascular:  Negative.   Gastrointestinal: Negative.   Genitourinary: Negative.   Musculoskeletal: Negative.   Skin: Negative.   Neurological: Negative.   Endo/Heme/Allergies: Negative.   Psychiatric/Behavioral: Negative.       Objective:     Blood pressure 130/78, pulse 90, temperature 97.9 F (36.6 C), resp. rate 14, height 5' 3.5" (1.613 m), weight 255 lb 9.6 oz (115.9 kg), SpO2 97 %. Body mass index is 44.57 kg/m.  General appearance: alert, no distress, WD/WN, female HEENT: normocephalic, sclerae anicteric, TMs pearly, nares patent, no discharge or erythema, pharynx normal Oral cavity: MMM, no lesions Neck: supple, no lymphadenopathy, no thyromegaly, no masses Heart: RRR, normal S1, S2, no murmurs Lungs: CTA bilaterally, no wheezes, rhonchi, or rales Abdomen: +bs, soft, obese, non tender, non distended, no masses, no hepatomegaly, no splenomegaly Musculoskeletal: nontender, no swelling, no obvious deformity Extremities: no edema, no cyanosis, no clubbing Pulses: 2+ symmetric, upper and lower extremities, normal cap refill Neurological: alert, oriented x 3, CN2-12 intact, strength normal upper extremities and lower extremities, sensation normal throughout, DTRs 2+ throughout, no cerebellar signs, gait normal Psychiatric: normal affect, behavior normal, pleasant  Skin: no abnormal nevus, rashes, or lesions.     [name], PA-C   08/12/2017

## 2017-08-12 ENCOUNTER — Encounter: Payer: Self-pay | Admitting: Physician Assistant

## 2017-08-12 ENCOUNTER — Ambulatory Visit (INDEPENDENT_AMBULATORY_CARE_PROVIDER_SITE_OTHER): Payer: Medicare Other | Admitting: Physician Assistant

## 2017-08-12 VITALS — BP 130/78 | HR 90 | Temp 97.9°F | Resp 14 | Ht 63.5 in | Wt 255.6 lb

## 2017-08-12 DIAGNOSIS — R7303 Prediabetes: Secondary | ICD-10-CM

## 2017-08-12 DIAGNOSIS — K7581 Nonalcoholic steatohepatitis (NASH): Secondary | ICD-10-CM | POA: Diagnosis not present

## 2017-08-12 DIAGNOSIS — Z79899 Other long term (current) drug therapy: Secondary | ICD-10-CM | POA: Diagnosis not present

## 2017-08-12 DIAGNOSIS — R928 Other abnormal and inconclusive findings on diagnostic imaging of breast: Secondary | ICD-10-CM | POA: Diagnosis not present

## 2017-08-12 DIAGNOSIS — E785 Hyperlipidemia, unspecified: Secondary | ICD-10-CM | POA: Diagnosis not present

## 2017-08-12 DIAGNOSIS — R0989 Other specified symptoms and signs involving the circulatory and respiratory systems: Secondary | ICD-10-CM | POA: Diagnosis not present

## 2017-08-13 NOTE — Progress Notes (Signed)
consult with Dr Melford Aase.

## 2017-08-14 ENCOUNTER — Ambulatory Visit (INDEPENDENT_AMBULATORY_CARE_PROVIDER_SITE_OTHER): Payer: Medicare Other | Admitting: Pulmonary Disease

## 2017-08-14 DIAGNOSIS — R0602 Shortness of breath: Secondary | ICD-10-CM

## 2017-08-14 LAB — PULMONARY FUNCTION TEST
DL/VA % pred: 142 %
DL/VA: 6.8 ml/min/mmHg/L
DLCO UNC: 20.21 ml/min/mmHg
DLCO cor % pred: 85 %
DLCO cor: 20.46 ml/min/mmHg
DLCO unc % pred: 84 %
FEF 25-75 Post: 0.72 L/sec
FEF 25-75 Pre: 1.4 L/sec
FEF2575-%Change-Post: -48 %
FEF2575-%PRED-PRE: 81 %
FEF2575-%Pred-Post: 42 %
FEV1-%Change-Post: -13 %
FEV1-%PRED-PRE: 74 %
FEV1-%Pred-Post: 64 %
FEV1-POST: 1.18 L
FEV1-Pre: 1.37 L
FEV1FVC-%CHANGE-POST: 0 %
FEV1FVC-%Pred-Pre: 106 %
FEV6-%CHANGE-POST: -14 %
FEV6-%PRED-PRE: 72 %
FEV6-%Pred-Post: 61 %
FEV6-POST: 1.41 L
FEV6-PRE: 1.65 L
FEV6FVC-%PRED-POST: 104 %
FEV6FVC-%PRED-PRE: 104 %
FVC-%CHANGE-POST: -14 %
FVC-%PRED-POST: 59 %
FVC-%PRED-PRE: 69 %
FVC-POST: 1.41 L
FVC-Pre: 1.65 L
POST FEV6/FVC RATIO: 100 %
Post FEV1/FVC ratio: 84 %
Pre FEV1/FVC ratio: 83 %
Pre FEV6/FVC Ratio: 100 %
RV % pred: 71 %
RV: 1.55 L
TLC % PRED: 69 %
TLC: 3.5 L

## 2017-08-14 NOTE — Progress Notes (Signed)
PFT done today. 

## 2017-08-15 ENCOUNTER — Encounter: Payer: Self-pay | Admitting: Physician Assistant

## 2017-08-15 ENCOUNTER — Other Ambulatory Visit: Payer: Self-pay | Admitting: Physician Assistant

## 2017-08-15 DIAGNOSIS — R928 Other abnormal and inconclusive findings on diagnostic imaging of breast: Secondary | ICD-10-CM

## 2017-08-15 LAB — CBC WITH DIFFERENTIAL/PLATELET
BASOS ABS: 31 {cells}/uL (ref 0–200)
Basophils Relative: 0.4 %
EOS ABS: 179 {cells}/uL (ref 15–500)
Eosinophils Relative: 2.3 %
HCT: 36.4 % (ref 35.0–45.0)
Hemoglobin: 11.5 g/dL — ABNORMAL LOW (ref 11.7–15.5)
Lymphs Abs: 2636 cells/uL (ref 850–3900)
MCH: 25.1 pg — AB (ref 27.0–33.0)
MCHC: 31.6 g/dL — ABNORMAL LOW (ref 32.0–36.0)
MCV: 79.5 fL — AB (ref 80.0–100.0)
MPV: 11.1 fL (ref 7.5–12.5)
Monocytes Relative: 7.9 %
NEUTROS PCT: 55.6 %
Neutro Abs: 4337 cells/uL (ref 1500–7800)
PLATELETS: 298 10*3/uL (ref 140–400)
RBC: 4.58 10*6/uL (ref 3.80–5.10)
RDW: 14.3 % (ref 11.0–15.0)
TOTAL LYMPHOCYTE: 33.8 %
WBC: 7.8 10*3/uL (ref 3.8–10.8)
WBCMIX: 616 {cells}/uL (ref 200–950)

## 2017-08-15 LAB — BASIC METABOLIC PANEL WITH GFR
BUN: 14 mg/dL (ref 7–25)
CO2: 34 mmol/L — ABNORMAL HIGH (ref 20–32)
CREATININE: 0.62 mg/dL (ref 0.50–0.99)
Calcium: 9.3 mg/dL (ref 8.6–10.4)
Chloride: 103 mmol/L (ref 98–110)
GFR, Est African American: 107 mL/min/{1.73_m2} (ref >=60)
GFR, Est Non African American: 92 mL/min/{1.73_m2} (ref >=60)
GLUCOSE: 86 mg/dL (ref 65–139)
Potassium: 4.2 mmol/L (ref 3.5–5.3)
SODIUM: 141 mmol/L (ref 135–146)

## 2017-08-15 LAB — TSH: TSH: 1.83 m[IU]/L (ref 0.40–4.50)

## 2017-08-15 LAB — HEPATIC FUNCTION PANEL
AG Ratio: 1.4 (calc) (ref 1.0–2.5)
ALT: 21 U/L (ref 6–29)
AST: 20 U/L (ref 10–35)
Albumin: 4 g/dL (ref 3.6–5.1)
Alkaline phosphatase (APISO): 60 U/L (ref 33–130)
BILIRUBIN DIRECT: 0.1 mg/dL (ref 0.0–0.2)
BILIRUBIN INDIRECT: 0.3 mg/dL (ref 0.2–1.2)
GLOBULIN: 2.8 g/dL (ref 1.9–3.7)
Total Bilirubin: 0.4 mg/dL (ref 0.2–1.2)
Total Protein: 6.8 g/dL (ref 6.1–8.1)

## 2017-08-15 LAB — LIPID PANEL
Cholesterol: 185 mg/dL (ref <200)
HDL: 51 mg/dL (ref >50)
LDL CHOLESTEROL (CALC): 109 mg/dL — AB
Non-HDL Cholesterol (Calc): 134 mg/dL (calc) — ABNORMAL HIGH (ref <130)
Total CHOL/HDL Ratio: 3.6 (calc) (ref <5.0)
Triglycerides: 139 mg/dL (ref <150)

## 2017-08-15 LAB — HEMOGLOBIN A1C
HEMOGLOBIN A1C: 6.2 %{Hb} — AB (ref <5.7)
MEAN PLASMA GLUCOSE: 131 (calc)
eAG (mmol/L): 7.3 (calc)

## 2017-08-15 LAB — MAGNESIUM: MAGNESIUM: 1.8 mg/dL (ref 1.5–2.5)

## 2017-08-16 ENCOUNTER — Ambulatory Visit (INDEPENDENT_AMBULATORY_CARE_PROVIDER_SITE_OTHER): Payer: Medicare Other | Admitting: Pulmonary Disease

## 2017-08-16 ENCOUNTER — Encounter: Payer: Self-pay | Admitting: Pulmonary Disease

## 2017-08-16 VITALS — BP 124/82 | HR 76 | Ht 63.5 in | Wt 258.6 lb

## 2017-08-16 DIAGNOSIS — R0602 Shortness of breath: Secondary | ICD-10-CM

## 2017-08-16 NOTE — Patient Instructions (Signed)
Continue using your inhaler as needed I have reviewed all year tests including pulmonary function test  Return to clinic in 6 months.

## 2017-08-16 NOTE — Progress Notes (Addendum)
Anita Blake    [id number]    Sep 08, 1946  Primary Care Physician:McKeown, Gwyndolyn Saxon, MD  Referring Physician: Unk Pinto, MD [address] [name], [name] [address]  Chief complaint:   Follow up for  Upper airway cough Mild asthma  HPI: Anita Blake is a 71 year old with past medical history of obesity, hyperlipidemia, GERD, and Anita Blake here for evaluation of dyspnea at rest and with exertion. She has non-productive cough with wheezing and chest tightness for the past 6-8 months. She is using the inhaler at home but does not know the name. She does not have any change in symptoms with that. She has history of GERD and diagnosed with Schatzki's ring in August 2017. There are no no seasonal allergies. She was evaluated by cardiology and had exercise stress test with no evidence of ischemia.  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 She feels that her dyspnea has improved since last office visit. She still has some occasional productive cough and wheezing. She is on some kind of inhaler at home but does not recall the name and has forgotten to bring it office today.  Outpatient Encounter Prescriptions as of 08/16/2017  Medication Sig  . aspirin 81 MG tablet Take 81 mg by mouth daily.  . Cholecalciferol (VITAMIN D PO) Take 5,000 Int'l Units by mouth daily.  Marland Kitchen MAGNESIUM PO Take by mouth.  . Pitavastatin Calcium (LIVALO) 4 MG TABS Take 1 tablet (4 mg total) by mouth at bedtime.   No facility-administered encounter medications on file as of 08/16/2017.     Allergies as of 08/16/2017 - Review Complete 08/16/2017  Allergen Reaction Noted  . Anesthetics, halogenated  06/29/2014  . Crestor [rosuvastatin]  01/10/2014  . Lopid [gemfibrozil]  01/10/2014  . Latex Rash and Swelling 09/29/2013    Past Medical History:    Diagnosis Date  . Anemia yrs ago  . Arthritis   . Complication of anesthesia    slow to wake up  . GERD (gastroesophageal reflux disease)   . Hyperlipidemia   . Labile hypertension    no meds  . Nonalcoholic steatohepatitis (NASH)   . Prediabetes   . Vitamin D deficiency     Past Surgical History:  Procedure Laterality Date  . ABDOMINAL HYSTERECTOMY    . cataract surgery Left   . EYE SURGERY Left 2007   macular hole in retina  . index finger surgery Right   . right arm elbow surgery     ligament repair  . TOTAL HIP ARTHROPLASTY Left 10/06/2013   Procedure: LEFT TOTAL HIP ARTHROPLASTY ANTERIOR APPROACH;  Surgeon: Mauri Pole, MD;  Location: [facility];  Service: Orthopedics;  Laterality: Left;    Family History  Problem Relation Age of Onset  . Heart disease Mother        Vague  . Stroke Father   . Alzheimer's disease Other   . Heart attack Brother 70  . Heart attack Sister 62  . Breast cancer Sister   . Breast cancer Cousin     Social History   Social History  . Marital status: Married    Spouse name: N/A  . Number of children: 1  . Years of education: N/A   Occupational History  . Not on file.   Social History Main Topics  . Smoking status: Never Smoker  .  Smokeless tobacco: Never Used  . Alcohol use No  . Drug use: No  . Sexual activity: Not on file   Other Topics Concern  . Not on file   Social History Narrative   Lives with husband.      Review of systems: Review of Systems  Constitutional: Negative for fever and chills.  HENT: Negative.   Eyes: Negative for blurred vision.  Respiratory: as per HPI  Cardiovascular: Negative for chest pain and palpitations.  Gastrointestinal: Negative for vomiting, diarrhea, blood per rectum. Genitourinary: Negative for dysuria, urgency, frequency and hematuria.  Musculoskeletal: Negative for myalgias, back pain and joint pain.  Skin: Negative for itching and rash.  Neurological: Negative for dizziness,  tremors, focal weakness, seizures and loss of consciousness.  Endo/Heme/Allergies: Negative for environmental allergies.  Psychiatric/Behavioral: Negative for depression, suicidal ideas and hallucinations.  All other systems reviewed and are negative.  Physical Exam: Blood pressure 124/82, pulse 76, height 5' 3.5" (1.613 m), weight 258 lb 9.6 oz (117.3 kg), SpO2 100 %. Gen:      No acute distress HEENT:  EOMI, sclera anicteric Neck:     No masses; no thyromegaly Lungs:    Clear to auscultation bilaterally; normal respiratory effort CV:         Regular rate and rhythm; no murmurs Abd:      + bowel sounds; soft, non-tender; no palpable masses, no distension Ext:    No edema; adequate peripheral perfusion Skin:      Warm and dry; no rash Neuro: alert and oriented x 3 Psych: normal mood and affect  Data Reviewed: FENO 06/18/17- < 5  Exercise stress test 05/23/16- mildly impaired exercise tolerance, no evidence of ischemia.  Chest x-ray 05/09/16-no acute cardiopulmonary abnormality.  Chest x-ray 06/18/17-no acute cardiopulmonary abnormality I have reviewed the images personally.  CBC differential 08/12/17-WBC 7.8, eos 2.3%, absolute eos count 230 Blood allergy profile 06/18/17-sensitive to tree pollen, cat, cockroach, dust mite. IgE 746  PFTs 08/14/17 FVC 1.41 (59%] FEV1 1.18 [64%) F/F 84 TLC 69% DLCO 84%, DLCO/VA 142% Mild restriction. No obstruction. Increased diffusion capacity.  Assessment:  Follow-up for cough, mild asthma Suspect she has upper airway cough syndrome from postnasal drip, GERD.  She has allergies and probably very mild asthma. PFTs do not show any obstruction and FENO is low She will continue to use the inhaler she has and will bring it in at next visit so we can identify it.   For postnasal drip she'll continue to use chlorpheniramine over-the-counter 8 mg 3 times daily and Flonase nasal spray. And for the GERD she is on Zantac.  Plan/Recommendations: - Continue  inhaler - Use chlorpheniramine 8 gm tid, Flonase nasal spray - Zantac for GERD.   [name] MD  [facility] Pager [phone] 08/16/2017, 3:40 PM  CC: Unk Pinto, MD

## 2017-08-21 ENCOUNTER — Other Ambulatory Visit: Payer: Self-pay | Admitting: Obstetrics and Gynecology

## 2017-08-21 DIAGNOSIS — Z719 Counseling, unspecified: Secondary | ICD-10-CM

## 2017-08-29 ENCOUNTER — Ambulatory Visit
Admission: RE | Admit: 2017-08-29 | Discharge: 2017-08-29 | Disposition: A | Discharge status: Home / Self Care | Payer: Medicare Other | Source: Ambulatory Visit | Attending: Obstetrics and Gynecology | Admitting: Obstetrics and Gynecology

## 2017-08-29 DIAGNOSIS — Z719 Counseling, unspecified: Secondary | ICD-10-CM

## 2017-09-02 ENCOUNTER — Other Ambulatory Visit: Payer: Self-pay | Admitting: Obstetrics and Gynecology

## 2017-09-02 DIAGNOSIS — R928 Other abnormal and inconclusive findings on diagnostic imaging of breast: Secondary | ICD-10-CM

## 2017-10-17 ENCOUNTER — Ambulatory Visit
Admission: RE | Admit: 2017-10-17 | Discharge: 2017-10-17 | Disposition: A | Discharge status: Home / Self Care | Payer: Medicare Other | Source: Ambulatory Visit | Attending: Obstetrics and Gynecology | Admitting: Obstetrics and Gynecology

## 2017-10-17 ENCOUNTER — Ambulatory Visit: Payer: Medicare Other

## 2017-10-17 DIAGNOSIS — R928 Other abnormal and inconclusive findings on diagnostic imaging of breast: Secondary | ICD-10-CM

## 2017-11-08 ENCOUNTER — Other Ambulatory Visit: Payer: Self-pay | Admitting: Physician Assistant

## 2017-11-17 NOTE — Progress Notes (Signed)
This very nice 71 y.o. MBF presents for 6 month follow up with Hypertension, Hyperlipidemia, Pre-Diabetes and Vitamin D Deficiency.      Patient is followed expectantly for labile HTN & BP has been controlled at home. Today's BP was initially sl elevated and rechecked at goal - 140/80. Patient has had no complaints of any cardiac type chest pain, palpitations, dyspnea / orthopnea / PND, dizziness, claudication, or dependent edema.     Hyperlipidemia is controlled with diet & meds. Patient denies myalgias or other med SE's. Last Lipids were near goal: Lab Results  Component Value Date   CHOL 185 08/12/2017   HDL 51 08/12/2017   LDLCALC 103 (H) 05/16/2017   TRIG 139 08/12/2017   CHOLHDL 3.6 08/12/2017      Also, the patient has Morbid Obesity (BMI 43+) and  T2_NIDDM (A1c 6.5% in Apr 2015)  attempting to control w/diet  and has had no symptoms of reactive hypoglycemia, diabetic polys, paresthesias or visual blurring.  Last A1c was improved, but not at goal: Lab Results  Component Value Date   HGBA1C 6.2 (H) 08/12/2017      Further, the patient also has history of Vitamin D Deficiency ("41" on treatment in 2016) and supplements vitamin D without any suspected side-effects. Last vitamin D was   Lab Results  Component Value Date   VD25OH 52 05/16/2017   Current Outpatient Medications on File Prior to Visit  Medication Sig  . aspirin 81 MG tablet Take 81 mg by mouth daily.  . Cholecalciferol (VITAMIN D PO) Take 5,000 Int'l Units by mouth daily.  Marland Kitchen LIVALO 4 MG TABS TAKE 1 TABLET BY MOUTH AT BEDTIME  . MAGNESIUM PO Take 250 mg by mouth.    No current facility-administered medications on file prior to visit.    Allergies  Allergen Reactions  . Anesthetics, Halogenated     Difficulty with waking up from anesthesia from [facility], had aphasia, negative CT  . Crestor [Rosuvastatin]   . Lopid [Gemfibrozil]   . Latex Rash and Swelling    [Rash] redness/swelling/rash   PMHx:   Past Medical  History:  Diagnosis Date  . Anemia yrs ago  . Arthritis   . Complication of anesthesia    slow to wake up  . GERD (gastroesophageal reflux disease)   . Hyperlipidemia   . Labile hypertension    no meds  . Nonalcoholic steatohepatitis (NASH)   . Prediabetes   . Vitamin D deficiency    Immunization History  Administered Date(s) Administered  . DT 05/10/2015  . Td 07/27/2005   Past Surgical History:  Procedure Laterality Date  . ABDOMINAL HYSTERECTOMY    . cataract surgery Left   . EYE SURGERY Left 2007   macular hole in retina  . index finger surgery Right   . right arm elbow surgery     ligament repair  . TOTAL HIP ARTHROPLASTY Left 10/06/2013   Procedure: LEFT TOTAL HIP ARTHROPLASTY ANTERIOR APPROACH;  Surgeon: Mauri Pole, MD;  Location: [facility];  Service: Orthopedics;  Laterality: Left;   FHx:    Reviewed / unchanged  SHx:    Reviewed / unchanged  Systems Review:  Constitutional: Denies fever, chills, wt changes, headaches, insomnia, fatigue, night sweats, change in appetite. Eyes: Denies redness, blurred vision, diplopia, discharge, itchy, watery eyes.  ENT: Denies discharge, congestion, post nasal drip, epistaxis, sore throat, earache, hearing loss, dental pain, tinnitus, vertigo, sinus pain, snoring.  CV: Denies chest pain, palpitations,  irregular heartbeat, syncope, dyspnea, diaphoresis, orthopnea, PND, claudication or edema. Respiratory: denies cough, dyspnea, DOE, pleurisy, hoarseness, laryngitis, wheezing.  Gastrointestinal: Denies dysphagia, odynophagia, heartburn, reflux, water brash, abdominal pain or cramps, nausea, vomiting, bloating, diarrhea, constipation, hematemesis, melena, hematochezia  or hemorrhoids. Genitourinary: Denies dysuria, frequency, urgency, nocturia, hesitancy, discharge, hematuria or flank pain. Musculoskeletal: Denies arthralgias, myalgias, stiffness, jt. swelling, pain, limping or strain/sprain.  Skin: Denies pruritus, rash, hives,  warts, acne, eczema or change in skin lesion(s). Neuro: No weakness, tremor, incoordination, spasms, paresthesia or pain. Psychiatric: Denies confusion, memory loss or sensory loss. Endo: Denies change in weight, skin or hair change.  Heme/Lymph: No excessive bleeding, bruising or enlarged lymph nodes.  Physical Exam  BP 140/80   Pulse 76   Temp (!) 97.3 F (36.3 C)   Resp 18   Ht 5' 3.5" (1.613 m)   Wt 251 lb (113.9 kg)   BMI 43.77 kg/m   Appears well nourished, well groomed  and in no distress.  Eyes: PERRLA, EOMs, conjunctiva no swelling or erythema. Sinuses: No frontal/maxillary tenderness ENT/Mouth: EAC's clear, TM's nl w/o erythema, bulging. Nares clear w/o erythema, swelling, exudates. Oropharynx clear without erythema or exudates. Oral hygiene is good. Tongue normal, non obstructing. Hearing intact.  Neck: Supple. Thyroid nl. Car 2+/2+ without bruits, nodes or JVD. Chest: Respirations nl with BS clear & equal w/o rales, rhonchi, wheezing or stridor.  Cor: Heart sounds normal w/ regular rate and rhythm without sig. murmurs, gallops, clicks or rubs. Peripheral pulses normal and equal  without edema.  Abdomen: Soft & bowel sounds normal. Non-tender w/o guarding, rebound, hernias, masses or organomegaly.  Lymphatics: Unremarkable.  Musculoskeletal: Full ROM all peripheral extremities, joint stability, 5/5 strength and normal gait.  Skin: Warm, dry without exposed rashes, lesions or ecchymosis apparent.  Neuro: Cranial nerves intact, reflexes equal bilaterally. Sensory-motor testing grossly intact. Tendon reflexes grossly intact.  Pysch: Alert & oriented x 3.  Insight and judgement nl & appropriate. No ideations.  Assessment and Plan:  1. Labile hypertension  - Continue medication, monitor blood pressure at home.  - Continue DASH diet. Reminder to go to the ER if any CP,  SOB, nausea, dizziness, severe HA, changes vision/speech.  - BASIC METABOLIC PANEL WITH GFR - CBC  with Differential/Platelet - Magnesium - TSH  2. Hyperlipidemia, mixed  - Continue diet/meds, exercise,& lifestyle modifications.  - Continue monitor periodic cholesterol/liver & renal functions   - Hepatic function panel - Lipid panel - TSH  3. Diabetes mellitus without complication ([place])  - Very long discussion today re: dieting & weight loss - Hemoglobin A1c - Insulin, random  4. Vitamin D deficiency  - Continue diet, exercise, lifestyle modifications.  - Monitor appropriate labs. - Continue supplementation. - VITAMIN D 25 Hydroxy   5. Class 3 severe obesity due to excess calories with serious comorbidity and body mass index (BMI) of 40.0 to 44.9 in adult (HCC)  - Hemoglobin A1c - Insulin, random  6. Gluttony   7. NASH (nonalcoholic steatohepatitis)  - Hepatic function panel  8. Medication management  - BASIC METABOLIC PANEL WITH GFR - CBC with Differential/Platelet - Hepatic function panel - Magnesium - Lipid panel - TSH - Hemoglobin A1c - Insulin, random - VITAMIN D 25 Hydroxy        Discussed  regular exercise, BP monitoring, weight control to achieve/maintain BMI less than 25 and discussed med and SE's. Recommended labs to assess and monitor clinical status with further disposition pending results of labs. Over  30 minutes of exam, counseling, chart review was performed.

## 2017-11-17 NOTE — Patient Instructions (Signed)

## 2017-11-18 ENCOUNTER — Ambulatory Visit (INDEPENDENT_AMBULATORY_CARE_PROVIDER_SITE_OTHER): Payer: Medicare Other | Admitting: Internal Medicine

## 2017-11-18 ENCOUNTER — Encounter: Payer: Self-pay | Admitting: Internal Medicine

## 2017-11-18 VITALS — BP 140/80 | HR 76 | Temp 97.3°F | Resp 18 | Ht 63.5 in | Wt 251.0 lb

## 2017-11-18 DIAGNOSIS — E559 Vitamin D deficiency, unspecified: Secondary | ICD-10-CM

## 2017-11-18 DIAGNOSIS — R632 Polyphagia: Secondary | ICD-10-CM

## 2017-11-18 DIAGNOSIS — E119 Type 2 diabetes mellitus without complications: Secondary | ICD-10-CM | POA: Diagnosis not present

## 2017-11-18 DIAGNOSIS — R7309 Other abnormal glucose: Secondary | ICD-10-CM | POA: Insufficient documentation

## 2017-11-18 DIAGNOSIS — E782 Mixed hyperlipidemia: Secondary | ICD-10-CM | POA: Diagnosis not present

## 2017-11-18 DIAGNOSIS — R0989 Other specified symptoms and signs involving the circulatory and respiratory systems: Secondary | ICD-10-CM

## 2017-11-18 DIAGNOSIS — Z79899 Other long term (current) drug therapy: Secondary | ICD-10-CM | POA: Diagnosis not present

## 2017-11-18 DIAGNOSIS — Z6841 Body Mass Index (BMI) 40.0 and over, adult: Secondary | ICD-10-CM | POA: Diagnosis not present

## 2017-11-18 DIAGNOSIS — K7581 Nonalcoholic steatohepatitis (NASH): Secondary | ICD-10-CM

## 2017-11-19 LAB — HEMOGLOBIN A1C
Hgb A1c MFr Bld: 6 % of total Hgb — ABNORMAL HIGH (ref <5.7)
MEAN PLASMA GLUCOSE: 126 (calc)
eAG (mmol/L): 7 (calc)

## 2017-11-19 LAB — HEPATIC FUNCTION PANEL
AG Ratio: 1.4 (calc) (ref 1.0–2.5)
ALKALINE PHOSPHATASE (APISO): 56 U/L (ref 33–130)
ALT: 23 U/L (ref 6–29)
AST: 20 U/L (ref 10–35)
Albumin: 4 g/dL (ref 3.6–5.1)
BILIRUBIN INDIRECT: 0.5 mg/dL (ref 0.2–1.2)
Bilirubin, Direct: 0.1 mg/dL (ref 0.0–0.2)
GLOBULIN: 2.8 g/dL (ref 1.9–3.7)
TOTAL PROTEIN: 6.8 g/dL (ref 6.1–8.1)
Total Bilirubin: 0.6 mg/dL (ref 0.2–1.2)

## 2017-11-19 LAB — CBC WITH DIFFERENTIAL/PLATELET
BASOS ABS: 37 {cells}/uL (ref 0–200)
Basophils Relative: 0.4 %
EOS PCT: 1.7 %
Eosinophils Absolute: 156 cells/uL (ref 15–500)
HEMATOCRIT: 37.8 % (ref 35.0–45.0)
Hemoglobin: 12 g/dL (ref 11.7–15.5)
Lymphs Abs: 3275 cells/uL (ref 850–3900)
MCH: 25.2 pg — ABNORMAL LOW (ref 27.0–33.0)
MCHC: 31.7 g/dL — AB (ref 32.0–36.0)
MCV: 79.4 fL — ABNORMAL LOW (ref 80.0–100.0)
MONOS PCT: 6.2 %
MPV: 11 fL (ref 7.5–12.5)
NEUTROS PCT: 56.1 %
Neutro Abs: 5161 cells/uL (ref 1500–7800)
Platelets: 302 10*3/uL (ref 140–400)
RBC: 4.76 10*6/uL (ref 3.80–5.10)
RDW: 13.6 % (ref 11.0–15.0)
Total Lymphocyte: 35.6 %
WBC mixed population: 570 cells/uL (ref 200–950)
WBC: 9.2 10*3/uL (ref 3.8–10.8)

## 2017-11-19 LAB — LIPID PANEL
CHOLESTEROL: 201 mg/dL — AB (ref <200)
HDL: 59 mg/dL (ref >50)
LDL Cholesterol (Calc): 119 mg/dL (calc) — ABNORMAL HIGH
Non-HDL Cholesterol (Calc): 142 mg/dL (calc) — ABNORMAL HIGH (ref <130)
Total CHOL/HDL Ratio: 3.4 (calc) (ref <5.0)
Triglycerides: 123 mg/dL (ref <150)

## 2017-11-19 LAB — INSULIN, RANDOM: Insulin: 13.7 u[IU]/mL (ref 2.0–19.6)

## 2017-11-19 LAB — BASIC METABOLIC PANEL WITH GFR
BUN: 17 mg/dL (ref 7–25)
CHLORIDE: 102 mmol/L (ref 98–110)
CO2: 32 mmol/L (ref 20–32)
Calcium: 9.3 mg/dL (ref 8.6–10.4)
Creat: 0.84 mg/dL (ref 0.50–0.99)
GFR, Est African American: 82 mL/min/{1.73_m2} (ref >=60)
GFR, Est Non African American: 71 mL/min/{1.73_m2} (ref >=60)
GLUCOSE: 96 mg/dL (ref 65–99)
Potassium: 4.2 mmol/L (ref 3.5–5.3)
SODIUM: 140 mmol/L (ref 135–146)

## 2017-11-19 LAB — TSH: TSH: 2.28 m[IU]/L (ref 0.40–4.50)

## 2017-11-19 LAB — VITAMIN D 25 HYDROXY (VIT D DEFICIENCY, FRACTURES): VIT D 25 HYDROXY: 47 ng/mL (ref 30–100)

## 2017-11-19 LAB — MAGNESIUM: MAGNESIUM: 1.9 mg/dL (ref 1.5–2.5)

## 2017-12-19 ENCOUNTER — Encounter: Payer: Self-pay | Admitting: Physician Assistant

## 2017-12-19 ENCOUNTER — Ambulatory Visit (INDEPENDENT_AMBULATORY_CARE_PROVIDER_SITE_OTHER): Payer: Medicare Other | Admitting: Physician Assistant

## 2017-12-19 VITALS — BP 130/82 | HR 78 | Temp 97.7°F | Resp 16 | Ht 63.5 in | Wt 244.6 lb

## 2017-12-19 DIAGNOSIS — R29818 Other symptoms and signs involving the nervous system: Secondary | ICD-10-CM | POA: Diagnosis not present

## 2017-12-19 DIAGNOSIS — R2689 Other abnormalities of gait and mobility: Secondary | ICD-10-CM | POA: Diagnosis not present

## 2017-12-19 DIAGNOSIS — R32 Unspecified urinary incontinence: Secondary | ICD-10-CM

## 2017-12-19 NOTE — Progress Notes (Signed)
Subjective:    Patient ID: Anita Blake, female    DOB: [birth year], 72 y.o.   MRN: [id number]  HPI 72 y.o. obese AAF with history of HTN, NASH, DM, chol presents with imbalance x 2-3 days. Tuesday afternoon, felt feeling of imbalance like she is going to fall. Then yesterday, it was all day. Had had incontinence x 2 that is new and has had some increased frequency in urination with urgency/incontinence.    She denies any associated neurological complications or symptoms, such as one-sided weakness, numbness, tingling, slurring of speech, droopy face, swallowing difficulties, diplopia, vision loss, hearing loss or tinnitus.  CT head 2014 normal   Blood pressure 130/82, pulse 78, temperature 97.7 F (36.5 C), resp. rate 16, height 5' 3.5" (1.613 m), weight 244 lb 9.6 oz (110.9 kg), SpO2 97 %.  Medications Current Outpatient Medications on File Prior to Visit  Medication Sig  . aspirin 81 MG tablet Take 81 mg by mouth daily.  . Cholecalciferol (VITAMIN D PO) Take 5,000 Int'l Units by mouth daily.  Marland Kitchen LIVALO 4 MG TABS TAKE 1 TABLET BY MOUTH AT BEDTIME  . MAGNESIUM PO Take 250 mg by mouth.    No current facility-administered medications on file prior to visit.     Problem list She has Morbid obesity (BMI 42.79); Labile hypertension; Hyperlipidemia; GERD (gastroesophageal reflux disease); Vitamin D deficiency; NASH (nonalcoholic steatohepatitis); Medicare annual wellness visit, subsequent; Medication management; Macular hole; Cataract, nuclear; Abnormal MRI, breast; and Diabetes mellitus without complication ([place]) on their problem list.  Review of Systems  Constitutional: Negative.  Negative for chills, fatigue and fever.  HENT: Positive for tinnitus. Negative for congestion, dental problem, drooling, ear discharge, ear pain, facial swelling, hearing loss, mouth sores, nosebleeds, postnasal drip, rhinorrhea, sinus pressure, sneezing, sore throat, trouble swallowing and voice change.    Eyes: Negative.  Negative for visual disturbance.  Respiratory: Negative.   Cardiovascular: Negative.   Gastrointestinal: Negative.   Genitourinary: Positive for frequency and urgency.       INCONTINENCE  Musculoskeletal: Negative.  Negative for neck pain and neck stiffness.  Neurological: Positive for dizziness. Negative for tremors, seizures, syncope, facial asymmetry, speech difficulty, weakness, light-headedness, numbness and headaches.  Psychiatric/Behavioral: Negative.        Objective:   Physical Exam  Constitutional: She is oriented to person, place, and time. She appears well-developed and well-nourished. No distress.  HENT:  Head: Atraumatic.  Right Ear: External ear normal.  Left Ear: External ear normal.  Nose: Nose normal.  Mouth/Throat: Oropharynx is clear and moist. No oropharyngeal exudate.  Eyes: Conjunctivae and EOM are normal. Pupils are equal, round, and reactive to light.  Abnormal pupils but has had surgery previously, + reactive to light  Neck: Normal range of motion. Neck supple.  Cardiovascular: Normal rate, regular rhythm and normal heart sounds.  Pulmonary/Chest: Effort normal and breath sounds normal. She has no wheezes.  Abdominal: Soft. Bowel sounds are normal. There is no tenderness.  Musculoskeletal: Normal range of motion.  Neurological: She is alert and oriented to person, place, and time. She has normal strength. She displays normal reflexes. No cranial nerve deficit or sensory deficit. Gait (GAIT IS UNSTABLE) abnormal. Coordination normal.  Skin: She is not diaphoretic.      Assessment & Plan:   Patient with new imbalance, incontinence no acute neurological deficits today Rule out infection/UTI, check labs Get MRI rule out NPH, less likely stroke If negative possible vertigo Continue statin/ASA   Urinary incontinence, unspecified  type -     Urinalysis, Routine w reflex microscopic -     Urine Culture  Other symptoms and signs involving  the nervous system -     MR Brain Wo Contrast; Future  Imbalance -     CBC with Differential/Platelet -     BASIC METABOLIC PANEL WITH GFR -     Hepatic function panel -     TSH

## 2017-12-19 NOTE — Patient Instructions (Signed)
Will go to the ER if  headache, changes vision/speech, worsening imbalance, weakness.   Normal-Pressure Hydrocephalus Normal-pressure hydrocephalus (NPH) is a buildup of fluid (cerebrospinal fluid [CSF]) inside the brain. CSF is normal within the brain, but too much fluid can affect your brain's function. NPH commonly occurs in people older than 70 years of age. What are the causes? The cause of NPH is not always known. It can be caused by anything that blocks the flow of CSF. What are the signs or symptoms? Since many symptoms of NPH are also associated with conditions sometimes seen in older people, taking care to note changes in behavior and mental function is important. Symptoms of NPH may include:  Difficulty walking, such as: ? Problems when beginning to walk. ? Feet being "frozen" to the floor. ? Shuffling feet when walking. ? Unsteadiness.  Problems with bowel and bladder control.  Memory problems such as forgetfulness, lack of concentration, dull mood, or confusion.  How is this diagnosed?  A medical history and physical exam will be done. A physical exam can reveal walking (gait) changes. Reflexes may be increased in the lower legs.  Tests can include: ? Lumbar puncture (spinal tap). ? CT scan of your head. ? MRI scan of your head. How is this treated? NPH is treated by having surgery to place a ventriculoperitoneal shunt in the brain. The ventriculoperitoneal shunt drains the excess CSF. This information is not intended to replace advice given to you by your health care provider. Make sure you discuss any questions you have with your health care provider. Document Released: 04/19/2014 Document Revised: 07/30/2016 Document Reviewed: 12/08/2013 Elsevier Interactive Patient Education  2017 [employer].

## 2017-12-20 LAB — URINE CULTURE
MICRO NUMBER:: 90010916
SPECIMEN QUALITY:: ADEQUATE

## 2017-12-21 LAB — HEPATIC FUNCTION PANEL
AG Ratio: 1.4 (calc) (ref 1.0–2.5)
ALT: 36 U/L — ABNORMAL HIGH (ref 6–29)
AST: 31 U/L (ref 10–35)
Albumin: 4.2 g/dL (ref 3.6–5.1)
Alkaline phosphatase (APISO): 65 U/L (ref 33–130)
BILIRUBIN DIRECT: 0.1 mg/dL (ref 0.0–0.2)
BILIRUBIN INDIRECT: 0.3 mg/dL (ref 0.2–1.2)
GLOBULIN: 3 g/dL (ref 1.9–3.7)
Total Bilirubin: 0.4 mg/dL (ref 0.2–1.2)
Total Protein: 7.2 g/dL (ref 6.1–8.1)

## 2017-12-21 LAB — CBC WITH DIFFERENTIAL/PLATELET
BASOS PCT: 0.5 %
Basophils Absolute: 51 cells/uL (ref 0–200)
EOS PCT: 2.2 %
Eosinophils Absolute: 222 cells/uL (ref 15–500)
HEMATOCRIT: 39.6 % (ref 35.0–45.0)
HEMOGLOBIN: 12.7 g/dL (ref 11.7–15.5)
LYMPHS ABS: 3485 {cells}/uL (ref 850–3900)
MCH: 25.1 pg — ABNORMAL LOW (ref 27.0–33.0)
MCHC: 32.1 g/dL (ref 32.0–36.0)
MCV: 78.4 fL — ABNORMAL LOW (ref 80.0–100.0)
MPV: 10.8 fL (ref 7.5–12.5)
Monocytes Relative: 7.2 %
NEUTROS ABS: 5616 {cells}/uL (ref 1500–7800)
Neutrophils Relative %: 55.6 %
Platelets: 337 10*3/uL (ref 140–400)
RBC: 5.05 10*6/uL (ref 3.80–5.10)
RDW: 13.7 % (ref 11.0–15.0)
Total Lymphocyte: 34.5 %
WBC mixed population: 727 cells/uL (ref 200–950)
WBC: 10.1 10*3/uL (ref 3.8–10.8)

## 2017-12-21 LAB — URINALYSIS, ROUTINE W REFLEX MICROSCOPIC
BILIRUBIN URINE: NEGATIVE
GLUCOSE, UA: NEGATIVE
Hgb urine dipstick: NEGATIVE
Ketones, ur: NEGATIVE
Leukocytes, UA: NEGATIVE
Nitrite: NEGATIVE
PROTEIN: NEGATIVE
Specific Gravity, Urine: 1.014 (ref 1.001–1.03)
pH: >=8.5 — AB (ref 5.0–8.0)

## 2017-12-21 LAB — TEST AUTHORIZATION

## 2017-12-21 LAB — IRON,TIBC AND FERRITIN PANEL
%SAT: 19 % (ref 11–50)
FERRITIN: 172 ng/mL (ref 20–288)
IRON: 69 ug/dL (ref 45–160)
TIBC: 372 ug/dL (ref 250–450)

## 2017-12-21 LAB — BASIC METABOLIC PANEL WITH GFR
BUN: 16 mg/dL (ref 7–25)
CHLORIDE: 99 mmol/L (ref 98–110)
CO2: 36 mmol/L — ABNORMAL HIGH (ref 20–32)
Calcium: 9.9 mg/dL (ref 8.6–10.4)
Creat: 0.81 mg/dL (ref 0.50–0.99)
GFR, EST AFRICAN AMERICAN: 86 mL/min/{1.73_m2} (ref >=60)
GFR, Est Non African American: 74 mL/min/{1.73_m2} (ref >=60)
Glucose, Bld: 100 mg/dL — ABNORMAL HIGH (ref 65–99)
Potassium: 5.2 mmol/L (ref 3.5–5.3)
Sodium: 141 mmol/L (ref 135–146)

## 2017-12-21 LAB — TSH: TSH: 2.73 m[IU]/L (ref 0.40–4.50)

## 2017-12-21 LAB — VITAMIN B12: VITAMIN B 12: 855 pg/mL (ref 200–1100)

## 2017-12-29 ENCOUNTER — Other Ambulatory Visit: Payer: Medicare Other

## 2018-01-05 ENCOUNTER — Ambulatory Visit
Admission: RE | Admit: 2018-01-05 | Discharge: 2018-01-05 | Disposition: A | Discharge status: Home / Self Care | Payer: Medicare Other | Source: Ambulatory Visit | Attending: Physician Assistant | Admitting: Physician Assistant

## 2018-01-05 DIAGNOSIS — R29818 Other symptoms and signs involving the nervous system: Secondary | ICD-10-CM

## 2018-02-20 NOTE — Progress Notes (Signed)
MEDICARE ANNUAL WELLNESS VISIT AND FOLLOW UP  Assessment:   Diagnoses and all orders for this visit:  Encounter for Medicare annual wellness exam  Labile hypertension At goal off medications; continue to monitor Monitor blood pressure at home; call if consistently over 130/80 Continue DASH diet.   Reminder to go to the ER if any CP, SOB, nausea, dizziness, severe HA, changes vision/speech, left arm numbness and tingling and jaw pain.  Gastroesophageal reflux disease, esophagitis presence not specified Well managed on current medications Discussed diet, avoiding triggers and other lifestyle changes  NASH (nonalcoholic steatohepatitis) Weight loss advised -     Hepatic function panel  Prediabetes Discussed disease and risks Discussed diet/exercise, weight management  Dietary recommendations Physical Activity recommendations -     Hemoglobin A1c  Abnormal MRI, breast Due for 6 month follow up MRI; scheduled after discussion with patient  Age-related nuclear cataract, unspecified laterality Followed by ophthalmonology  Hyperlipidemia, unspecified hyperlipidemia type Continue statin medication Continue low cholesterol diet and exercise.  Check lipid panel.  -     Lipid panel -     TSH  Macular hole, unspecified laterality Followed by ophthalmology  Medication management -     CBC with Differential/Platelet -     BASIC METABOLIC PANEL WITH GFR -     Hepatic function panel  Morbid obesity (BMI 42.79) Long discussion about weight loss, diet, and exercise Recommended diet heavy in fruits and veggies and low in animal meats, cheeses, and dairy products, appropriate calorie intake Discussed appropriate weight for height  Follow up at next visit  Vitamin D deficiency -     VITAMIN D 25 Hydroxy (Vit-D Deficiency, Fractures)   Over 40 minutes of exam, counseling, chart review and critical decision making was performed Future Appointments  Date Time Provider  [place]  02/24/2018  2:30 PM [name], MD [facility] None  05/27/2018  9:00 AM Liane Comber, NP [facility] None     Plan:   During the course of the visit the patient was educated and counseled about appropriate screening and preventive services including:    Pneumococcal vaccine   Prevnar 13  Influenza vaccine  Td vaccine  Screening electrocardiogram  Bone densitometry screening  Colorectal cancer screening  Diabetes screening  Glaucoma screening  Nutrition counseling   Advanced directives: requested   Subjective:  Anita Blake is a 72 y.o. female who presents for Medicare Annual Wellness Visit and 3 month follow up.   BMI is Body mass index is 42.2 kg/m., she has been working on diet and exercise. Wt Readings from Last 3 Encounters:  02/24/18 242 lb (109.8 kg)  12/19/17 244 lb 9.6 oz (110.9 kg)  11/18/17 251 lb (113.9 kg)    Her blood pressure has been controlled at home, today their BP is BP: 136/82 She does workout. She denies chest pain, shortness of breath, dizziness.   She is on cholesterol medication and denies myalgias. Her cholesterol is not at goal. The cholesterol last visit was:   Lab Results  Component Value Date   CHOL 201 (H) 11/18/2017   HDL 59 11/18/2017   LDLCALC 103 (H) 05/16/2017   TRIG 123 11/18/2017   CHOLHDL 3.4 11/18/2017    She has been working on diet and exercise for prediabetes/diet controlled T2DM, and denies foot ulcerations, increased appetite, nausea, paresthesia of the feet, polydipsia, polyuria, visual disturbances, vomiting and weight loss. Last A1C in the office was:  Lab Results  Component Value Date   HGBA1C 6.0 (  H) 11/18/2017   Last GFR: Lab Results  Component Value Date   GFRAA 86 12/19/2017   Patient is on Vitamin D supplement but remained below goal of 70 at last check:    Lab Results  Component Value Date   VD25OH 47 11/18/2017      Medication Review: Current Outpatient  Medications on File Prior to Visit  Medication Sig Dispense Refill  . aspirin 81 MG tablet Take 81 mg by mouth daily.    . Cholecalciferol (VITAMIN D PO) Take 5,000 Int'l Units by mouth daily.    Marland Kitchen LIVALO 4 MG TABS TAKE 1 TABLET BY MOUTH AT BEDTIME (Patient taking differently: TAKE 1/2 TABLET BY MOUTH AT BEDTIME) 90 tablet 1  . MAGNESIUM PO Take 250 mg by mouth.      No current facility-administered medications on file prior to visit.     Allergies  Allergen Reactions  . Anesthetics, Halogenated     Difficulty with waking up from anesthesia from [facility], had aphasia, negative CT  . Crestor [Rosuvastatin]   . Lopid [Gemfibrozil]   . Latex Rash and Swelling    [Rash] redness/swelling/rash    Current Problems (verified) Patient Active Problem List   Diagnosis Date Noted  . Diabetes mellitus without complication ([place]) 44/92/0100  . Abnormal MRI, breast 08/12/2017  . Encounter for Medicare annual wellness exam 09/07/2015  . Medication management 09/07/2015  . NASH (nonalcoholic steatohepatitis) 05/10/2015  . Vitamin D deficiency 01/11/2014  . Labile hypertension   . Hyperlipidemia   . GERD (gastroesophageal reflux disease)   . Morbid obesity (BMI 42.79) 10/07/2013  . Cataract 10/30/2012  . Macular hole 10/25/2011    Screening Tests Immunization History  Administered Date(s) Administered  . DT 05/10/2015  . Td 07/27/2005   Preventative care: Last colonoscopy: negative cologuard 2016 Colonoscopy 2017 Dr. Earlean Shawl EGD 2017 Last mammogram: 05/2016 MRI breasts: 07/29/2017 Last pap smear/pelvic exam: 05/2017 - by Harle Battiest - sees annually DEXA:2002, has had since then in last 5 years -    CT head 09/2013 MRI head: 12/2017 [place] AB 2012 US Soft tissues 2011 Stress test 05/2016  Prior vaccinations: TD or Tdap: 2016 Influenza: declines Pneumococcal: declines Prevnar13: declines Shingles/Zostavax: declines  Names of Other Physician/Practitioners you currently use: 1. [facility] here for primary care 2. Dr. Kathrin Penner, Dr. Renford Dills, eye doctor, wears glasses 9/ 2018  3. Dr. Randol Kern, Dr. Rayann Heman, dentist, last visit 12/03/2017 - sees q3 month  Patient Care Team: Unk Pinto, MD as PCP - General (Internal Medicine) Richmond Campbell, MD as Consulting Physician (Gastroenterology) Delila Pereyra, MD as Consulting Physician (Gynecology) Paralee Cancel, MD as Consulting Physician (Orthopedic Surgery)  SURGICAL HISTORY She  has a past surgical history that includes Abdominal hysterectomy; Eye surgery (Left, 2007); cataract surgery (Left); right arm elbow surgery; index finger surgery (Right); and Total hip arthroplasty (Left, 10/06/2013). FAMILY HISTORY Her family history includes Alzheimer's disease in her other; Breast cancer in her cousin and sister; Heart attack (age of onset: 48) in her sister; Heart attack (age of onset: 107) in her brother; Heart disease in her mother; Stroke in her father. SOCIAL HISTORY She  reports that  has never smoked. she has never used smokeless tobacco. She reports that she does not drink alcohol or use drugs.   MEDICARE WELLNESS OBJECTIVES: Physical activity: Current Exercise Habits: Home exercise routine, Type of exercise: walking, Time (Minutes): 60, Frequency (Times/Week): 7, Weekly Exercise (Minutes/Week): 420, Intensity: Moderate, Exercise limited by: None identified Cardiac  risk factors: Cardiac Risk Factors include: advanced age (>5mn, >>38women);dyslipidemia;diabetes mellitus;hypertension;obesity (BMI >30kg/m2) Depression/mood screen:   [facility] 02/24/2018  Decreased Interest 0  Down, Depressed, Hopeless 0  PHQ - 2 Score 0    ADLs:  In your present state of health, do you have any difficulty performing the following activities: 02/24/2018 11/18/2017  Hearing? N N  Vision? N N  Difficulty concentrating or making decisions? N N  Walking or climbing stairs? N N  Dressing or  bathing? N N  Doing errands, shopping? N N  Some recent data might be hidden     Cognitive Testing  Alert? Yes  Normal Appearance?Yes  Oriented to person? Yes  Place? Yes   Time? Yes  Recall of three objects?  Yes  Can perform simple calculations? Yes  Displays appropriate judgment?Yes  Can read the correct time from a watch face?Yes  EOL planning: Does Patient Have a Medical Advance Directive?: Yes Type of Advance Directive: Living will, Healthcare Power of Attorney Does patient want to make changes to medical advance directive?: No - Patient declined Copy of [place]in Chart?: No - copy requested  Review of Systems  Constitutional: Negative for malaise/fatigue and weight loss.  HENT: Negative for hearing loss and tinnitus.   Eyes: Negative for blurred vision and double vision.  Respiratory: Negative for cough, sputum production, shortness of breath and wheezing.   Cardiovascular: Negative for chest pain, palpitations, orthopnea, claudication, leg swelling and PND.  Gastrointestinal: Negative for abdominal pain, blood in stool, constipation, diarrhea, heartburn, melena, nausea and vomiting.  Genitourinary: Negative.   Musculoskeletal: Negative for falls, joint pain and myalgias.  Skin: Negative for rash.  Neurological: Negative for dizziness, tingling, sensory change, weakness and headaches.  Endo/Heme/Allergies: Negative for polydipsia.  Psychiatric/Behavioral: Negative.  Negative for depression, memory loss, substance abuse and suicidal ideas. The patient is not nervous/anxious and does not have insomnia.   All other systems reviewed and are negative.    Objective:     Today's Vitals   02/24/18 0843  BP: 136/82  Pulse: 70  Temp: 97.9 F (36.6 C)  SpO2: 96%  Weight: 242 lb (109.8 kg)  Height: 5' 3.5" (1.613 m)   Body mass index is 42.2 kg/m.  General appearance: alert, no distress, WD/WN, female HEENT: normocephalic, sclerae anicteric, TMs  pearly, nares patent, no discharge or erythema, pharynx normal Oral cavity: MMM, no lesions Neck: supple, no lymphadenopathy, no thyromegaly, no masses Heart: RRR, normal S1, S2, no murmurs Lungs: CTA bilaterally, no wheezes, rhonchi, or rales Abdomen: +bs, soft, non tender, non distended, no masses, no hepatomegaly, no splenomegaly Musculoskeletal: nontender, no swelling, no obvious deformity Extremities: no edema, no cyanosis, no clubbing Pulses: 2+ symmetric, upper and lower extremities, normal cap refill Neurological: alert, oriented x 3, CN2-12 intact, strength normal upper extremities and lower extremities, sensation normal throughout, DTRs 2+ throughout, no cerebellar signs, gait normal Psychiatric: normal affect, behavior normal, pleasant   Medicare Attestation I have personally reviewed: The patient's medical and social history Their use of alcohol, tobacco or illicit drugs Their current medications and supplements The patient's functional ability including ADLs,fall risks, home safety risks, cognitive, and hearing and visual impairment Diet and physical activities Evidence for depression or mood disorders  The patient's weight, height, BMI, and visual acuity have been recorded in the chart.  I have made referrals, counseling, and provided education to the patient based on review of the above and I have provided the patient  with a written personalized care plan for preventive services.     Izora Ribas, NP   02/24/2018

## 2018-02-24 ENCOUNTER — Ambulatory Visit (INDEPENDENT_AMBULATORY_CARE_PROVIDER_SITE_OTHER): Payer: Medicare Other | Admitting: Adult Health

## 2018-02-24 ENCOUNTER — Ambulatory Visit (INDEPENDENT_AMBULATORY_CARE_PROVIDER_SITE_OTHER): Payer: Medicare Other | Admitting: Pulmonary Disease

## 2018-02-24 ENCOUNTER — Encounter: Payer: Self-pay | Admitting: Adult Health

## 2018-02-24 ENCOUNTER — Encounter: Payer: Self-pay | Admitting: Pulmonary Disease

## 2018-02-24 VITALS — BP 150/90 | HR 84 | Ht 63.0 in | Wt 242.0 lb

## 2018-02-24 VITALS — BP 136/82 | HR 70 | Temp 97.9°F | Ht 63.5 in | Wt 242.0 lb

## 2018-02-24 DIAGNOSIS — H35349 Macular cyst, hole, or pseudohole, unspecified eye: Secondary | ICD-10-CM | POA: Diagnosis not present

## 2018-02-24 DIAGNOSIS — E559 Vitamin D deficiency, unspecified: Secondary | ICD-10-CM | POA: Diagnosis not present

## 2018-02-24 DIAGNOSIS — R6889 Other general symptoms and signs: Secondary | ICD-10-CM | POA: Diagnosis not present

## 2018-02-24 DIAGNOSIS — R922 Inconclusive mammogram: Secondary | ICD-10-CM

## 2018-02-24 DIAGNOSIS — Z79899 Other long term (current) drug therapy: Secondary | ICD-10-CM | POA: Diagnosis not present

## 2018-02-24 DIAGNOSIS — R7303 Prediabetes: Secondary | ICD-10-CM

## 2018-02-24 DIAGNOSIS — H2510 Age-related nuclear cataract, unspecified eye: Secondary | ICD-10-CM

## 2018-02-24 DIAGNOSIS — K219 Gastro-esophageal reflux disease without esophagitis: Secondary | ICD-10-CM | POA: Diagnosis not present

## 2018-02-24 DIAGNOSIS — E785 Hyperlipidemia, unspecified: Secondary | ICD-10-CM | POA: Diagnosis not present

## 2018-02-24 DIAGNOSIS — R059 Cough, unspecified: Secondary | ICD-10-CM

## 2018-02-24 DIAGNOSIS — R05 Cough: Secondary | ICD-10-CM

## 2018-02-24 DIAGNOSIS — R0989 Other specified symptoms and signs involving the circulatory and respiratory systems: Secondary | ICD-10-CM

## 2018-02-24 DIAGNOSIS — R928 Other abnormal and inconclusive findings on diagnostic imaging of breast: Secondary | ICD-10-CM | POA: Diagnosis not present

## 2018-02-24 DIAGNOSIS — Z0001 Encounter for general adult medical examination with abnormal findings: Secondary | ICD-10-CM

## 2018-02-24 DIAGNOSIS — K7581 Nonalcoholic steatohepatitis (NASH): Secondary | ICD-10-CM

## 2018-02-24 DIAGNOSIS — Z0000 Encounter for general adult medical examination without abnormal findings: Secondary | ICD-10-CM

## 2018-02-24 DIAGNOSIS — Z1239 Encounter for other screening for malignant neoplasm of breast: Secondary | ICD-10-CM

## 2018-02-24 NOTE — Patient Instructions (Signed)
I am glad your breathing and cough is doing well I will follow back with you in 6 months.  Please give [place] call sooner if there is any change in symptoms.

## 2018-02-24 NOTE — Progress Notes (Signed)
Anita Blake    [id number]    1946-10-27  Primary Care Physician:Blake, Anita Saxon, MD  Referring Physician: Unk Pinto, MD [address] [place], [place] [address]  Chief complaint:   Follow up for  Upper airway cough Mild asthma  HPI: Mrs. Anita Blake is a 72 year old with past medical history of obesity, hyperlipidemia, GERD, and Anita Blake here for evaluation of dyspnea at rest and with exertion. She has non-productive cough with wheezing and chest tightness for the past 6-8 months. She is using the inhaler at home but does not know the name. She does not have any change in symptoms with that. She has history of GERD and diagnosed with Schatzki's ring in August 2017. There are no no seasonal allergies. She was evaluated by cardiology and had exercise stress test with no evidence of ischemia.  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  No dyspnea, cough, wheezing, sputum production, fevers, chills. Not on any inhalers.  Outpatient Encounter Medications as of 02/24/2018  Medication Sig  . aspirin 81 MG tablet Take 81 mg by mouth daily.  . Cholecalciferol (VITAMIN D PO) Take 5,000 Int'l Units by mouth daily.  Marland Kitchen LIVALO 4 MG TABS TAKE 1 TABLET BY MOUTH AT BEDTIME (Patient taking differently: TAKE 1/2 TABLET BY MOUTH AT BEDTIME)  . MAGNESIUM PO Take 250 mg by mouth.    No facility-administered encounter medications on file as of 02/24/2018.     Allergies as of 02/24/2018 - Review Complete 02/24/2018  Allergen Reaction Noted  . Anesthetics, halogenated  06/29/2014  . Crestor [rosuvastatin]  01/10/2014  . Lopid [gemfibrozil]  01/10/2014  . Latex Rash and Swelling 09/29/2013    Past Medical History:  Diagnosis Date  . Anemia yrs ago  . Arthritis   . Complication of anesthesia    slow to wake up  . GERD (gastroesophageal  reflux disease)   . Hyperlipidemia   . Labile hypertension    no meds  . Nonalcoholic steatohepatitis (NASH)   . Prediabetes   . Vitamin D deficiency     Past Surgical History:  Procedure Laterality Date  . ABDOMINAL HYSTERECTOMY    . cataract surgery Left   . EYE SURGERY Left 2007   macular hole in retina  . index finger surgery Right   . right arm elbow surgery     ligament repair  . TOTAL HIP ARTHROPLASTY Left 10/06/2013   Procedure: LEFT TOTAL HIP ARTHROPLASTY ANTERIOR APPROACH;  Surgeon: Anita Pole, MD;  Location: [facility];  Service: Orthopedics;  Laterality: Left;    Family History  Problem Relation Age of Onset  . Heart disease Mother        Vague  . Stroke Father   . Alzheimer's disease Other   . Heart attack Brother 76  . Heart attack Sister 59  . Breast cancer Sister   . Breast cancer Cousin     Social History   Socioeconomic History  . Marital status: Married    Spouse name: Not on file  . Number of children: 1  . Years of education: Not on file  . Highest education level: Not on file  Social Needs  . Financial resource strain: Not on file  . Food insecurity - worry: Not on file  . Food insecurity - inability: Not on file  . Transportation needs - medical:  Not on file  . Transportation needs - non-medical: Not on file  Occupational History  . Not on file  Tobacco Use  . Smoking status: Never Smoker  . Smokeless tobacco: Never Used  Substance and Sexual Activity  . Alcohol use: No  . Drug use: No  . Sexual activity: Not on file  Other Topics Concern  . Not on file  Social History Narrative   Lives with husband.      Review of systems: Review of Systems  Constitutional: Negative for fever and chills.  HENT: Negative.   Eyes: Negative for blurred vision.  Respiratory: as per HPI  Cardiovascular: Negative for chest pain and palpitations.  Gastrointestinal: Negative for vomiting, diarrhea, blood per rectum. Genitourinary: Negative for  dysuria, urgency, frequency and hematuria.  Musculoskeletal: Negative for myalgias, back pain and joint pain.  Skin: Negative for itching and rash.  Neurological: Negative for dizziness, tremors, focal weakness, seizures and loss of consciousness.  Endo/Heme/Allergies: Negative for environmental allergies.  Psychiatric/Behavioral: Negative for depression, suicidal ideas and hallucinations.  All other systems reviewed and are negative.  Physical Exam: Blood pressure (!) 150/90, pulse 84, height 5' 3"  (1.6 m), weight 242 lb (109.8 kg), SpO2 94 %. Gen:      No acute distress HEENT:  EOMI, sclera anicteric Neck:     No masses; no thyromegaly Lungs:    Clear to auscultation bilaterally; normal respiratory effort CV:         Regular rate and rhythm; no murmurs Abd:      + bowel sounds; soft, non-tender; no palpable masses, no distension Ext:    No edema; adequate peripheral perfusion Skin:      Warm and dry; no rash Neuro: alert and oriented x 3 Psych: normal mood and affect  Data Reviewed: FENO 06/18/17- < 5  Exercise stress test 05/23/16- mildly impaired exercise tolerance, no evidence of ischemia.  Chest x-ray 05/09/16-no acute cardiopulmonary abnormality.  Chest x-ray 06/18/17-no acute cardiopulmonary abnormality I have reviewed the images personally.  CBC differential 08/12/17-WBC 7.8, eos 2.3%, absolute eos count 230 Blood allergy profile 06/18/17-sensitive to tree pollen, cat, cockroach, dust mite. IgE 746  PFTs 08/14/17 FVC 1.41 (59%], FEV1 1.18 [64%), F/F 84, TLC 69%, DLCO 84%, DLCO/VA 142% Mild restriction. No obstruction. Increased diffusion capacity.  Assessment:  Follow-up for cough Suspect she has upper airway cough syndrome from postnasal drip, GERD.  Suspicion for asthma is low as PFTs do not show any obstruction and FENO is low Continue to observe off inhalers.  For postnasal drip she'll continue to use chlorpheniramine over-the-counter 8 mg 3 times daily and Flonase nasal  spray. And for the GERD she is on Zantac.  Plan/Recommendations: - Use chlorpheniramine 8 gm tid, Flonase nasal spray - Zantac for GERD.   Anita Garfinkel MD [facility] Pager [phone] 02/24/2018, 2:57 PM  CC: Unk Pinto, MD

## 2018-02-24 NOTE — Patient Instructions (Signed)
Prediabetes Eating Plan Prediabetes-also called impaired glucose tolerance or impaired fasting glucose-is a condition that causes blood sugar (blood glucose) levels to be higher than normal. Following a healthy diet can help to keep prediabetes under control. It can also help to lower the risk of type 2 diabetes and heart disease, which are increased in people who have prediabetes. Along with regular exercise, a healthy diet:  Promotes weight loss.  Helps to control blood sugar levels.  Helps to improve the way that the body uses insulin.  What do I need to know about this eating plan?  Use the glycemic index (GI) to plan your meals. The index tells you how quickly a food will raise your blood sugar. Choose low-GI foods. These foods take a longer time to raise blood sugar.  Pay close attention to the amount of carbohydrates in the food that you eat. Carbohydrates increase blood sugar levels.  Keep track of how many calories you take in. Eating the right amount of calories will help you to achieve a healthy weight. Losing about 7 percent of your starting weight can help to prevent type 2 diabetes.  You may want to follow a Mediterranean diet. This diet includes a lot of vegetables, lean meats or fish, whole grains, fruits, and healthy oils and fats. What foods can I eat? Grains Whole grains, such as whole-wheat or whole-grain breads, crackers, cereals, and pasta. Unsweetened oatmeal. Bulgur. Barley. Quinoa. Brown rice. Corn or whole-wheat flour tortillas or taco shells. Vegetables Lettuce. Spinach. Peas. Beets. Cauliflower. Cabbage. Broccoli. Carrots. Tomatoes. Squash. Eggplant. Herbs. Peppers. Onions. Cucumbers. Brussels sprouts. Fruits Berries. Bananas. Apples. Oranges. Grapes. Papaya. Mango. Pomegranate. Kiwi. Grapefruit. Cherries. Meats and Other Protein Sources Seafood. Lean meats, such as chicken and [place] or lean cuts of pork and beef. Tofu. Eggs. Nuts. Beans. Dairy Low-fat or  fat-free dairy products, such as yogurt, cottage cheese, and cheese. Beverages Water. Tea. Coffee. Sugar-free or diet soda. Seltzer water. Milk. Milk alternatives, such as soy or almond milk. Condiments Mustard. Relish. Low-fat, low-sugar ketchup. Low-fat, low-sugar barbecue sauce. Low-fat or fat-free mayonnaise. Sweets and Desserts Sugar-free or low-fat pudding. Sugar-free or low-fat ice cream and other frozen treats. Fats and Oils Avocado. Walnuts. Olive oil. The items listed above may not be a complete list of recommended foods or beverages. Contact your dietitian for more options. What foods are not recommended? Grains Refined white flour and flour products, such as bread, pasta, snack foods, and cereals. Beverages Sweetened drinks, such as sweet iced tea and soda. Sweets and Desserts Baked goods, such as cake, cupcakes, pastries, cookies, and cheesecake. The items listed above may not be a complete list of foods and beverages to avoid. Contact your dietitian for more information. This information is not intended to replace advice given to you by your health care provider. Make sure you discuss any questions you have with your health care provider. Document Released: 04/19/2015 Document Revised: 05/10/2016 Document Reviewed: 12/29/2014 Elsevier Interactive Patient Education  2017 [employer].

## 2018-02-25 LAB — CBC WITH DIFFERENTIAL/PLATELET
BASOS ABS: 50 {cells}/uL (ref 0–200)
Basophils Relative: 0.7 %
EOS ABS: 192 {cells}/uL (ref 15–500)
Eosinophils Relative: 2.7 %
HCT: 36.8 % (ref 35.0–45.0)
HEMOGLOBIN: 12 g/dL (ref 11.7–15.5)
Lymphs Abs: 2407 cells/uL (ref 850–3900)
MCH: 25.8 pg — AB (ref 27.0–33.0)
MCHC: 32.6 g/dL (ref 32.0–36.0)
MCV: 79 fL — ABNORMAL LOW (ref 80.0–100.0)
MONOS PCT: 6.9 %
MPV: 10.8 fL (ref 7.5–12.5)
Neutro Abs: 3962 cells/uL (ref 1500–7800)
Neutrophils Relative %: 55.8 %
PLATELETS: 324 10*3/uL (ref 140–400)
RBC: 4.66 10*6/uL (ref 3.80–5.10)
RDW: 14.7 % (ref 11.0–15.0)
TOTAL LYMPHOCYTE: 33.9 %
WBC mixed population: 490 cells/uL (ref 200–950)
WBC: 7.1 10*3/uL (ref 3.8–10.8)

## 2018-02-25 LAB — LIPID PANEL
Cholesterol: 171 mg/dL (ref <200)
HDL: 49 mg/dL — ABNORMAL LOW (ref >50)
LDL Cholesterol (Calc): 102 mg/dL (calc) — ABNORMAL HIGH
NON-HDL CHOLESTEROL (CALC): 122 mg/dL (ref <130)
Total CHOL/HDL Ratio: 3.5 (calc) (ref <5.0)
Triglycerides: 107 mg/dL (ref <150)

## 2018-02-25 LAB — HEPATIC FUNCTION PANEL
AG RATIO: 1.3 (calc) (ref 1.0–2.5)
ALKALINE PHOSPHATASE (APISO): 59 U/L (ref 33–130)
ALT: 24 U/L (ref 6–29)
AST: 29 U/L (ref 10–35)
Albumin: 4 g/dL (ref 3.6–5.1)
BILIRUBIN DIRECT: 0.1 mg/dL (ref 0.0–0.2)
BILIRUBIN INDIRECT: 0.5 mg/dL (ref 0.2–1.2)
Globulin: 3 g/dL (calc) (ref 1.9–3.7)
TOTAL PROTEIN: 7 g/dL (ref 6.1–8.1)
Total Bilirubin: 0.6 mg/dL (ref 0.2–1.2)

## 2018-02-25 LAB — BASIC METABOLIC PANEL WITH GFR
BUN: 15 mg/dL (ref 7–25)
CO2: 32 mmol/L (ref 20–32)
CREATININE: 0.65 mg/dL (ref 0.50–0.99)
Calcium: 9.6 mg/dL (ref 8.6–10.4)
Chloride: 104 mmol/L (ref 98–110)
GFR, Est African American: 105 mL/min/{1.73_m2} (ref >=60)
GFR, Est Non African American: 91 mL/min/{1.73_m2} (ref >=60)
Glucose, Bld: 98 mg/dL (ref 65–99)
Potassium: 4.6 mmol/L (ref 3.5–5.3)
SODIUM: 142 mmol/L (ref 135–146)

## 2018-02-25 LAB — HEMOGLOBIN A1C
EAG (MMOL/L): 7.1 (calc)
HEMOGLOBIN A1C: 6.1 %{Hb} — AB (ref <5.7)
Mean Plasma Glucose: 128 (calc)

## 2018-02-25 LAB — TSH: TSH: 2.24 m[IU]/L (ref 0.40–4.50)

## 2018-02-25 LAB — VITAMIN D 25 HYDROXY (VIT D DEFICIENCY, FRACTURES): Vit D, 25-Hydroxy: 67 ng/mL (ref 30–100)

## 2018-03-11 ENCOUNTER — Ambulatory Visit
Admission: RE | Admit: 2018-03-11 | Discharge: 2018-03-11 | Disposition: A | Discharge status: Home / Self Care | Payer: Medicare Other | Source: Ambulatory Visit | Attending: Adult Health | Admitting: Adult Health

## 2018-03-11 DIAGNOSIS — Z1239 Encounter for other screening for malignant neoplasm of breast: Secondary | ICD-10-CM

## 2018-03-11 DIAGNOSIS — R922 Inconclusive mammogram: Secondary | ICD-10-CM

## 2018-03-11 MED ORDER — GADOBENATE DIMEGLUMINE 529 MG/ML IV SOLN
20.0000 mL | Freq: Once | INTRAVENOUS | Status: AC | PRN
  Administered 2018-03-11: 20 mL via INTRAVENOUS

## 2018-04-09 LAB — HM DIABETES EYE EXAM

## 2018-04-29 ENCOUNTER — Encounter: Payer: Self-pay | Admitting: *Deleted

## 2018-05-20 ENCOUNTER — Encounter: Payer: Self-pay | Admitting: Physician Assistant

## 2018-05-26 NOTE — Progress Notes (Signed)
Complete Physical  Assessment and Plan:   Labile hypertension At goal off medications; continue to monitor Monitor blood pressure at home; call if consistently over 130/80 Continue DASH diet.   Reminder to go to the ER if any CP, SOB, nausea, dizziness, severe HA, changes vision/speech, left arm numbness and tingling and jaw pain.  Gastroesophageal reflux disease, esophagitis presence not specified Well managed on current medications Discussed diet, avoiding triggers and other lifestyle changes  NASH (nonalcoholic steatohepatitis) Weight loss advised -     Hepatic function panel  Prediabetes Discussed disease and risks Discussed diet/exercise, weight management  Dietary recommendations Physical Activity recommendations -     Hemoglobin A1c  Abnormal MRI, breast Due for 6 month follow up MRI; scheduled after discussion with patient  Age-related nuclear cataract, unspecified laterality Followed by ophthalmonology  Hyperlipidemia, unspecified hyperlipidemia type Continue statin medication Continue low cholesterol diet and exercise.  Check lipid panel.  -     Lipid panel -     TSH  Macular hole, unspecified laterality Followed by ophthalmology  Morbid obesity (BMI 42.79) Long discussion about weight loss, diet, and exercise Recommended diet heavy in fruits and veggies and low in animal meats, cheeses, and dairy products, appropriate calorie intake Discussed appropriate weight for height  Follow up at next visit  Vitamin D deficiency At goal at recent check; continue to recommend supplementation for goal of 70-100 Defer vitamin D level  Abdominal cramping Has upcoming appointment with Dr. Jaci Carrel Recommended food diary in the interim, FODMAP info discussed and provided Bentyl prescribed for comfort after discussion   Intermittent numbness of left anterolateral thigh Intermittent, x 1 month, recently had brain MRI, no lumbar/hip pain Discussed possible etiologies,  declines neuro referral at this time Weight loss advised   Orders Placed This Encounter  Procedures  . CBC with Differential/Platelet  . COMPLETE METABOLIC PANEL WITH GFR  . Magnesium  . Lipid panel  . TSH  . Hemoglobin A1c  . Vitamin B12  . Microalbumin / creatinine urine ratio  . Urinalysis w microscopic + reflex cultur  . EKG 12-Lead       Discussed med's effects and SE's. Screening labs and tests as requested with regular follow-up as recommended. Over 40 minutes of exam, counseling, chart review, and complex, high level critical decision making was performed this visit.   Future Appointments  Date Time Provider [place]  02/25/2019  9:30 AM Liane Comber, NP [facility] None  05/29/2019  9:30 AM Liane Comber, NP [facility] None     HPI  72 y.o. female  presents for a complete physical and follow up for has Morbid obesity (BMI 42.79); Labile hypertension; Hyperlipidemia; GERD (gastroesophageal reflux disease); Vitamin D deficiency; NASH (nonalcoholic steatohepatitis); Encounter for Medicare annual wellness exam; Medication management; Macular hole; Cataract; and Prediabetes on their problem list.   Patient reports new intermittent cramping, feels has been associated with meat consumption, improved with probiotic and with eating   She endorses continued sensation of imbalance, feels as though she may fall over forward with bending over  BMI is Body mass index is 41.39 kg/m., she has been working on diet and exercise. She is trying to get below 200 lb.  Wt Readings from Last 3 Encounters:  05/27/18 237 lb 6.4 oz (107.7 kg)  02/24/18 242 lb (109.8 kg)  02/24/18 242 lb (109.8 kg)   Her blood pressure has been controlled at home, today their BP is BP: 136/84 She does workout. She denies chest pain, shortness of  breath, dizziness.   She is on cholesterol medication and denies myalgias. Her cholesterol is at goal. The cholesterol last visit was:   Lab Results   Component Value Date   CHOL 171 02/24/2018   HDL 49 (L) 02/24/2018   LDLCALC 102 (H) 02/24/2018   TRIG 107 02/24/2018   CHOLHDL 3.5 02/24/2018    She has been working on diet and exercise for prediabetes, she is on bASA, she is not on ACE/ARB (BP controlled) and denies foot ulcerations, increased appetite, nausea, paresthesia of the feet, polydipsia, polyuria, visual disturbances, vomiting and weight loss. Last A1C in the office was:  Lab Results  Component Value Date   HGBA1C 6.1 (H) 02/24/2018   Last GFR: Lab Results  Component Value Date   GFRAA 105 02/24/2018   Patient is on Vitamin D supplement.   Lab Results  Component Value Date   VD25OH 67 02/24/2018      Current Medications:  Current Outpatient Medications on File Prior to Visit  Medication Sig Dispense Refill  . aspirin 81 MG tablet Take 81 mg by mouth daily.    . Cholecalciferol (VITAMIN D PO) Take 5,000 Int'l Units by mouth daily.    Marland Kitchen LIVALO 4 MG TABS TAKE 1 TABLET BY MOUTH AT BEDTIME (Patient taking differently: TAKE 1/2 TABLET BY MOUTH AT BEDTIME) 90 tablet 1  . MAGNESIUM PO Take 250 mg by mouth.      No current facility-administered medications on file prior to visit.    Allergies:  Allergies  Allergen Reactions  . Anesthetics, Halogenated     Difficulty with waking up from anesthesia from [facility], had aphasia, negative CT  . Crestor [Rosuvastatin]   . Lopid [Gemfibrozil]   . Latex Rash and Swelling    [Rash] redness/swelling/rash   Medical History:  She has Morbid obesity (BMI 42.79); Labile hypertension; Hyperlipidemia; GERD (gastroesophageal reflux disease); Vitamin D deficiency; NASH (nonalcoholic steatohepatitis); Encounter for Medicare annual wellness exam; Medication management; Macular hole; Cataract; and Prediabetes on their problem list. Health Maintenance:   Immunization History  Administered Date(s) Administered  . DT 05/10/2015  . Td 07/27/2005   Preventative care: Last colonoscopy:  negative cologuard 2016 Colonoscopy 2017 Dr. Earlean Shawl, normal  EGD 2017  Last mammogram: 05/2017 MRI breasts: 02/2018 Last pap smear/pelvic exam: 05/2018 - by Harle Battiest - sees annually DEXA:2002, has had since then in last 5 years - managed by OBGYN CT head 09/2013 MRI head: 12/2017 [place] AB 2012 US Soft tissues 2011 Stress test 05/2016  Prior vaccinations: TD or Tdap: 2016 Influenza: declines Pneumococcal: declines Prevnar13: declines Shingles/Zostavax: declines  Names of Other Physician/Practitioners you currently use: 1. [facility] here for primary care 2. Dr. Kathrin Penner, Dr. Renford Dills, eye doctor, wears glasses 04/09/2018 3. Dr. Randol Kern, Dr. Rayann Heman, dentist, last visit 05/2018 - sees q3 month  Patient Care Team: Unk Pinto, MD as PCP - General (Internal Medicine) Richmond Campbell, MD as Consulting Physician (Gastroenterology) Delila Pereyra, MD as Consulting Physician (Gynecology) Paralee Cancel, MD as Consulting Physician (Orthopedic Surgery)  Surgical History:  She has a past surgical history that includes Abdominal hysterectomy; Eye surgery (Left, 2007); cataract surgery (Left); right arm elbow surgery; index finger surgery (Right); and Total hip arthroplasty (Left, 10/06/2013). Family History:  Herfamily history includes Alzheimer's disease in her other; Breast cancer in her cousin; Heart disease in her mother; Stroke in her father. Social History:  She reports that she has never smoked. She has never used smokeless tobacco. She reports that  she does not drink alcohol or use drugs.  Review of Systems: Review of Systems  Constitutional: Negative for malaise/fatigue and weight loss.  HENT: Negative for hearing loss and tinnitus.   Eyes: Negative for blurred vision and double vision.  Respiratory: Negative for cough, sputum production, shortness of breath and wheezing.   Cardiovascular: Negative for chest pain, palpitations, orthopnea,  claudication, leg swelling and PND.  Gastrointestinal: Positive for abdominal pain (Intermittent cramping). Negative for blood in stool, constipation, diarrhea, heartburn, melena, nausea and vomiting.  Genitourinary: Negative.   Musculoskeletal: Negative for falls, joint pain and myalgias.  Skin: Negative for rash.  Neurological: Positive for sensory change (Intermittent numbness of anterolateral left thigh, small localized area of left medial dorsum, intermittent). Negative for dizziness, tingling, weakness and headaches.       Some imbalance with leaning forward, MRI normal  Endo/Heme/Allergies: Negative for polydipsia.  Psychiatric/Behavioral: Negative.  Negative for depression, memory loss, substance abuse and suicidal ideas. The patient is not nervous/anxious and does not have insomnia.   All other systems reviewed and are negative.   Physical Exam: Estimated body mass index is 41.39 kg/m as calculated from the following:   Height as of this encounter: 5' 3.5" (1.613 m).   Weight as of this encounter: 237 lb 6.4 oz (107.7 kg). BP 136/84   Pulse 76   Temp 97.6 F (36.4 C)   Resp 14   Ht 5' 3.5" (1.613 m)   Wt 237 lb 6.4 oz (107.7 kg)   SpO2 97%   BMI 41.39 kg/m  General Appearance: Well nourished, in no apparent distress.  Eyes: PERRLA, EOMs, conjunctiva no swelling or erythema, normal fundi and vessels.  Sinuses: No Frontal/maxillary tenderness  ENT/Mouth: Ext aud canals clear, normal light reflex with TMs without erythema, bulging. Good dentition. No erythema, swelling, or exudate on post pharynx. Tonsils not swollen or erythematous. Hearing normal.  Neck: Supple, thyroid normal. No bruits  Respiratory: Respiratory effort normal, BS equal bilaterally without rales, rhonchi, wheezing or stridor.  Cardio: RRR without murmurs, rubs or gallops. Brisk peripheral pulses without edema.  Chest: symmetric, with normal excursions and percussion.  Breasts: Defer Abdomen: Soft,  nontender, no guarding, rebound, hernias, masses, or organomegaly.  Lymphatics: Non tender without lymphadenopathy.  Genitourinary: Defer Musculoskeletal: Full ROM all peripheral extremities,5/5 strength, and normal gait.  Skin: Warm, dry without rashes, lesions, ecchymosis. Neuro: Cranial nerves intact, reflexes equal bilaterally. Normal muscle tone, no cerebellar symptoms. Sensation intact.  Psych: Awake and oriented X 3, normal affect, Insight and Judgment appropriate.   EKG: WNL no ST changes.  Gorden Harms Casy Brunetto 9:44 AM [facility]

## 2018-05-27 ENCOUNTER — Ambulatory Visit (INDEPENDENT_AMBULATORY_CARE_PROVIDER_SITE_OTHER): Payer: Medicare Other | Admitting: Adult Health

## 2018-05-27 ENCOUNTER — Encounter: Payer: Self-pay | Admitting: Adult Health

## 2018-05-27 VITALS — BP 136/84 | HR 76 | Temp 97.6°F | Resp 14 | Ht 63.5 in | Wt 237.4 lb

## 2018-05-27 DIAGNOSIS — H2510 Age-related nuclear cataract, unspecified eye: Secondary | ICD-10-CM

## 2018-05-27 DIAGNOSIS — R0989 Other specified symptoms and signs involving the circulatory and respiratory systems: Secondary | ICD-10-CM

## 2018-05-27 DIAGNOSIS — K7581 Nonalcoholic steatohepatitis (NASH): Secondary | ICD-10-CM

## 2018-05-27 DIAGNOSIS — E785 Hyperlipidemia, unspecified: Secondary | ICD-10-CM

## 2018-05-27 DIAGNOSIS — Z136 Encounter for screening for cardiovascular disorders: Secondary | ICD-10-CM | POA: Diagnosis not present

## 2018-05-27 DIAGNOSIS — Z0000 Encounter for general adult medical examination without abnormal findings: Secondary | ICD-10-CM

## 2018-05-27 DIAGNOSIS — H35349 Macular cyst, hole, or pseudohole, unspecified eye: Secondary | ICD-10-CM

## 2018-05-27 DIAGNOSIS — R7303 Prediabetes: Secondary | ICD-10-CM

## 2018-05-27 DIAGNOSIS — K219 Gastro-esophageal reflux disease without esophagitis: Secondary | ICD-10-CM

## 2018-05-27 DIAGNOSIS — Z79899 Other long term (current) drug therapy: Secondary | ICD-10-CM

## 2018-05-27 DIAGNOSIS — I10 Essential (primary) hypertension: Secondary | ICD-10-CM | POA: Diagnosis not present

## 2018-05-27 DIAGNOSIS — D649 Anemia, unspecified: Secondary | ICD-10-CM

## 2018-05-27 DIAGNOSIS — E559 Vitamin D deficiency, unspecified: Secondary | ICD-10-CM

## 2018-05-27 MED ORDER — DICYCLOMINE HCL 10 MG PO CAPS: ORAL_CAPSULE | ORAL | 1 refills | Status: DC

## 2018-05-27 NOTE — Patient Instructions (Addendum)
  Aim for 7+ servings of fruits and vegetables daily  80+ fluid ounces of water or unsweet tea for healthy kidneys  Limit alcohol intake  Limit animal fats in diet for cholesterol and heart health - choose grass fed whenever available  Aim for low stress - take time to unwind and care for your mental health  Aim for 150 min of moderate intensity exercise weekly for heart health, and weights twice weekly for bone health  Aim for 7-9 hours of sleep daily    Please go to the ER if you have any severe AB pain, unable to hold down food/water, blood in stool or vomit, chest pain, shortness of breath, or any worsening symptoms.   Consider keeping a food diary- common causes of diarrhea are dairy, certain carbs...  FODMAP stands for fermentable oligo-, di-, mono-saccharides and polyols (1). These are the scientific terms used to classify groups of carbs that are notorious for triggering digestive symptoms like bloating, gas and stomach pain.   FODMAPs are found in a wide range of foods in varying amounts. Some foods contain just one type, while others contain several.  The main dietary sources of the four groups of FODMAPs include:  Oligosaccharides: Wheat, rye, legumes and various fruits and vegetables, such as garlic and onions.  Disaccharides: Milk, yogurt and soft cheese. Lactose is the main carb.  Monosaccharides: Various fruit including figs and mangoes, and sweeteners such as honey and agave nectar. Fructose is the main carb.  Polyols: Certain fruits and vegetables including blackberries and lychee, as well as some low-calorie sweeteners like those in sugar-free gum.   Keep a food diary. This will help you identify foods that cause symptoms. Write down: ? What you eat and when. ? What symptoms you have. ? When symptoms occur in relation to your meals.  Avoid foods that cause symptoms. Talk with your dietitian about other ways to get the same nutrients that are in these  foods.  Eat your meals slowly, in a relaxed setting.  Aim to eat 5-6 small meals per day. Do not skip meals.  Drink enough fluids to keep your urine clear or pale yellow.  Ask your health care provider if you should take an over-the-counter probiotic during flare-ups to help restore healthy gut bacteria.  If you have cramping or diarrhea, try making your meals low in fat and high in carbohydrates. Examples of carbohydrates are pasta, rice, whole grain breads and cereals, fruits, and vegetables.  If dairy products cause your symptoms to flare up, try eating less of them. You might be able to handle yogurt better than other dairy products because it contains bacteria that help with digestion.

## 2018-05-28 LAB — COMPLETE METABOLIC PANEL WITH GFR
AG RATIO: 1.4 (calc) (ref 1.0–2.5)
ALT: 35 U/L — ABNORMAL HIGH (ref 6–29)
AST: 26 U/L (ref 10–35)
Albumin: 4.3 g/dL (ref 3.6–5.1)
Alkaline phosphatase (APISO): 67 U/L (ref 33–130)
BUN: 22 mg/dL (ref 7–25)
CALCIUM: 9.9 mg/dL (ref 8.6–10.4)
CO2: 32 mmol/L (ref 20–32)
CREATININE: 0.75 mg/dL (ref 0.50–0.99)
Chloride: 98 mmol/L (ref 98–110)
GFR, EST AFRICAN AMERICAN: 94 mL/min/{1.73_m2} (ref >=60)
GFR, EST NON AFRICAN AMERICAN: 81 mL/min/{1.73_m2} (ref >=60)
GLOBULIN: 3.1 g/dL (ref 1.9–3.7)
Glucose, Bld: 88 mg/dL (ref 65–99)
POTASSIUM: 4.6 mmol/L (ref 3.5–5.3)
Sodium: 140 mmol/L (ref 135–146)
TOTAL PROTEIN: 7.4 g/dL (ref 6.1–8.1)
Total Bilirubin: 0.7 mg/dL (ref 0.2–1.2)

## 2018-05-28 LAB — URINALYSIS W MICROSCOPIC + REFLEX CULTURE
BACTERIA UA: NONE SEEN /HPF
Bilirubin Urine: NEGATIVE
Glucose, UA: NEGATIVE
HGB URINE DIPSTICK: NEGATIVE
HYALINE CAST: NONE SEEN /LPF
Ketones, ur: NEGATIVE
LEUKOCYTE ESTERASE: NEGATIVE
NITRITES URINE, INITIAL: NEGATIVE
PH: 6.5 (ref 5.0–8.0)
PROTEIN: NEGATIVE
Specific Gravity, Urine: 1.021 (ref 1.001–1.03)

## 2018-05-28 LAB — CBC WITH DIFFERENTIAL/PLATELET
BASOS PCT: 0.5 %
Basophils Absolute: 50 cells/uL (ref 0–200)
EOS PCT: 3.2 %
Eosinophils Absolute: 320 cells/uL (ref 15–500)
HEMATOCRIT: 38.9 % (ref 35.0–45.0)
HEMOGLOBIN: 12.8 g/dL (ref 11.7–15.5)
LYMPHS ABS: 3900 {cells}/uL (ref 850–3900)
MCH: 25.9 pg — ABNORMAL LOW (ref 27.0–33.0)
MCHC: 32.9 g/dL (ref 32.0–36.0)
MCV: 78.6 fL — ABNORMAL LOW (ref 80.0–100.0)
MPV: 10.6 fL (ref 7.5–12.5)
Monocytes Relative: 6.4 %
NEUTROS ABS: 5090 {cells}/uL (ref 1500–7800)
Neutrophils Relative %: 50.9 %
Platelets: 341 10*3/uL (ref 140–400)
RBC: 4.95 10*6/uL (ref 3.80–5.10)
RDW: 13.9 % (ref 11.0–15.0)
Total Lymphocyte: 39 %
WBC: 10 10*3/uL (ref 3.8–10.8)
WBCMIX: 640 {cells}/uL (ref 200–950)

## 2018-05-28 LAB — MICROALBUMIN / CREATININE URINE RATIO
Creatinine, Urine: 134 mg/dL (ref 20–275)
MICROALB UR: 1 mg/dL
MICROALB/CREAT RATIO: 7 ug/mg{creat} (ref <30)

## 2018-05-28 LAB — VITAMIN B12: Vitamin B-12: 858 pg/mL (ref 200–1100)

## 2018-05-28 LAB — NO CULTURE INDICATED

## 2018-05-28 LAB — LIPID PANEL
CHOL/HDL RATIO: 2.8 (calc) (ref <5.0)
CHOLESTEROL: 204 mg/dL — AB (ref <200)
HDL: 73 mg/dL (ref >50)
LDL CHOLESTEROL (CALC): 106 mg/dL — AB
NON-HDL CHOLESTEROL (CALC): 131 mg/dL — AB (ref <130)
Triglycerides: 137 mg/dL (ref <150)

## 2018-05-28 LAB — TSH: TSH: 2.37 mIU/L (ref 0.40–4.50)

## 2018-05-28 LAB — HEMOGLOBIN A1C
EAG (MMOL/L): 7.1 (calc)
Hgb A1c MFr Bld: 6.1 % of total Hgb — ABNORMAL HIGH (ref <5.7)
MEAN PLASMA GLUCOSE: 128 (calc)

## 2018-05-28 LAB — MAGNESIUM: Magnesium: 2 mg/dL (ref 1.5–2.5)

## 2018-08-20 ENCOUNTER — Other Ambulatory Visit: Payer: Self-pay | Admitting: Physician Assistant

## 2018-08-20 ENCOUNTER — Telehealth: Payer: Self-pay

## 2018-08-20 DIAGNOSIS — Z1211 Encounter for screening for malignant neoplasm of colon: Secondary | ICD-10-CM

## 2018-08-20 NOTE — Telephone Encounter (Signed)
Last cologuard-negative-05/24/2015  Would like to repeat COLOGUARD.

## 2018-08-24 ENCOUNTER — Other Ambulatory Visit: Payer: Self-pay | Admitting: Internal Medicine

## 2018-08-29 ENCOUNTER — Encounter: Payer: Self-pay | Admitting: Internal Medicine

## 2018-08-29 ENCOUNTER — Ambulatory Visit: Payer: Medicare Other | Admitting: Internal Medicine

## 2018-08-29 VITALS — BP 142/90 | HR 72 | Temp 97.0°F | Resp 16 | Ht 63.5 in | Wt 236.2 lb

## 2018-08-29 NOTE — Progress Notes (Signed)
                                                                                                                                L  E  F  T           F  O  R    A  N  O  T  H  E  R  A  P  P  O  I  N  T  M  E  N  T                             This very nice 72 y.o.female presents for 3 month follow up with HTN, HLD, Pre-Diabetes and Vitamin D Deficiency.      Patient is treated for HTN & BP has been controlled at home. Today's BP: (!) 142/90. Patient has had no complaints of any cardiac type chest pain, palpitations, dyspnea / orthopnea / PND, dizziness, claudication, or dependent edema.     Hyperlipidemia is controlled with diet & meds. Patient denies myalgias or other med SE's. Last Lipids were not at goal: Lab Results  Component Value Date   CHOL 204 (H) 05/27/2018   HDL 73 05/27/2018   LDLCALC 106 (H) 05/27/2018   TRIG 137 05/27/2018   CHOLHDL 2.8 05/27/2018      Also, the patient has history of PreDiabetes and has had no symptoms of reactive hypoglycemia, diabetic polys, paresthesias or visual blurring.  Last A1c was  Lab Results  Component Value Date   HGBA1C 6.1 (H) 05/27/2018      Further, the patient also has history of Vitamin D Deficiency and supplements vitamin D without any suspected side-effects. Last vitamin D was at goal: Lab Results  Component Value Date   VD25OH 67 02/24/2018

## 2018-09-01 LAB — LIPID PANEL
CHOL/HDL RATIO: 3.3 (calc) (ref <5.0)
Cholesterol: 168 mg/dL (ref <200)
HDL: 51 mg/dL (ref >50)
LDL Cholesterol (Calc): 97 mg/dL (calc)
NON-HDL CHOLESTEROL (CALC): 117 mg/dL (ref <130)
Triglycerides: 102 mg/dL (ref <150)

## 2018-09-01 LAB — COMPLETE METABOLIC PANEL WITH GFR
AG Ratio: 1.4 (calc) (ref 1.0–2.5)
ALT: 22 U/L (ref 6–29)
AST: 27 U/L (ref 10–35)
Albumin: 4.3 g/dL (ref 3.6–5.1)
Alkaline phosphatase (APISO): 56 U/L (ref 33–130)
BUN: 16 mg/dL (ref 7–25)
CALCIUM: 10 mg/dL (ref 8.6–10.4)
CO2: 32 mmol/L (ref 20–32)
CREATININE: 0.77 mg/dL (ref 0.60–0.93)
Chloride: 102 mmol/L (ref 98–110)
GFR, EST NON AFRICAN AMERICAN: 78 mL/min/{1.73_m2} (ref >=60)
GFR, Est African American: 91 mL/min/{1.73_m2} (ref >=60)
Globulin: 3.1 g/dL (calc) (ref 1.9–3.7)
Glucose, Bld: 94 mg/dL (ref 65–99)
Potassium: 5 mmol/L (ref 3.5–5.3)
Sodium: 141 mmol/L (ref 135–146)
Total Bilirubin: 0.6 mg/dL (ref 0.2–1.2)
Total Protein: 7.4 g/dL (ref 6.1–8.1)

## 2018-09-01 LAB — CBC WITH DIFFERENTIAL/PLATELET
BASOS PCT: 0.6 %
Basophils Absolute: 42 cells/uL (ref 0–200)
EOS PCT: 2.6 %
Eosinophils Absolute: 182 cells/uL (ref 15–500)
HCT: 37.4 % (ref 35.0–45.0)
Hemoglobin: 12 g/dL (ref 11.7–15.5)
LYMPHS ABS: 3122 {cells}/uL (ref 850–3900)
MCH: 25.5 pg — ABNORMAL LOW (ref 27.0–33.0)
MCHC: 32.1 g/dL (ref 32.0–36.0)
MCV: 79.4 fL — ABNORMAL LOW (ref 80.0–100.0)
MPV: 10.8 fL (ref 7.5–12.5)
Monocytes Relative: 8 %
NEUTROS PCT: 44.2 %
Neutro Abs: 3094 cells/uL (ref 1500–7800)
PLATELETS: 314 10*3/uL (ref 140–400)
RBC: 4.71 10*6/uL (ref 3.80–5.10)
RDW: 13.8 % (ref 11.0–15.0)
Total Lymphocyte: 44.6 %
WBC mixed population: 560 cells/uL (ref 200–950)
WBC: 7 10*3/uL (ref 3.8–10.8)

## 2018-09-01 LAB — VITAMIN D 25 HYDROXY (VIT D DEFICIENCY, FRACTURES): Vit D, 25-Hydroxy: 77 ng/mL (ref 30–100)

## 2018-09-01 LAB — TSH: TSH: 1.79 m[IU]/L (ref 0.40–4.50)

## 2018-09-01 LAB — HEMOGLOBIN A1C
EAG (MMOL/L): 7 (calc)
Hgb A1c MFr Bld: 6 % of total Hgb — ABNORMAL HIGH (ref <5.7)
MEAN PLASMA GLUCOSE: 126 (calc)

## 2018-09-01 LAB — MAGNESIUM: MAGNESIUM: 1.7 mg/dL (ref 1.5–2.5)

## 2018-09-01 LAB — INSULIN, RANDOM: Insulin: 10.9 u[IU]/mL (ref 2.0–19.6)

## 2018-09-26 ENCOUNTER — Ambulatory Visit (INDEPENDENT_AMBULATORY_CARE_PROVIDER_SITE_OTHER): Payer: Medicare Other | Admitting: Internal Medicine

## 2018-09-26 ENCOUNTER — Encounter: Payer: Self-pay | Admitting: Internal Medicine

## 2018-09-26 VITALS — BP 160/84 | HR 72 | Temp 97.2°F | Resp 16 | Ht 63.5 in | Wt 237.6 lb

## 2018-09-26 DIAGNOSIS — Z79899 Other long term (current) drug therapy: Secondary | ICD-10-CM

## 2018-09-26 DIAGNOSIS — E119 Type 2 diabetes mellitus without complications: Secondary | ICD-10-CM | POA: Diagnosis not present

## 2018-09-26 DIAGNOSIS — E782 Mixed hyperlipidemia: Secondary | ICD-10-CM | POA: Diagnosis not present

## 2018-09-26 DIAGNOSIS — R0989 Other specified symptoms and signs involving the circulatory and respiratory systems: Secondary | ICD-10-CM | POA: Diagnosis not present

## 2018-09-26 DIAGNOSIS — E559 Vitamin D deficiency, unspecified: Secondary | ICD-10-CM

## 2018-09-26 DIAGNOSIS — Z6841 Body Mass Index (BMI) 40.0 and over, adult: Secondary | ICD-10-CM

## 2018-09-26 NOTE — Patient Instructions (Signed)

## 2018-09-26 NOTE — Progress Notes (Signed)
This very nice 72 y.o. MBF presents for 3 month follow up with HTN, HLD, Pre-Diabetes and Vitamin D Deficiency.      Patient is followed expectantly for labile HTN & BP has been controlled at home. Today's BP is not at goal at 160/84 , but she reports all BP's are normal. Patient has had no complaints of any cardiac type chest pain, palpitations, dyspnea / orthopnea / PND, dizziness, claudication, or dependent edema.     Hyperlipidemia is controlled with diet & Livelo. Patient denies myalgias or other med SE's. Last Lipids were at goal: Lab Results  Component Value Date   CHOL 168 08/29/2018   HDL 51 08/29/2018   LDLCALC 97 08/29/2018   TRIG 102 08/29/2018   CHOLHDL 3.3 08/29/2018      Also, the patient has history of  Morbid Obesity (BMI 41+) and  T2_NIDDM (A1c 6.5% in Apr 2015) and she is attempting control with diet. She denies symptoms of reactive hypoglycemia, diabetic polys, paresthesias or visual blurring.  Last A1c was not at goal: Lab Results  Component Value Date   HGBA1C 6.0 (H) 08/29/2018      Further, the patient also has history of Vitamin D Deficiency ("41"/2016) and supplements vitamin D without any suspected side-effects. Last vitamin D was at goal: Lab Results  Component Value Date   VD25OH 77 08/29/2018   Current Outpatient Medications on File Prior to Visit  Medication Sig  . aspirin 81 MG tablet Take 81 mg by mouth daily.  . Cholecalciferol (VITAMIN D PO) Take 5,000 Int'l Units by mouth daily.  Marland Kitchen LIVALO 4 MG TABS TAKE 1 TABLET BY MOUTH AT BEDTIME  . MAGNESIUM PO Take 250 mg by mouth.    No current facility-administered medications on file prior to visit.    Allergies  Allergen Reactions  . Anesthetics, Halogenated     Difficulty with waking up from anesthesia from [facility], had aphasia, negative CT  . Crestor [Rosuvastatin]   . Lopid [Gemfibrozil]   . Latex Rash and Swelling    [Rash] redness/swelling/rash   PMHx:   Past Medical History:  Diagnosis  Date  . Anemia yrs ago  . Arthritis   . Complication of anesthesia    slow to wake up  . GERD (gastroesophageal reflux disease)   . Hyperlipidemia   . Labile hypertension    no meds  . Nonalcoholic steatohepatitis (NASH)   . Prediabetes   . Vitamin D deficiency    Immunization History  Administered Date(s) Administered  . DT 05/10/2015  . Td 07/27/2005   Past Surgical History:  Procedure Laterality Date  . ABDOMINAL HYSTERECTOMY    . cataract surgery Left   . EYE SURGERY Left 2007   macular hole in retina  . index finger surgery Right   . right arm elbow surgery     ligament repair  . TOTAL HIP ARTHROPLASTY Left 10/06/2013   Procedure: LEFT TOTAL HIP ARTHROPLASTY ANTERIOR APPROACH;  Surgeon: Mauri Pole, MD;  Location: [facility];  Service: Orthopedics;  Laterality: Left;   FHx:    Reviewed / unchanged  SHx:    Reviewed / unchanged   Systems Review:  Constitutional: Denies fever, chills, wt changes, headaches, insomnia, fatigue, night sweats, change in appetite. Eyes: Denies redness, blurred vision, diplopia, discharge, itchy, watery eyes.  ENT: Denies discharge, congestion, post nasal drip, epistaxis, sore throat, earache, hearing loss, dental pain, tinnitus, vertigo, sinus pain, snoring.  CV: Denies chest pain, palpitations,  irregular heartbeat, syncope, dyspnea, diaphoresis, orthopnea, PND, claudication or edema. Respiratory: denies cough, dyspnea, DOE, pleurisy, hoarseness, laryngitis, wheezing.  Gastrointestinal: Denies dysphagia, odynophagia, heartburn, reflux, water brash, abdominal pain or cramps, nausea, vomiting, bloating, diarrhea, constipation, hematemesis, melena, hematochezia  or hemorrhoids. Genitourinary: Denies dysuria, frequency, urgency, nocturia, hesitancy, discharge, hematuria or flank pain. Musculoskeletal: Denies arthralgias, myalgias, stiffness, jt. swelling, pain, limping or strain/sprain.  Skin: Denies pruritus, rash, hives, warts, acne, eczema or  change in skin lesion(s). Neuro: No weakness, tremor, incoordination, spasms, paresthesia or pain. Psychiatric: Denies confusion, memory loss or sensory loss. Endo: Denies change in weight, skin or hair change.  Heme/Lymph: No excessive bleeding, bruising or enlarged lymph nodes.  Physical Exam  BP (!) 160/84   Pulse 72   Temp (!) 97.2 F (36.2 C)   Resp 16   Ht 5' 3.5" (1.613 m)   Wt 237 lb 9.6 oz (107.8 kg)   BMI 41.43 kg/m   Appears  well nourished, well groomed  and in no distress.  Eyes: PERRLA, EOMs, conjunctiva no swelling or erythema. Sinuses: No frontal/maxillary tenderness ENT/Mouth: EAC's clear, TM's nl w/o erythema, bulging. Nares clear w/o erythema, swelling, exudates. Oropharynx clear without erythema or exudates. Oral hygiene is good. Tongue normal, non obstructing. Hearing intact.  Neck: Supple. Thyroid not palpable. Car 2+/2+ without bruits, nodes or JVD. Chest: Respirations nl with BS clear & equal w/o rales, rhonchi, wheezing or stridor.  Cor: Heart sounds normal w/ regular rate and rhythm without sig. murmurs, gallops, clicks or rubs. Peripheral pulses normal and equal  without edema.  Abdomen: Soft & bowel sounds normal. Non-tender w/o guarding, rebound, hernias, masses or organomegaly.  Lymphatics: Unremarkable.  Musculoskeletal: Full ROM all peripheral extremities, joint stability, 5/5 strength and normal gait.  Skin: Warm, dry without exposed rashes, lesions or ecchymosis apparent.  Neuro: Cranial nerves intact, reflexes equal bilaterally. Sensory-motor testing grossly intact. Tendon reflexes grossly intact.  Pysch: Alert & oriented x 3.  Insight and judgement nl & appropriate. No ideations.  Assessment and Plan:  1. Labile hypertension  - Continue medication, monitor blood pressure at home.  - Continue DASH diet.  Reminder to go to the ER if any CP,  SOB, nausea, dizziness, severe HA, changes vision/speech.  2. Hyperlipidemia, mixed  - Continue  diet/meds, exercise,& lifestyle modifications.  - Continue monitor periodic cholesterol/liver & renal functions   3. Diabetes mellitus without complication ([place])  - Continue diet, exercise,  - lifestyle modifications.  - Monitor appropriate labs.  4. Vitamin D deficiency  - Continue supplementation.  5. Class 3 severe obesity due to excess calories  with serious comorbidity and body mass index (BMI)  of 40.0 to 44.9 in adult ([place])  6. Medication management  - recent labs were reviewed w/patient        Discussed  regular exercise, BP monitoring, weight control to achieve/maintain BMI less than 25 and discussed med and SE's. Recommended labs to assess and monitor clinical status with further disposition pending results of labs. Over 30 minutes of exam, counseling, chart review was performed.

## 2018-09-28 ENCOUNTER — Encounter: Payer: Self-pay | Admitting: Internal Medicine

## 2018-10-23 ENCOUNTER — Encounter: Payer: Self-pay | Admitting: Physician Assistant

## 2018-10-23 ENCOUNTER — Ambulatory Visit (INDEPENDENT_AMBULATORY_CARE_PROVIDER_SITE_OTHER): Payer: Medicare Other | Admitting: Physician Assistant

## 2018-10-23 ENCOUNTER — Other Ambulatory Visit: Payer: Self-pay | Admitting: Physician Assistant

## 2018-10-23 VITALS — BP 132/78 | HR 68 | Temp 98.1°F | Resp 16 | Ht 63.5 in | Wt 241.4 lb

## 2018-10-23 DIAGNOSIS — E611 Iron deficiency: Secondary | ICD-10-CM | POA: Diagnosis not present

## 2018-10-23 DIAGNOSIS — K2100 Gastro-esophageal reflux disease with esophagitis, without bleeding: Secondary | ICD-10-CM

## 2018-10-23 DIAGNOSIS — R5383 Other fatigue: Secondary | ICD-10-CM

## 2018-10-23 MED ORDER — FAMOTIDINE 40 MG PO TABS: 40.0000 mg | ORAL_TABLET | Freq: Two times a day (BID) | ORAL | 1 refills | Status: DC

## 2018-10-23 NOTE — Progress Notes (Signed)
Assessment and Plan:  Gastroesophageal reflux disease with esophagitis He denies constipation, blood/mucus in emesis, melena, hematochezia. Benign AB on exam. Denies NSAID use. Denies ETOH use.  Had recent egd showing hiatal hernia -     CBC with Differential/Platelet -     COMPLETE METABOLIC PANEL WITH GFR - discussed with patient that with large hiatal hernia, will likely need to remain on H2 or PPI, she will try PPI first - if any worsening symptoms will call the office or go to ER --     Discontinue: famotidine (PEPCID) 40 MG tablet; Take 1 tablet (40 mg total) by mouth 2 (two) times daily  Other fatigue Will check labs, rule out anemia, etc However patient wakes every 2 hours, has never had sleep study, declines at this time but will think about it -     CBC with Differential/Platelet -     COMPLETE METABOLIC PANEL WITH GFR -     Iron,Total/Total Iron Binding Cap -     Ferritin -     Folate RBC -     Ehrlichia antibody panel  Iron deficiency -     Iron,Total/Total Iron Binding Cap -     Ferritin -     Folate RBC  WILL DO CLOSE FOLLOW UP 1 MONTH  Future Appointments  Date Time Provider [place]  11/25/2018  3:30 PM [name], PA-C [facility] None  02/25/2019  9:30 AM Liane Comber, NP [facility] None  05/29/2019  9:30 AM Liane Comber, NP [facility] None      HPI 72 y.o. obese, AF with history of HTN, chol, NASH, preDM, anemia and GERD, hiatal hernia presents for GERD symptoms and fatigue.   She has pressure in her chest with burning in her esophagus, she has AB pain. It is worse at bed laying down at night, worse with food She can not eat meat for a year due to stomach pain. Not eating meat for a year.   He denies constipation, blood/mucus in emesis, melena, hematochezia. Denies NSAID use. Denies ETOH use.   CT scan AB 2006, EGD 08/06/2016 with Dr. Earlean Shawl that showed large hiatal hernia.   She complains of new daily fatigue, thought admits she  has it on and off. She will awake every 2 hours at night as long as she can remember, unknown if she snores.  She has had a stress test 2017, she has had PFTs 2018.   Last B12 was June 858 she takes nightly. Jan 2019 iron was low normal. Normal TSH.  Lab Results  Component Value Date   TSH 1.79 08/29/2018   Lab Results  Component Value Date   IRON 69 12/19/2017   TIBC 372 12/19/2017   FERRITIN 172 12/19/2017     Past Medical History:  Diagnosis Date  . Anemia yrs ago  . Arthritis   . Complication of anesthesia    slow to wake up  . GERD (gastroesophageal reflux disease)   . Hyperlipidemia   . Labile hypertension    no meds  . Nonalcoholic steatohepatitis (NASH)   . Prediabetes   . Vitamin D deficiency      Allergies  Allergen Reactions  . Anesthetics, Halogenated     Difficulty with waking up from anesthesia from [facility], had aphasia, negative CT  . Crestor [Rosuvastatin]   . Lopid [Gemfibrozil]   . Latex Rash and Swelling    [Rash] redness/swelling/rash    Current Outpatient Medications on File Prior to Visit  Medication Sig  .  aspirin 81 MG tablet Take 81 mg by mouth daily.  . Cholecalciferol (VITAMIN D PO) Take 5,000 Int'l Units by mouth daily.  Marland Kitchen LIVALO 4 MG TABS TAKE 1 TABLET BY MOUTH AT BEDTIME  . MAGNESIUM PO Take 250 mg by mouth.    No current facility-administered medications on file prior to visit.     ROS: all negative except above.   Physical Exam: Filed Weights   10/23/18 1441  Weight: 241 lb 6.4 oz (109.5 kg)   BP 132/78   Pulse 68   Temp 98.1 F (36.7 C)   Resp 16   Ht 5' 3.5" (1.613 m)   Wt 241 lb 6.4 oz (109.5 kg)   SpO2 99%   BMI 42.09 kg/m  General Appearance: Well nourished, in no apparent distress.  Eyes: PERRLA, EOMs, conjunctiva no swelling or erythema, normal fundi and vessels.  Sinuses: No Frontal/maxillary tenderness  ENT/Mouth: Ext aud canals clear, normal light reflex with TMs without erythema, bulging. Good dentition. No  erythema, swelling, or exudate on post pharynx. Tonsils not swollen or erythematous. Hearing normal. Crowded mouth.  Neck: Supple, thyroid normal. No bruits  Respiratory: Respiratory effort normal, BS equal bilaterally without rales, rhonchi, wheezing or stridor.  Cardio: RRR without murmurs, rubs or gallops. Brisk peripheral pulses without edema.  Chest: symmetric, with normal excursions and percussion.  Breasts: Defer Abdomen: Soft, mild epigastric tenderness, no guarding, rebound, hernias, masses, or organomegaly.  Lymphatics: Non tender without lymphadenopathy.  Genitourinary: Defer Musculoskeletal: Full ROM all peripheral extremities,5/5 strength, and normal gait.  Skin: Warm, dry without rashes, lesions, ecchymosis. Neuro: Cranial nerves intact, reflexes equal bilaterally. Normal muscle tone, no cerebellar symptoms. Sensation intact.  Psych: Awake and oriented X 3, normal affect, Insight and Judgment appropriate.   [name], PA-C 3:04 PM [facility]

## 2018-10-23 NOTE — Patient Instructions (Signed)
Ways to prevent diarrhea with magnesium:  1) Don't take all your magnesium at the same time, have 2-3 smaller doses through out the day 2) Try taking your magnesium with high fiber meals.  3) If this does not help, take the magnesium on an empty stomach. Fiber for some people can bind the magnesium too well and prevent absorption in your gut.  4) Lastly try different types of magnesium. Most people are taking magnesium citrate, you can also try dimalate capsules which are slow release. You can also find magnesium lotions/sprays for the skin that bypass the gut. Another one that has good absorption is ReMag (pico-iconic magnesium formula), this has great cellular absorption so less of a laxative effect. You can find these type at health food stores or online.     Take omeprazole over the counter for 2 weeks, then go to zantac 150-300 mg OR pepcid 20 or 12m at night for 2 weeks, then you can stop or continue as needed.  Avoid alcohol, spicy foods, NSAIDS (aleve, ibuprofen) at this time. See foods below.   Food Choices for Gastroesophageal Reflux Disease When you have gastroesophageal reflux disease (GERD), the foods you eat and your eating habits are very important. Choosing the right foods can help ease the discomfort of GERD. WHAT GENERAL GUIDELINES DO I NEED TO FOLLOW?  Choose fruits, vegetables, whole grains, low-fat dairy products, and low-fat meat, fish, and poultry.  Limit fats such as oils, salad dressings, butter, nuts, and avocado.  Keep a food diary to identify foods that cause symptoms.  Avoid foods that cause reflux. These may be different for different people.  Eat frequent small meals instead of three large meals each day.  Eat your meals slowly, in a relaxed setting.  Limit fried foods.  Cook foods using methods other than frying.  Avoid drinking alcohol.  Avoid drinking large amounts of liquids with your meals.  Avoid bending over or lying down until 2-3 hours after  eating. WHAT FOODS ARE NOT RECOMMENDED? The following are some foods and drinks that may worsen your symptoms: Vegetables Tomatoes. Tomato juice. Tomato and spaghetti sauce. Chili peppers. Onion and garlic. Horseradish. Fruits Oranges, grapefruit, and lemon (fruit and juice). Meats High-fat meats, fish, and poultry. This includes hot dogs, ribs, ham, sausage, salami, and bacon. Dairy Whole milk and chocolate milk. Sour cream. Cream. Butter. Ice cream. Cream cheese.  Beverages Coffee and tea, with or without caffeine. Carbonated beverages or energy drinks. Condiments Hot sauce. Barbecue sauce.  Sweets/Desserts Chocolate and cocoa. Donuts. Peppermint and spearmint. Fats and Oils High-fat foods, including [place] and potato chips. Other Vinegar. Strong spices, such as black pepper, white pepper, red pepper, cayenne, curry powder, cloves, ginger, and chili powder.   Hiatal Hernia A hiatal hernia occurs when part of the stomach slides above the muscle that separates the abdomen from the chest (diaphragm). A person can be born with a hiatal hernia (congenital), or it may develop over time. In almost all cases of hiatal hernia, only the top part of the stomach pushes through the diaphragm. Many people have a hiatal hernia with no symptoms. The larger the hernia, the more likely it is that you will have symptoms. In some cases, a hiatal hernia allows stomach acid to flow back into the tube that carries food from your mouth to your stomach (esophagus). This may cause heartburn symptoms. Severe heartburn symptoms may mean that you have developed a condition called gastroesophageal reflux disease (GERD). What are the causes?  This condition is caused by a weakness in the opening (hiatus) where the esophagus passes through the diaphragm to attach to the upper part of the stomach. A person may be born with a weakness in the hiatus, or a weakness can develop over time. What increases the  risk? This condition is more likely to develop in:  Older people. Age is a major risk factor for a hiatal hernia, especially if you are over the age of 43.  Pregnant women.  People who are overweight.  People who have frequent constipation.  What are the signs or symptoms? Symptoms of this condition usually develop in the form of GERD symptoms. Symptoms include:  Heartburn.  Belching.  Indigestion.  Trouble swallowing.  Coughing or wheezing.  Sore throat.  Hoarseness.  Chest pain.  Nausea and vomiting.  How is this diagnosed? This condition may be diagnosed during testing for GERD. Tests that may be done include:  X-rays of your stomach or chest.  An upper gastrointestinal (GI) series. This is an X-ray exam of your GI tract that is taken after you swallow a chalky liquid that shows up clearly on the X-ray.  Endoscopy. This is a procedure to look into your stomach using a thin, flexible tube that has a tiny camera and light on the end of it.  How is this treated? This condition may be treated by:  Dietary and lifestyle changes to help reduce GERD symptoms.  Medicines. These may include: ? Over-the-counter antacids. ? Medicines that make your stomach empty more quickly. ? Medicines that block the production of stomach acid (H2 blockers). ? Stronger medicines to reduce stomach acid (proton pump inhibitors).  Surgery to repair the hernia, if other treatments are not helping.  If you have no symptoms, you may not need treatment. Follow these instructions at home: Lifestyle and activity  Do not use any products that contain nicotine or tobacco, such as cigarettes and e-cigarettes. If you need help quitting, ask your health care provider.  Try to achieve and maintain a healthy body weight.  Avoid putting pressure on your abdomen. Anything that puts pressure on your abdomen increases the amount of acid that may be pushed up into your esophagus. ? Avoid bending  over, especially after eating. ? Raise the head of your bed by putting blocks under the legs. This keeps your head and esophagus higher than your stomach. ? Do not wear tight clothing around your chest or stomach. ? Try not to strain when having a bowel movement, when urinating, or when lifting heavy objects. Eating and drinking  Avoid foods that can worsen GERD symptoms. These may include: ? Fatty foods, like fried foods. ? Citrus fruits, like oranges or lemon. ? Other foods and drinks that contain acid, like orange juice or tomatoes. ? Spicy food. ? Chocolate.  Eat frequent small meals instead of three large meals a day. This helps prevent your stomach from getting too full. ? Eat slowly. ? Do not lie down right after eating. ? Do not eat 1-2 hours before bed.  Do not drink beverages with caffeine. These include cola, coffee, cocoa, and tea.  Do not drink alcohol. General instructions  Take over-the-counter and prescription medicines only as told by your health care provider.  Keep all follow-up visits as told by your health care provider. This is important. Contact a health care provider if:  Your symptoms are not controlled with medicines or lifestyle changes.  You are having trouble swallowing.  You have coughing  or wheezing that will not go away. Get help right away if:  Your pain is getting worse.  Your pain spreads to your arms, neck, jaw, teeth, or back.  You have shortness of breath.  You sweat for no reason.  You feel sick to your stomach (nauseous) or you vomit.  You vomit blood.  You have bright red blood in your stools.  You have black, tarry stools. This information is not intended to replace advice given to you by your health care provider. Make sure you discuss any questions you have with your health care provider. Document Released: 02/23/2004 Document Revised: 11/26/2016 Document Reviewed: 11/26/2016 Elsevier Interactive Patient Education  [employer].    I think it is possible that you have sleep apnea. It can cause interrupted sleep, headaches, frequent awakenings, fatigue, dry mouth, fast/slow heart beats, memory issues, anxiety/depression, swelling, numbness tingling hands/feet, weight gain, shortness of breath, and the list goes on. Sleep apnea needs to be ruled out because if it is left untreated it does eventually lead to abnormal heart beats, lung failure or heart failure as well as increasing the risk of heart attack and stroke. There are masks you can wear OR a mouth piece that I can give you information about. Often times though people feel MUCH better after getting treatment.   Sleep Apnea  Sleep apnea is a sleep disorder characterized by abnormal pauses in breathing while you sleep. When your breathing pauses, the level of oxygen in your blood decreases. This causes you to move out of deep sleep and into light sleep. As a result, your quality of sleep is poor, and the system that carries your blood throughout your body (cardiovascular system) experiences stress. If sleep apnea remains untreated, the following conditions can develop:  High blood pressure (hypertension).  Coronary artery disease.  Inability to achieve or maintain an erection (impotence).  Impairment of your thought process (cognitive dysfunction). There are three types of sleep apnea: 1. Obstructive sleep apnea--Pauses in breathing during sleep because of a blocked airway. 2. Central sleep apnea--Pauses in breathing during sleep because the area of the brain that controls your breathing does not send the correct signals to the muscles that control breathing. 3. Mixed sleep apnea--A combination of both obstructive and central sleep apnea.  RISK FACTORS The following risk factors can increase your risk of developing sleep apnea:  Being overweight.  Smoking.  Having narrow passages in your nose and throat.  Being of older age.  Being female.   Alcohol use.  Sedative and tranquilizer use.  Ethnicity. Among individuals younger than 35 years, African Americans are at increased risk of sleep apnea. SYMPTOMS   Difficulty staying asleep.  Daytime sleepiness and fatigue.  Loss of energy.  Irritability.  Loud, heavy snoring.  Morning headaches.  Trouble concentrating.  Forgetfulness.  Decreased interest in sex. DIAGNOSIS  In order to diagnose sleep apnea, your caregiver will perform a physical examination. Your caregiver may suggest that you take a home sleep test. Your caregiver may also recommend that you spend the night in a sleep lab. In the sleep lab, several monitors record information about your heart, lungs, and brain while you sleep. Your leg and arm movements and blood oxygen level are also recorded. TREATMENT The following actions may help to resolve mild sleep apnea:  Sleeping on your side.   Using a decongestant if you have nasal congestion.   Avoiding the use of depressants, including alcohol, sedatives, and narcotics.   Losing  weight and modifying your diet if you are overweight. There also are devices and treatments to help open your airway:  Oral appliances. These are custom-made mouthpieces that shift your lower jaw forward and slightly open your bite. This opens your airway.  Devices that create positive airway pressure. This positive pressure "splints" your airway open to help you breathe better during sleep. The following devices create positive airway pressure:  Continuous positive airway pressure (CPAP) device. The CPAP device creates a continuous level of air pressure with an air pump. The air is delivered to your airway through a mask while you sleep. This continuous pressure keeps your airway open.  Nasal expiratory positive airway pressure (EPAP) device. The EPAP device creates positive air pressure as you exhale. The device consists of single-use valves, which are inserted into each  nostril and held in place by adhesive. The valves create very little resistance when you inhale but create much more resistance when you exhale. That increased resistance creates the positive airway pressure. This positive pressure while you exhale keeps your airway open, making it easier to breath when you inhale again.  Bilevel positive airway pressure (BPAP) device. The BPAP device is used mainly in patients with central sleep apnea. This device is similar to the CPAP device because it also uses an air pump to deliver continuous air pressure through a mask. However, with the BPAP machine, the pressure is set at two different levels. The pressure when you exhale is lower than the pressure when you inhale.  Surgery. Typically, surgery is only done if you cannot comply with less invasive treatments or if the less invasive treatments do not improve your condition. Surgery involves removing excess tissue in your airway to create a wider passage way. Document Released: 11/23/2002 Document Revised: 03/30/2013 Document Reviewed: 04/10/2012 [facility] Patient Information 2015 [place], [place]. This information is not intended to replace advice given to you by your health care provider. Make sure you discuss any questions you have with your health care provider.

## 2018-10-25 LAB — CBC WITH DIFFERENTIAL/PLATELET
BASOS ABS: 40 {cells}/uL (ref 0–200)
Basophils Relative: 0.5 %
EOS PCT: 2.5 %
Eosinophils Absolute: 200 cells/uL (ref 15–500)
HCT: 37.9 % (ref 35.0–45.0)
Hemoglobin: 11.7 g/dL (ref 11.7–15.5)
Lymphs Abs: 3280 cells/uL (ref 850–3900)
MCH: 24.8 pg — ABNORMAL LOW (ref 27.0–33.0)
MCHC: 30.9 g/dL — AB (ref 32.0–36.0)
MCV: 80.5 fL (ref 80.0–100.0)
MONOS PCT: 7.1 %
MPV: 10.3 fL (ref 7.5–12.5)
NEUTROS PCT: 48.9 %
Neutro Abs: 3912 cells/uL (ref 1500–7800)
PLATELETS: 292 10*3/uL (ref 140–400)
RBC: 4.71 10*6/uL (ref 3.80–5.10)
RDW: 13.7 % (ref 11.0–15.0)
TOTAL LYMPHOCYTE: 41 %
WBC mixed population: 568 cells/uL (ref 200–950)
WBC: 8 10*3/uL (ref 3.8–10.8)

## 2018-10-25 LAB — COMPLETE METABOLIC PANEL WITH GFR
AG RATIO: 1.4 (calc) (ref 1.0–2.5)
ALT: 46 U/L — ABNORMAL HIGH (ref 6–29)
AST: 31 U/L (ref 10–35)
Albumin: 4.2 g/dL (ref 3.6–5.1)
Alkaline phosphatase (APISO): 66 U/L (ref 33–130)
BILIRUBIN TOTAL: 0.4 mg/dL (ref 0.2–1.2)
BUN: 14 mg/dL (ref 7–25)
CALCIUM: 9.7 mg/dL (ref 8.6–10.4)
CHLORIDE: 102 mmol/L (ref 98–110)
CO2: 34 mmol/L — ABNORMAL HIGH (ref 20–32)
Creat: 0.6 mg/dL (ref 0.60–0.93)
GFR, Est African American: 107 mL/min/{1.73_m2} (ref >=60)
GFR, Est Non African American: 92 mL/min/{1.73_m2} (ref >=60)
GLUCOSE: 70 mg/dL (ref 65–99)
Globulin: 2.9 g/dL (calc) (ref 1.9–3.7)
POTASSIUM: 4.8 mmol/L (ref 3.5–5.3)
Sodium: 142 mmol/L (ref 135–146)
Total Protein: 7.1 g/dL (ref 6.1–8.1)

## 2018-10-25 LAB — EHRLICHIA ANTIBODY PANEL: E. CHAFFEENSIS AB IGM: <1:20 {titer}

## 2018-10-25 LAB — FERRITIN: Ferritin: 138 ng/mL (ref 16–288)

## 2018-10-25 LAB — FOLATE RBC: RBC Folate: 483 ng/mL RBC (ref >280)

## 2018-10-25 LAB — IRON, TOTAL/TOTAL IRON BINDING CAP
%SAT: 15 % — AB (ref 16–45)
Iron: 54 ug/dL (ref 45–160)
TIBC: 349 ug/dL (ref 250–450)

## 2018-11-07 IMAGING — MR MR BILATERAL BREAST WITHOUT AND WITH CONTRAST
8 of 12 series · 29 of 48 positions shown · IV contrast (20 ml Multihance)
Comparison: Mammography May 23, 2017

ADDENDUM:
The patient came in with questions regarding her recent breast MRI
and the recommendation for bilateral mass biopsies. During our
conversation, it was noted the reported history of family history
and increased risk of breast cancer was incorrect. The patient has
no family history of breast cancer or increased risk. The MRI was
done due to dense breasts. As a result, it would be reasonable to
cancel the biopsy, perform a second-look bilateral breast
ultrasound, and perform a subsequent six-month follow-up MRI if the
ultrasound is negative.

BI-RADS category 3-probably benign
CLINICAL DATA: Family history of breast cancer
LABS:  Creatinine 0.7.  BUN 32.  GFR 102.
EXAM:
BILATERAL BREAST MRI WITH AND WITHOUT CONTRAST
TECHNIQUE: Multiplanar, multisequence MR images of both breasts were obtained
prior to and following the intravenous administration of 20 ml of
MultiHance.

[Series 2: t2_tirm_tra ipat (a-p) · axial · 3.0mm · 0.70mm/px · 1 of 70 slices shown]
[im 1/70]
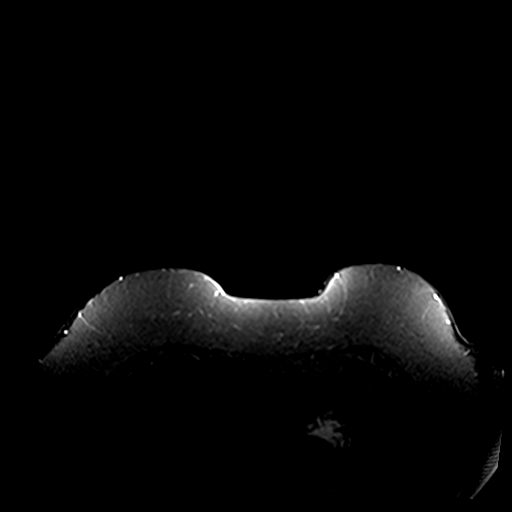

[Series 3: fl3d pre-cm no · axial · non-contrast · 1.0mm · 0.94mm/px · z∈[-85,+106]mm · 5 of 192 slices shown]
[im 1/192]
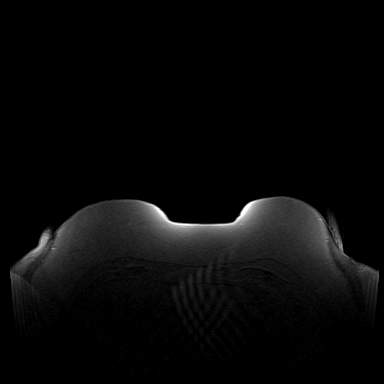
[im 48/192]
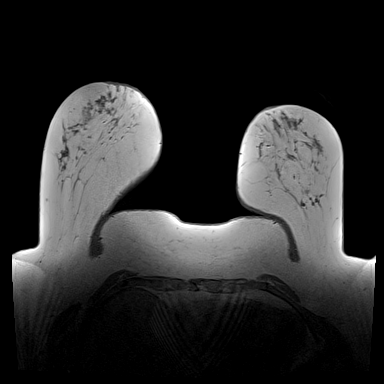
[im 96/192]
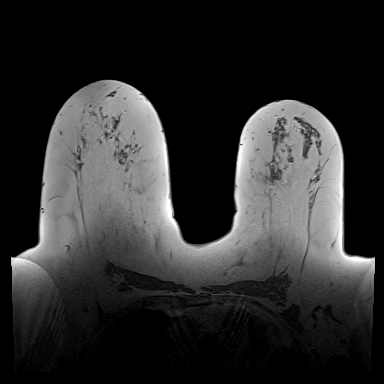
[im 144/192]
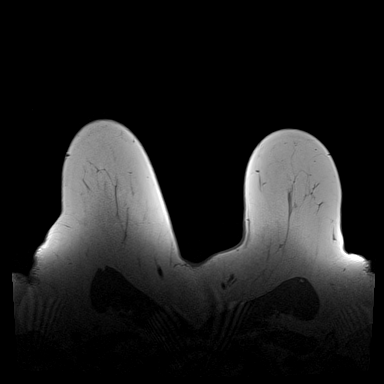
[im 192/192]
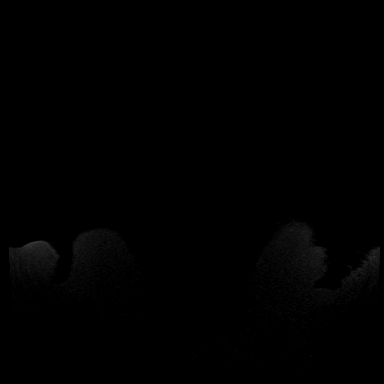

[Series 4: fl3d pre-cm · axial · non-contrast · 1.0mm · 0.87mm/px · z∈[-85,+106]mm · 5 of 192 slices shown]
[im 1/192]
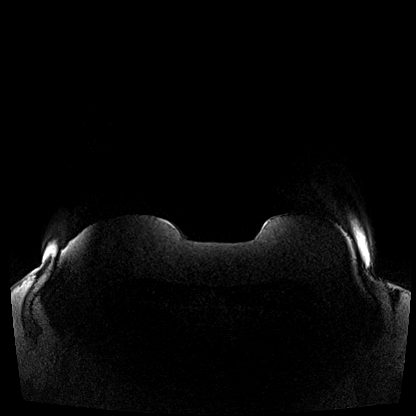
[im 48/192]
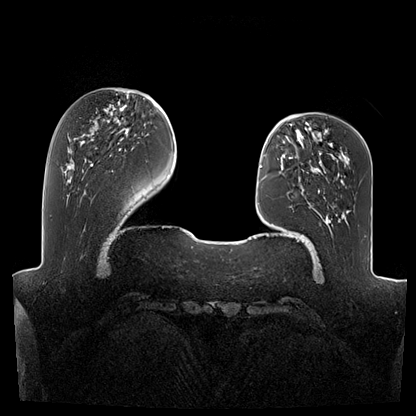
[im 96/192]
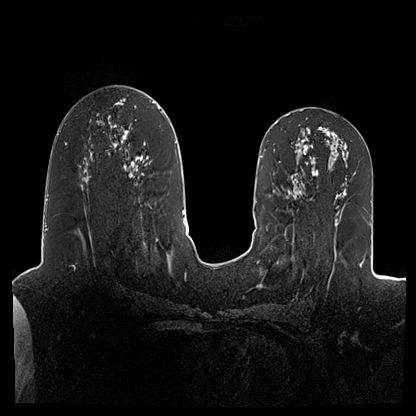
[im 144/192]
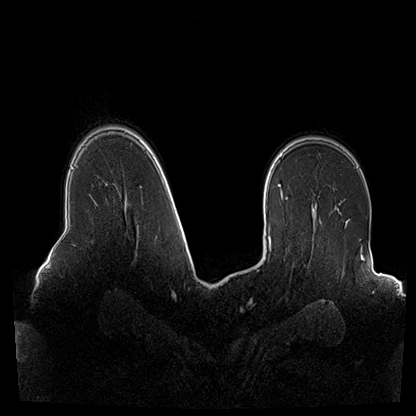
[im 192/192]
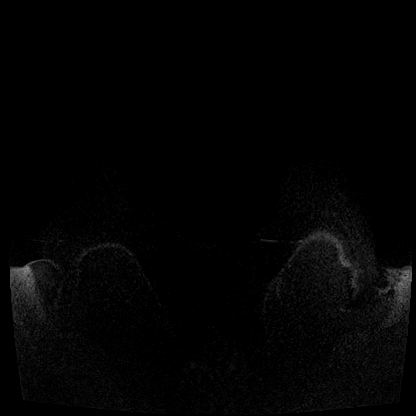

[Series 5: fl3d post-cm 20 · axial · 1.0mm · 0.87mm/px · z∈[-85,+106]mm · 5 of 192 slices shown (1 of 3)]
[im 1/192]
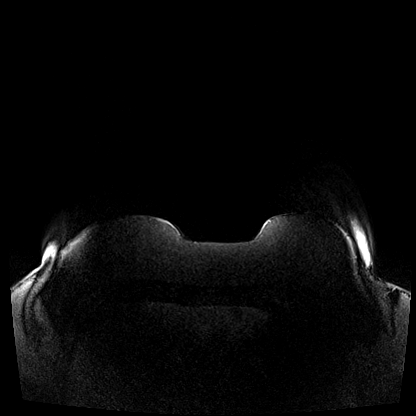
[im 48/192]
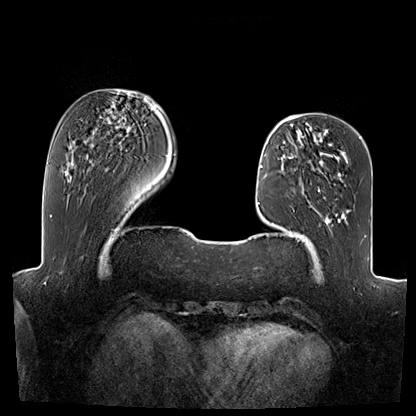
[im 96/192]
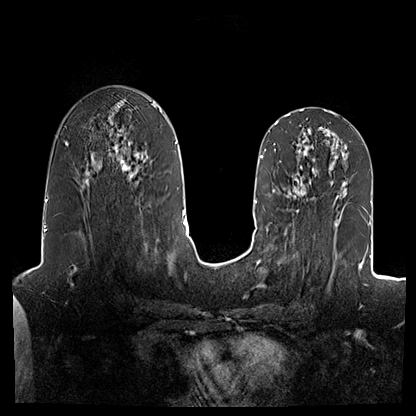
[im 144/192]
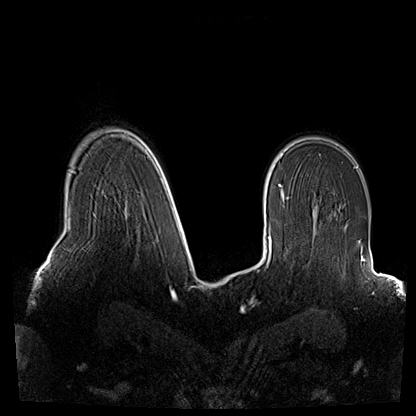
[im 192/192]
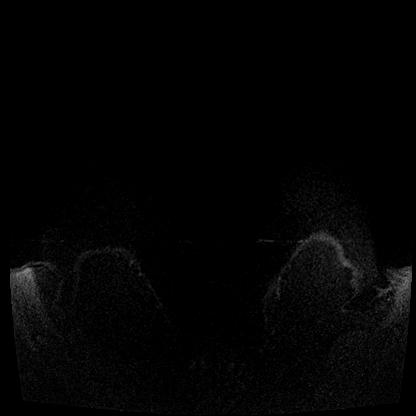

[Series 6: fl3d post-cm 20 · axial · 1.0mm · 0.87mm/px · z∈[-85,+106]mm · 5 of 192 slices shown (2 of 3)]
[im 1/192]
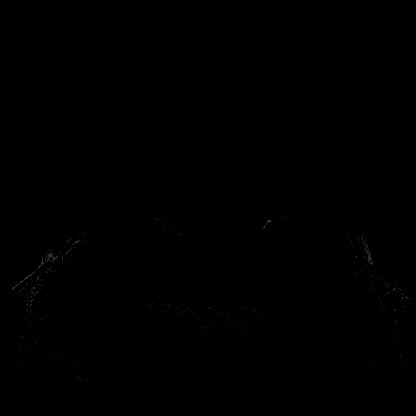
[im 48/192]
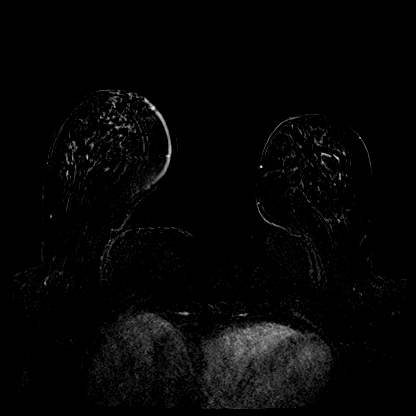
[im 96/192]
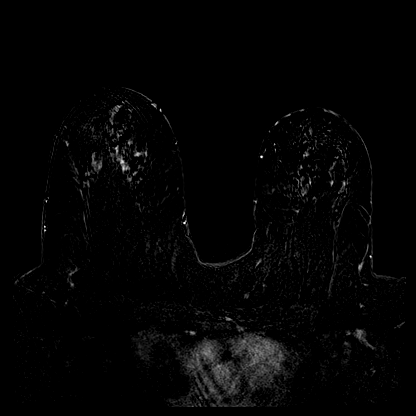
[im 144/192]
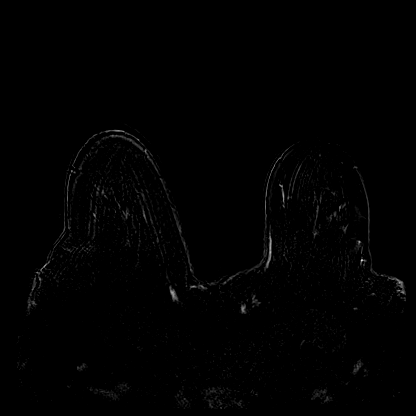
[im 192/192]
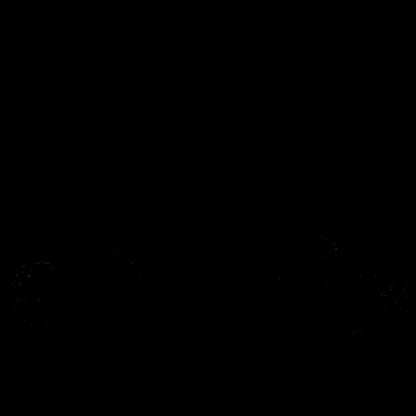

[Series 7: fl3d post-cm 20 · axial · 192.0mm · 0.87mm/px · 1 of 1 slices shown (3 of 3)]
[im 1/1]
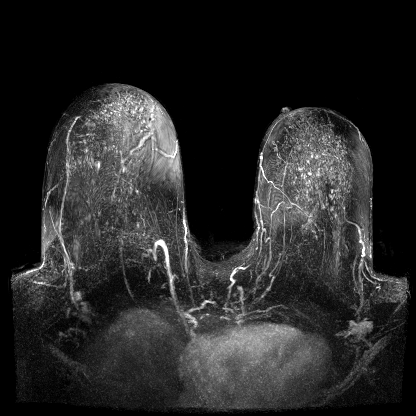

[Series 8: fl3d post-cm 3min · axial · 1.0mm · 0.87mm/px · z∈[-85,+106]mm · 6 of 192 slices shown]
[im 1/192]
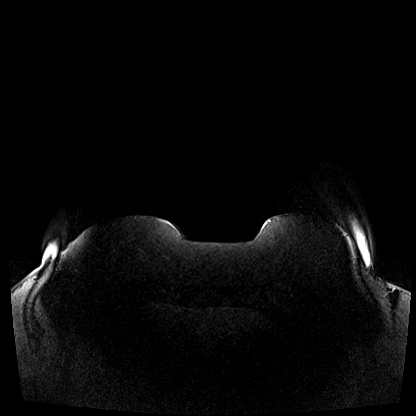
[im 39/192]
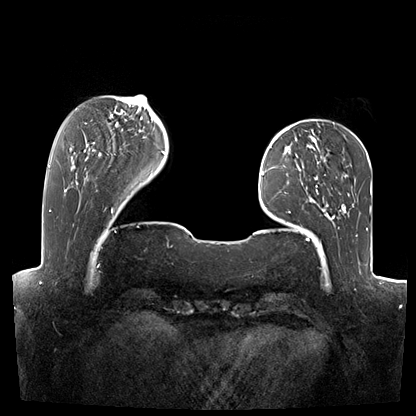
[im 77/192]
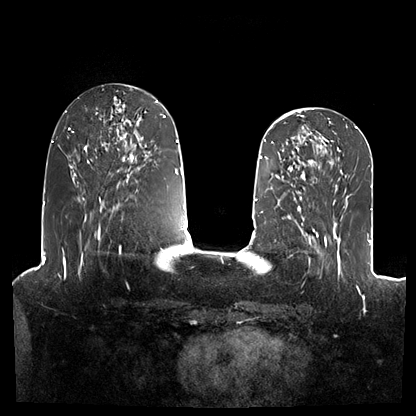
[im 115/192]
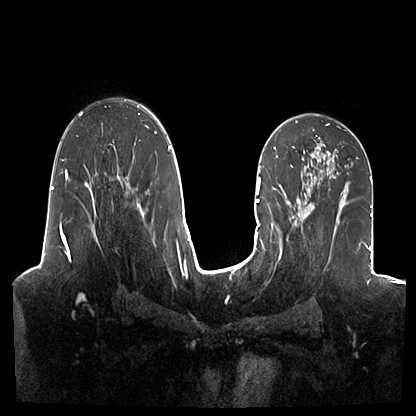
[im 153/192]
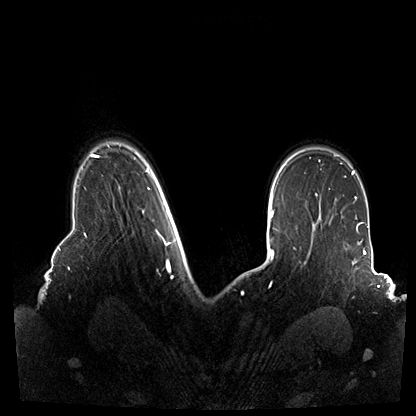
[im 192/192]
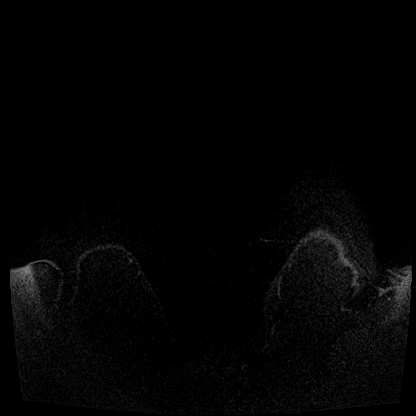

[Series 9: fl3d post-cm 3min_sub · axial · 1.0mm · 0.87mm/px · 1 of 192 slices shown]
[im 1/192]
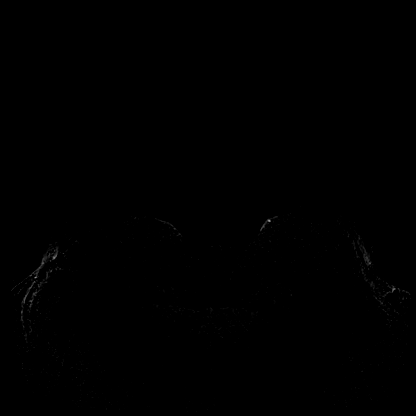

[29 of 48 positions shown; findings below may reference images not displayed]

THREE-DIMENSIONAL MR IMAGE RENDERING ON INDEPENDENT WORKSTATION:

Three-dimensional MR images were rendered by post-processing of the
original MR data on an independent workstation. The
three-dimensional MR images were interpreted, and findings are
reported in the following complete MRI report for this study. Three
dimensional images were evaluated at the independent [employer]
workstation
FINDINGS: Breast composition: b. Scattered fibroglandular tissue.

Background parenchymal enhancement: Moderate.

Right breast: There is a mass in the right breast best seen on
series 9, image 100 measuring 8.2 by 2.7 mm with high T2 signal.
There appear to be washout kinetics but this is thought to be due to
motion. No other abnormalities on the right.

Left breast: There is a mass in the left breast on series 9, image
107 measuring 6 mm. Persistent and plateau kinetics are identified.
No other suspicious abnormalities in the left breast.

Lymph nodes: No abnormal appearing lymph nodes.

Ancillary findings:  None.
IMPRESSION: There is a single mass in each breast.

RECOMMENDATION:
Recommend MRI guided biopsy of the bilateral breast masses given the
patient's high risk status. The level suspicion is relatively low.

BI-RADS CATEGORY  4: Suspicious.

## 2018-11-25 ENCOUNTER — Encounter: Payer: Self-pay | Admitting: Physician Assistant

## 2018-11-25 ENCOUNTER — Ambulatory Visit (INDEPENDENT_AMBULATORY_CARE_PROVIDER_SITE_OTHER): Payer: Medicare Other | Admitting: Physician Assistant

## 2018-11-25 VITALS — BP 136/88 | HR 78 | Temp 98.2°F | Ht 63.5 in | Wt 237.2 lb

## 2018-11-25 DIAGNOSIS — K2100 Gastro-esophageal reflux disease with esophagitis, without bleeding: Secondary | ICD-10-CM

## 2018-11-25 DIAGNOSIS — R5383 Other fatigue: Secondary | ICD-10-CM | POA: Diagnosis not present

## 2018-11-25 DIAGNOSIS — G4733 Obstructive sleep apnea (adult) (pediatric): Secondary | ICD-10-CM

## 2018-11-25 DIAGNOSIS — E611 Iron deficiency: Secondary | ICD-10-CM

## 2018-11-25 DIAGNOSIS — Z6841 Body Mass Index (BMI) 40.0 and over, adult: Secondary | ICD-10-CM

## 2018-11-25 NOTE — Progress Notes (Signed)
Assessment and Plan:  Gastroesophageal reflux disease with esophagitis Continue pepcid daily - may need follow up GI  Other fatigue Continue iron Will refer to sleep apnea, fatigue, frequent awakening, snoring.   Iron deficiency Continue iron every other day, recheck next OV  Future Appointments  Date Time Provider [place]  11/25/2018  3:30 PM [name], PA-C [facility] None  02/25/2019  9:30 AM Liane Comber, NP [facility] None  05/29/2019  9:30 AM Liane Comber, NP [facility] None      HPI 72 y.o. obese, AF with history of HTN, chol, NASH, preDM, anemia and GERD, hiatal hernia presents for follow up for GERD symptoms and fatigue.   She was started on PPI, she has been taking pepcid as needed for GERD symptoms and it has been helping. She will start to take it daily due to hiatal hernia.   . Labs were checked and every was negative but her CO2 was slightly elevated and her iron was low normal.  Her fatigue is better with iron every other day. She read over the sleep apnea information and does have several of the symptoms. She wakes up every 2 hours, have abnormal dreams, wakes up tired, husband states she snores at night and very bad.  She has had a stress test 2017, she has had PFTs 2018.  Lab Results  Component Value Date   IRON 54 10/23/2018   TIBC 349 10/23/2018   FERRITIN 138 10/23/2018   Lab Results  Component Value Date   CREATININE 0.60 10/23/2018   BUN 14 10/23/2018   NA 142 10/23/2018   K 4.8 10/23/2018   CL 102 10/23/2018   CO2 34 (H) 10/23/2018    Last B12 was June 858 she takes nightly. Jan 2019 iron was low normal. Normal TSH.  Lab Results  Component Value Date   TSH 1.79 08/29/2018     Past Medical History:  Diagnosis Date  . Anemia yrs ago  . Arthritis   . Complication of anesthesia    slow to wake up  . GERD (gastroesophageal reflux disease)   . Hyperlipidemia   . Labile hypertension    no meds  . Nonalcoholic  steatohepatitis (NASH)   . Prediabetes   . Vitamin D deficiency      Allergies  Allergen Reactions  . Anesthetics, Halogenated     Difficulty with waking up from anesthesia from [facility], had aphasia, negative CT  . Crestor [Rosuvastatin]   . Lopid [Gemfibrozil]   . Latex Rash and Swelling    [Rash] redness/swelling/rash    Current Outpatient Medications on File Prior to Visit  Medication Sig  . aspirin 81 MG tablet Take 81 mg by mouth daily.  . Cholecalciferol (VITAMIN D PO) Take 5,000 Int'l Units by mouth daily.  . famotidine (PEPCID) 40 MG tablet TAKE 1 TABLET(40 MG) BY MOUTH TWICE DAILY  . LIVALO 4 MG TABS TAKE 1 TABLET BY MOUTH AT BEDTIME  . MAGNESIUM PO Take 250 mg by mouth.    No current facility-administered medications on file prior to visit.     ROS: all negative except above.   Physical Exam: There were no vitals filed for this visit. There were no vitals taken for this visit. General Appearance: Well nourished, in no apparent distress.  Eyes: PERRLA, EOMs, conjunctiva no swelling or erythema, normal fundi and vessels.  Sinuses: No Frontal/maxillary tenderness  ENT/Mouth: Ext aud canals clear, normal light reflex with TMs without erythema, bulging. Good dentition. No erythema, swelling, or  exudate on post pharynx. Tonsils not swollen or erythematous. Hearing normal. Crowded mouth.  Neck: Supple, thyroid normal. No bruits  Respiratory: Respiratory effort normal, BS equal bilaterally without rales, rhonchi, wheezing or stridor.  Cardio: RRR without murmurs, rubs or gallops. Brisk peripheral pulses without edema.  Chest: symmetric, with normal excursions and percussion.  Breasts: Defer Abdomen: Soft, mild epigastric tenderness, no guarding, rebound, hernias, masses, or organomegaly.  Lymphatics: Non tender without lymphadenopathy.  Genitourinary: Defer Musculoskeletal: Full ROM all peripheral extremities,5/5 strength, and normal gait.  Skin: Warm, dry without rashes,  lesions, ecchymosis. Neuro: Cranial nerves intact, reflexes equal bilaterally. Normal muscle tone, no cerebellar symptoms. Sensation intact.  Psych: Awake and oriented X 3, normal affect, Insight and Judgment appropriate.   [name], PA-C 12:22 PM [facility]

## 2018-11-25 NOTE — Patient Instructions (Signed)
Know what a healthy weight is for you (roughly BMI <25) and aim to maintain this  Aim for 7+ servings of fruits and vegetables daily  65-80+ fluid ounces of water or unsweet tea for healthy kidneys  Limit to max 1 drink of alcohol per day; avoid smoking/tobacco  Limit animal fats in diet for cholesterol and heart health - choose grass fed whenever available  Avoid highly processed foods, and foods high in saturated/trans fats  Aim for low stress - take time to unwind and care for your mental health  Aim for 150 min of moderate intensity exercise weekly for heart health, and weights twice weekly for bone health  Aim for 7-9 hours of sleep daily

## 2019-01-19 ENCOUNTER — Ambulatory Visit (INDEPENDENT_AMBULATORY_CARE_PROVIDER_SITE_OTHER): Payer: Medicare Other | Admitting: Neurology

## 2019-01-19 ENCOUNTER — Encounter: Payer: Self-pay | Admitting: Neurology

## 2019-01-19 VITALS — BP 164/96 | HR 70 | Ht 63.5 in | Wt 238.0 lb

## 2019-01-19 DIAGNOSIS — E785 Hyperlipidemia, unspecified: Secondary | ICD-10-CM | POA: Diagnosis not present

## 2019-01-19 DIAGNOSIS — R0989 Other specified symptoms and signs involving the circulatory and respiratory systems: Secondary | ICD-10-CM | POA: Diagnosis not present

## 2019-01-19 DIAGNOSIS — K219 Gastro-esophageal reflux disease without esophagitis: Secondary | ICD-10-CM

## 2019-01-19 DIAGNOSIS — R0683 Snoring: Secondary | ICD-10-CM | POA: Diagnosis not present

## 2019-01-19 DIAGNOSIS — K7581 Nonalcoholic steatohepatitis (NASH): Secondary | ICD-10-CM

## 2019-01-19 DIAGNOSIS — R7303 Prediabetes: Secondary | ICD-10-CM

## 2019-01-19 NOTE — Progress Notes (Signed)
S[facility]EP [facility]INE [facility]IC   Provider:  Larey Seat, M D  Primary Care Physician:  Unk Pinto, MD   Referring Provider: Unk Pinto, MD    Chief Complaint  Patient presents with  . New Patient (Initial Visit)    pt alone, rm 10. pt states that all her life she has slept in 2 hr increments. she has been told she snores in sleep. pt has never had a sleep study. pt complaints of fatigue during the day. she states that she always felt like she pushes herself to stay awake. PCP sent her because laley the energy level had been severely decreased. recently started iron supplement due to low iron and she has felt that has gotten better.     HPI:  Anita Blake is a 73 y.o. female patient, seen on 01-19-2019 in a referral from Dr. Melford Aase for evaluation of fragmented sleep, daytime fatigue and sleepiness, in the setting of anemia and possibly apnea.   Chief complaint according to patient :pt states that all her life she has slept in 2 hr increments. she has been told she snores in sleep. pt has never had a sleep study. pt complaints of fatigue during the day. she states that she always felt like she pushes herself to stay awake. PCP sent her because laley the energy level had been severely decreased. recently started iron supplement due to low iron and she has felt that has gotten better.  Sleep habits are as follows: The patient adheres to a bedtime of 11.30 PM, and goes to sleep within 30 minutes - She describes her marital bedroom as cool, quiet and dark. She sleeps on her sides, but often changes position - she has an adjustable bed and sleeps on 1 or 2 pillows.  Her husband has noted her to snore. She reports very vivid dreams, has one nocturia and will continue her last dream. Seems to dream every night. She also has hiatal hernia GERD causing some sleep interruption. No headaches, she wakes up every 2 hours , looks at the clock. She wakes  at 4. 14 and 6. 31 AM , she rises between  7.31 and 8 .20 AM. She never used an alarm!  " I Have never overslept" .   Sleep medical history : hip replacement and knee problem have caused her to sleep more in supine or avoiding the left side. Obesity at BMI 41. Snoring has been present for longer than a decade.  NASH. Anemia with borderline iron levels.   Family medical/ sleep history: mother died at 57 of ESRD, father at age 10 of CVA>   Social history: married, never smoked, none drinker, caffeine - none - no tea, coffee, soda.  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 Snoring, fatigue, anemia, HTN, EDS- fragmente sleep , vivid dreams, dreams but takes no naps. No cataplexy.    Epworth Sleepiness Score : 2/ 24 points  , Fatigue Severity Score at 15/ 63   , depression score:    Social History   Socioeconomic History  . Marital status: Married    Spouse name: Not on file  . Number of children: 1  . Years of education: Not on file  . Highest education level: Not on  file  Occupational History  . Occupation: Retired  Scientific laboratory technician  . Financial resource strain: Not on file  . Food insecurity:    Worry: Not on file    Inability: Not on file  . Transportation needs:    Medical: Not on file    Non-medical: Not on file  Tobacco Use  . Smoking status: Never Smoker  . Smokeless tobacco: Never Used  Substance and Sexual Activity  . Alcohol use: No  . Drug use: No  . Sexual activity: Not Currently  Lifestyle  . Physical activity:    Days per week: 7 days    Minutes per session: 60 min  . Stress: Only a little  Relationships  . Social connections:    Talks on phone: Not on file    Gets together: Not on file    Attends religious service: Not on file    Active  member of club or organization: Not on file    Attends meetings of clubs or organizations: Not on file    Relationship status: Not on file  . Intimate partner violence:    Fear of current or ex partner: Not on file    Emotionally abused: Not on file    Physically abused: Not on file    Forced sexual activity: Not on file  Other Topics Concern  . Not on file  Social History Narrative   Lives with husband.      Family History  Problem Relation Age of Onset  . Heart disease Mother        Vague  . Kidney failure Mother   . Stroke Father   . Alzheimer's disease Other   . Breast cancer Cousin     Past Medical History:  Diagnosis Date  . Anemia yrs ago  . Arthritis   . Complication of anesthesia    slow to wake up  . GERD (gastroesophageal reflux disease)   . HLD (hyperlipidemia)   . Hyperlipidemia   . Labile hypertension    no meds  . Nonalcoholic steatohepatitis (NASH)   . Prediabetes   . Vitamin D deficiency     Past Surgical History:  Procedure Laterality Date  . ABDOMINAL HYSTERECTOMY    . cataract surgery Left   . EYE SURGERY Left 2007   macular hole in retina  . index finger surgery Right   . right arm elbow surgery     ligament repair  . TOTAL HIP ARTHROPLASTY Left 10/06/2013   Procedure: LEFT TOTAL HIP ARTHROPLASTY ANTERIOR APPROACH;  Surgeon: Mauri Pole, MD;  Location: [facility];  Service: Orthopedics;  Laterality: Left;    Current Outpatient Medications  Medication Sig Dispense Refill  . aspirin 81 MG tablet Take 81 mg by mouth daily.    . Cholecalciferol (VITAMIN D PO) Take 10,000 Int'l Units by mouth daily.     . famotidine (PEPCID) 40 MG tablet TAKE 1 TABLET(40 MG) BY MOUTH TWICE DAILY 180 tablet 1  . ferrous sulfate 325 (65 FE) MG EC tablet Take 325 mg by mouth daily with breakfast.    . LIVALO 4 MG TABS TAKE 1 TABLET BY MOUTH AT BEDTIME 90 tablet 0  . MAGNESIUM PO Take 500 mg by mouth.      No current facility-administered medications for this  visit.     Allergies as of 01/19/2019 - Review Complete 01/19/2019  Allergen Reaction Noted  . Anesthetics, halogenated  06/29/2014  . Crestor [rosuvastatin]  01/10/2014  . Lopid [gemfibrozil]  01/10/2014  .  Latex Rash and Swelling 09/29/2013    Vitals: BP (!) 164/96   Pulse 70   Ht 5' 3.5" (1.613 m)   Wt 238 lb (108 kg)   BMI 41.50 kg/m  Last Weight:  Wt Readings from Last 1 Encounters:  01/19/19 238 lb (108 kg)   RJJ:OACZ mass index is 41.5 kg/m.     Last Height:   Ht Readings from Last 1 Encounters:  01/19/19 5' 3.5" (1.613 m)    Physical exam:  General: The patient is awake, alert and appears not in acute distress. The patient is well groomed. Head: Normocephalic, atraumatic. Neck is supple. Mallampati: 4,  neck circumference:16" . Nasal airflow patent ,  Retrognathia is not seen.  Cardiovascular:  Regular rate and rhythm , without  murmurs or carotid bruit, and without distended neck veins. Respiratory: Lungs are clear to auscultation. Skin:  Without evidence of edema, or rash Trunk: BMI is 41. The patient's posture is erect, with a slight neck hump-   Neurologic exam : The patient is awake and alert, oriented to place and time.   Cranial nerves: Pupils are equal and briskly reactive to light and accomodation. Patient is wearing reading glasses. . Funduscopic exam with macular- retinal  tear in left eye- cataract removed left eye. no evidence of pallor or edema.   Extraocular movements in vertical and horizontal planes intact and without nystagmus. Visual fields by finger perimetry are intact. Hearing to finger rub intact.   Facial sensation intact to fine touch.  Facial motor strength is symmetric and tongue and uvula move midline. Shoulder shrug was symmetrical.   Motor exam:  Normal tone, muscle bulk and symmetric strength in all extremities. Sensory:  Fine touch, pinprick and vibration were tested in all extremities. Proprioception tested in the upper  extremities was normal. Coordination: good penmanship. Finger-to-nose maneuver  normal without evidence of ataxia, dysmetria or tremor. Gait and station: Patient walks without assistive device and is able unassisted to climb up to the exam table. Strength within normal limits. Stance is stable and normal.  Deep tendon reflexes: in the  upper and lower extremities are symmetric and intact. Babinski maneuver response is downgoing.   Assessment:  After physical and neurologic examination, review of laboratory studies,  Personal review of imaging studies, reports of other /same  Imaging studies, results of polysomnography and / or neurophysiology testing and pre-existing records as far as provided in visit., my assessment is:   1) I will order an expanded EEG PSG- given her vivid dreams and propensity to dream " all night". Sleep fragmentation - question about what wakes her.  2) Tired- fatigued - which has improved with iron intake- still is snoring , needs screening for OSA.  3) obesity, larger neck size and upper airway restriction are known risk factors or OSA>    The patient was advised of the nature of the diagnosed disorder , the treatment options and the  risks for general health and wellness arising from not treating the condition.   I spent more than 40 minutes of face to face time with the patient.  Greater than 50% of time was spent in counseling and coordination of care. We have discussed the diagnosis and differential and I answered the patient's questions.    Plan:  Treatment plan and additional workup :  Expanded EEG PSG, SPLIT at AHI 20, if possible.  Please score RERAS.   Patient requested a memory test, MOCA , at next visit.    Nashay Brickley  Evelisse Szalkowski, MD 4/0/3754, 36:06 AM  Certified in Neurology by ABPN Certified in Normal by [facility] [address], [place], [place] [address]

## 2019-02-06 ENCOUNTER — Ambulatory Visit: Payer: Medicare Other | Admitting: Internal Medicine

## 2019-02-06 VITALS — BP 142/80 | HR 56 | Temp 98.1°F | Resp 18 | Ht 63.5 in | Wt 236.0 lb

## 2019-02-06 DIAGNOSIS — R002 Palpitations: Secondary | ICD-10-CM

## 2019-02-06 DIAGNOSIS — K5792 Diverticulitis of intestine, part unspecified, without perforation or abscess without bleeding: Secondary | ICD-10-CM

## 2019-02-06 DIAGNOSIS — R0989 Other specified symptoms and signs involving the circulatory and respiratory systems: Secondary | ICD-10-CM

## 2019-02-06 DIAGNOSIS — R079 Chest pain, unspecified: Secondary | ICD-10-CM

## 2019-02-06 DIAGNOSIS — Z136 Encounter for screening for cardiovascular disorders: Secondary | ICD-10-CM

## 2019-02-06 MED ORDER — METRONIDAZOLE 500 MG PO TABS: ORAL_TABLET | ORAL | 1 refills | Status: DC

## 2019-02-06 MED ORDER — CIPROFLOXACIN HCL 500 MG PO TABS: ORAL_TABLET | ORAL | 1 refills | Status: DC

## 2019-02-06 MED ORDER — TRAMADOL HCL 50 MG PO TABS: ORAL_TABLET | ORAL | 0 refills | Status: DC

## 2019-02-06 NOTE — Progress Notes (Addendum)
Subjective:    Patient ID: Anita Blake, female    DOB: 11-Jul-1946, 73 y.o.   MRN: [id number]  HPI   This very nice 73  YO MBF with labile HTN, HLD , GERD presents with a 4-5 day hx/o LLQ pains  and no fever /chills,  nausea, bloating,no definite HB or Reflux, N/V, D/C or blood in BM's.     She has had concern over recent lower Lt sided chest pain & does report freq palpitations.  Medication Sig  . aspirin 81 MG tablet Take 81 mg by mouth daily.  . Cholecalciferol (VITAMIN D PO) Take 10,000 Int'l Units by mouth daily.   Marland Kitchen DICYCLOMINE HCL PO Take 10 mg by mouth. Take up to 4 tablets prn for abdominal pain and cramping.  . famotidine (PEPCID) 40 MG tablet TAKE 1 TABLET(40 MG) BY MOUTH TWICE DAILY  . ferrous sulfate 325 (65 FE) MG EC tablet Take 325 mg by mouth daily with breakfast.  . LIVALO 4 MG TABS TAKE 1 TABLET BY MOUTH AT BEDTIME  . MAGNESIUM PO Take 500 mg by mouth.    Allergies  Allergen Reactions  . Anesthetics, Halogenated     Difficulty with waking up from anesthesia from [facility], had aphasia, negative CT  . Crestor [Rosuvastatin]   . Lopid [Gemfibrozil]   . Latex Rash and Swelling    [Rash] redness/swelling/rash   Past Medical History:  Diagnosis Date  . Anemia yrs ago  . Arthritis   . Complication of anesthesia    slow to wake up  . GERD (gastroesophageal reflux disease)   . HLD (hyperlipidemia)   . Hyperlipidemia   . Labile hypertension    no meds  . Nonalcoholic steatohepatitis (NASH)   . Prediabetes   . Vitamin D deficiency    Review of Systems    10 point systems review negative except as above.    Objective:   Physical Exam  BP (!) 142/80   Pulse (!) 56   Temp 98.1 F (36.7 C)   Resp 18   Ht 5' 3.5" (1.613 m)   Wt 236 lb (107 kg)   BMI 41.15 kg/m   HEENT - WNL. Neck - supple.  Chest - Clear equal BS. Cor - Nl HS. RRR w/o sig MGR. PP 1(+). No edema. Abd  Soft . Nl BS. Sl tenderness  w/o G or RB in LLQ.  MS- FROM w/o deformities.  Gait Nl. Neuro  -  Nl w/o focal abnormalities.    Assessment & Plan:   1. Diverticulitis  - ciprofloxacin  500 MG tablet; Take 1 tablet 2 x /day with meals for Infection  Disp: 20 tablet  - metroNIDAZOLE500 MG tablet; Take 1 tablet 3 x/day with meals for infection  Disp: 30 tablet; Refill: 1  - traMADol  50 MG tablet; Take 1 tablet every 4 hours if needed for Severe Pain  Dispense: 30 tablet  2. Labile hypertension  3. Chest pain, unspecified type  - EKG 12-Lead  4. Palpitations  - EKG 12-Lead  5. Screening for ischemic heart disease  - EKG 12-Lead . . . . . . . . . . . . . . . . . . . . . . . . . . . . . . . . . . . . . . . . . . . . . . . .  . . . . . . . . . . . . . . .  . . . Marland Kitchen -  discussed meds, SE, prudent liquid to soft diet a nd increase activity to exercise as able.   - Discussed return or ER precautions

## 2019-02-06 NOTE — Patient Instructions (Signed)
Diverticulitis  Diverticulitis is infection or inflammation of small pouches (diverticula) in the colon that form due to a condition called diverticulosis. Diverticula can trap stool (feces) and bacteria, causing infection and inflammation. Diverticulitis may cause severe stomach pain and diarrhea. It may lead to tissue damage in the colon that causes bleeding. The diverticula may also burst (rupture) and cause infected stool to enter other areas of the abdomen. Complications of diverticulitis can include:  Bleeding.  Severe infection.  Severe pain.  Rupture (perforation) of the colon.  Blockage (obstruction) of the colon. What are the causes? This condition is caused by stool becoming trapped in the diverticula, which allows bacteria to grow in the diverticula. This leads to inflammation and infection. What increases the risk? You are more likely to develop this condition if:  You have diverticulosis. The risk for diverticulosis increases if: ? You are overweight or obese. ? You use tobacco products. ? You do not get enough exercise.  You eat a diet that does not include enough fiber. High-fiber foods include fruits, vegetables, beans, nuts, and whole grains. What are the signs or symptoms? Symptoms of this condition may include:  Pain and tenderness in the abdomen. The pain is normally located on the left side of the abdomen, but it may occur in other areas.  Fever and chills.  Bloating.  Cramping.  Nausea.  Vomiting.  Changes in bowel routines.  Blood in your stool. How is this diagnosed? This condition is diagnosed based on:  Your medical history.  A physical exam.  Tests to make sure there is nothing else causing your condition. These tests may include: ? Blood tests. ? Urine tests. ? Imaging tests of the abdomen, including X-rays, ultrasounds, MRIs, or CT scans. How is this treated? Most cases of this condition are mild and can be treated at home.  Treatment may include:  Taking over-the-counter pain medicines.  Following a clear liquid diet.  Taking antibiotic medicines by mouth.  Rest. More severe cases may need to be treated at a hospital. Treatment may include:  Not eating or drinking.  Taking prescription pain medicine.  Receiving antibiotic medicines through an IV tube.  Receiving fluids and nutrition through an IV tube.  Surgery. When your condition is under control, your health care provider may recommend that you have a colonoscopy. This is an exam to look at the entire large intestine. During the exam, a lubricated, bendable tube is inserted into the anus and then passed into the rectum, colon, and other parts of the large intestine. A colonoscopy can show how severe your diverticula are and whether something else may be causing your symptoms. Follow these instructions at home: Medicines  Take over-the-counter and prescription medicines only as told by your health care provider. These include fiber supplements, probiotics, and stool softeners.  If you were prescribed an antibiotic medicine, take it as told by your health care provider. Do not stop taking the antibiotic even if you start to feel better.  Do not drive or use heavy machinery while taking prescription pain medicine. General instructions   Follow a full liquid diet or another diet as directed by your health care provider. After your symptoms improve, your health care provider may tell you to change your diet. He or she may recommend that you eat a diet that contains at least 25 g (25 grams) of fiber daily. Fiber makes it easier to pass stool. Healthy sources of fiber include: ? Berries. One cup contains 4-8 grams of  fiber. ? Beans or lentils. One half cup contains 5-8 grams of fiber. ? Green vegetables. One cup contains 4 grams of fiber.  Exercise for at least 30 minutes, 3 times each week. You should exercise hard enough to raise your heart rate and  break a sweat.  Keep all follow-up visits as told by your health care provider. This is important. You may need a colonoscopy. Contact a health care provider if:  Your pain does not improve.  You have a hard time drinking or eating food.  Your bowel movements do not return to normal. Get help right away if:  Your pain gets worse.  Your symptoms do not get better with treatment.  Your symptoms suddenly get worse.  You have a fever.  You vomit more than one time.  You have stools that are bloody, black, or tarry. Summary  Diverticulitis is infection or inflammation of small pouches (diverticula) in the colon that form due to a condition called diverticulosis. Diverticula can trap stool (feces) and bacteria, causing infection and inflammation.  You are at higher risk for this condition if you have diverticulosis and you eat a diet that does not include enough fiber.  Most cases of this condition are mild and can be treated at home. More severe cases may need to be treated at a hospital.  When your condition is under control, your health care provider may recommend that you have an exam called a colonoscopy. This exam can show how severe your diverticula are and whether something else may be causing your symptoms. This information is not intended to replace advice given to you by your health care provider. Make sure you discuss any questions you have with your health care provider. Document Released: 09/12/2005 Document Revised: 01/05/2017 Document Reviewed: 01/05/2017 Elsevier Interactive Patient Education  2019 [employer].

## 2019-02-07 ENCOUNTER — Encounter: Payer: Self-pay | Admitting: Internal Medicine

## 2019-02-07 NOTE — Addendum Note (Signed)
Addended by: Unk Pinto on: 02/07/2019 11:52 PM   Modules accepted: Orders

## 2019-02-08 MED ORDER — SODIUM CHLORIDE 0.9 % IV SOLN
10.00 | INTRAVENOUS | Status: DC
Start: ? — End: 2019-02-08

## 2019-02-08 MED ORDER — GENERIC EXTERNAL MEDICATION
10.00 | Status: DC
Start: ? — End: 2019-02-08

## 2019-02-23 NOTE — Progress Notes (Signed)
MEDICARE ANNUAL WELLNESS VISIT AND FOLLOW UP  Assessment:   Diagnoses and all orders for this visit:  Encounter for Medicare annual wellness exam  Labile hypertension At goal off medications; continue to monitor and keep log at home; Monitor blood pressure at home; call if consistently over 130/80 Continue DASH diet.   Reminder to go to the ER if any CP, SOB, nausea, dizziness, severe HA, changes vision/speech, left arm numbness and tingling and jaw pain.  Hiatal hernia with GERD Improved with PPI; continue protonix 40 mg daily Discussed diet, avoiding triggers and other lifestyle changes Follow up GI PRN  NASH (nonalcoholic steatohepatitis) Weight loss advised, avoid alcohol/tylenol, will monitor LFTs -     CMP/GFR  Prediabetes Discussed disease and risks Discussed diet/exercise, weight management  Dietary recommendations Physical Activity recommendations -     Hemoglobin A1c  Age-related nuclear cataract, unspecified laterality Followed by ophthalmonology  Hyperlipidemia, unspecified hyperlipidemia type Continue medications Continue low cholesterol diet and exercise.  Check lipid panel.  -     Lipid panel -     TSH  Macular hole, unspecified laterality Followed by ophthalmology  Medication management -     CBC with Differential/Platelet -     CMP/GFR -     Magnesium   Morbid obesity (BMI 40) Long discussion about weight loss, diet, and exercise Recommended diet heavy in fruits and veggies and low in animal meats, cheeses, and dairy products, appropriate calorie intake Discussed appropriate weight for height, next goal <220lb Follow up at next visit  Vitamin D deficiency At goal at recent check; continue to recommend supplementation for goal of 70-100 Defer vitamin D level    Over 40 minutes of exam, counseling, chart review and critical decision making was performed Future Appointments  Date Time Provider [place]  03/03/2019  9:00 PM [facility] None  05/29/2019  9:30 AM Liane Comber, NP [facility] None     Plan:   During the course of the visit the patient was educated and counseled about appropriate screening and preventive services including:    Pneumococcal vaccine   Prevnar 13  Influenza vaccine  Td vaccine  Screening electrocardiogram  Bone densitometry screening  Colorectal cancer screening  Diabetes screening  Glaucoma screening  Nutrition counseling   Advanced directives: requested   Subjective:  Anita Blake is a 73 y.o. female who presents for Medicare Annual Wellness Visit and 3 month follow up.   Her husband is blind with complex medical history, she is primary caregiver and reports quite busy with this but doing well with this and positive about her situation.   She has had persistent unexplained abdominal pain, neg CT and workup by our office and ED, saw GI who suspected discomfort r/t hiatal hernia, started protonix 40 mg, patient reports pain is overall improving over the last 2 weeks, denies pain today.   BMI is Body mass index is 40.45 kg/m., she has been working on diet and exercise. Wt Readings from Last 3 Encounters:  02/25/19 232 lb (105.2 kg)  02/06/19 236 lb (107 kg)  01/19/19 238 lb (108 kg)    Her blood pressure has been controlled at home (130/70-80), today their BP is BP: 138/86 She does workout. She denies chest pain, shortness of breath, dizziness.   She is on cholesterol medication (pitavastatin 4 mg every other day) and denies myalgias. Her cholesterol is at goal. The cholesterol last visit was:   Lab Results  Component Value Date  CHOL 168 08/29/2018   HDL 51 08/29/2018   LDLCALC 97 08/29/2018   TRIG 102 08/29/2018   CHOLHDL 3.3 08/29/2018    She has been working on diet and exercise for prediabetes/diet controlled T2DM, and denies foot ulcerations, increased appetite, nausea, paresthesia of the feet, polydipsia, polyuria, visual  disturbances, vomiting and weight loss. Last A1C in the office was:  Lab Results  Component Value Date   HGBA1C 6.0 (H) 08/29/2018   Last GFR: Lab Results  Component Value Date   GFRAA 107 10/23/2018   Patient is on Vitamin D supplement and at goal of 70 at last check:    Lab Results  Component Value Date   VD25OH 77 08/29/2018      Medication Review: Current Outpatient Medications on File Prior to Visit  Medication Sig Dispense Refill  . aspirin 81 MG tablet Take 81 mg by mouth daily.    . Cholecalciferol (VITAMIN D PO) Take 10,000 Int'l Units by mouth daily.     . ferrous sulfate 325 (65 FE) MG EC tablet Take 325 mg by mouth every other day.     Marland Kitchen LIVALO 4 MG TABS TAKE 1 TABLET BY MOUTH AT BEDTIME (Patient taking differently: Takes 1/2 tablet) 90 tablet 0  . MAGNESIUM PO Take 500 mg by mouth.     . pantoprazole (PROTONIX) 40 MG tablet Take 40 mg by mouth daily.    Marland Kitchen DICYCLOMINE HCL PO Take 10 mg by mouth. Take up to 4 tablets prn for abdominal pain and cramping.    . traMADol (ULTRAM) 50 MG tablet Take 1 tablet every 4 hours if needed for Severe Pain (Patient not taking: Reported on 02/25/2019) 30 tablet 0   No current facility-administered medications on file prior to visit.     Allergies  Allergen Reactions  . Anesthetics, Halogenated     Difficulty with waking up from anesthesia from [facility], had aphasia, negative CT  . Crestor [Rosuvastatin]   . Lopid [Gemfibrozil]   . Latex Rash and Swelling    [Rash] redness/swelling/rash    Current Problems (verified) Patient Active Problem List   Diagnosis Date Noted  . Other abnormal glucose (prediabetes) 11/18/2017  . Medication management 09/07/2015  . NASH (nonalcoholic steatohepatitis) 05/10/2015  . Vitamin D deficiency 01/11/2014  . Labile hypertension   . Hyperlipidemia   . GERD (gastroesophageal reflux disease)   . Morbid obesity (BMI 42.79) 10/07/2013  . Cataract 10/30/2012  . Macular hole 10/25/2011    Screening  Tests Immunization History  Administered Date(s) Administered  . DT 05/10/2015  . Td 07/27/2005   Preventative care: Last colonoscopy: negative cologuard 2016 Colonoscopy 2017 Dr. Earlean Shawl, normal  EGD 2017  Last mammogram: 05/2017, DUE patient will schedule  MRI breasts: 02/2018 - neg Last pap smear/pelvic exam: 05/2018 - by Harle Battiest - sees annually DEXA:2002, has had since then in last 5 years, ? 2018 - managed by OBGYN - reports was normal   CT head 09/2013 MRI head: 12/2017 [place] AB 2012 US Soft tissues 2011 Stress test 05/2016  Prior vaccinations: TD or Tdap: 2016 Influenza: declines Pneumococcal: declines Prevnar13: declines Shingles/Zostavax: declines  Names of Other Physician/Practitioners you currently use: 1. [facility] here for primary care 2. Dr. Kathrin Penner, Dr. Renford Dills, eye doctor, wears glasses 01/29/2019 3. Dr. Randol Kern, Dr. Rayann Heman, dentist, last visit 02/2019 - sees q3 month  Patient Care Team: Unk Pinto, MD as PCP - General (Internal Medicine) Richmond Campbell, MD as Consulting Physician (Gastroenterology) Mezer,  Nadara Mustard, MD as Consulting Physician (Gynecology) Paralee Cancel, MD as Consulting Physician (Orthopedic Surgery)  SURGICAL HISTORY She  has a past surgical history that includes Abdominal hysterectomy; Eye surgery (Left, 2007); cataract surgery (Left); right arm elbow surgery; index finger surgery (Right); and Total hip arthroplasty (Left, 10/06/2013). FAMILY HISTORY Her family history includes Alzheimer's disease in an other family member; Breast cancer in her cousin; Heart disease in her mother; Kidney failure in her mother; Stroke in her father. SOCIAL HISTORY She  reports that she has never smoked. She has never used smokeless tobacco. She reports that she does not drink alcohol or use drugs.   MEDICARE WELLNESS OBJECTIVES: Physical activity: Current Exercise Habits: Home exercise routine, Type of exercise:  walking, Time (Minutes): 40, Frequency (Times/Week): 7, Weekly Exercise (Minutes/Week): 280, Intensity: Mild, Exercise limited by: None identified Cardiac risk factors: Cardiac Risk Factors include: advanced age (>90mn, >>11women);dyslipidemia;obesity (BMI >30kg/m2) Depression/mood screen:   [facility]  Decreased Interest 0  Down, Depressed, Hopeless 0  PHQ - 2 Score 0    ADLs:  In your present state of health, do you have any difficulty performing the following activities: 02/25/2019 02/07/2019  Hearing? N N  Vision? N N  Difficulty concentrating or making decisions? N N  Walking or climbing stairs? N N  Dressing or bathing? N N  Doing errands, shopping? N N  Some recent data might be hidden     Cognitive Testing  Alert? Yes  Normal Appearance?Yes  Oriented to person? Yes  Place? Yes   Time? Yes  Recall of three objects?  Yes  Can perform simple calculations? Yes  Displays appropriate judgment?Yes  Can read the correct time from a watch face?Yes  EOL planning: Does Patient Have a Medical Advance Directive?: Yes Type of Advance Directive: Healthcare Power of Attorney, Living will Does patient want to make changes to medical advance directive?: No - Patient declined Copy of [place]?: No - copy requested Would patient like information on creating a medical advance directive?: No - Patient declined  Review of Systems  Constitutional: Negative for malaise/fatigue and weight loss.  HENT: Negative for hearing loss and tinnitus.   Eyes: Negative for blurred vision and double vision.  Respiratory: Negative for cough, sputum production, shortness of breath and wheezing.   Cardiovascular: Negative for chest pain, palpitations, orthopnea, claudication, leg swelling and PND.  Gastrointestinal: Negative for abdominal pain, blood in stool, constipation, diarrhea, heartburn, melena, nausea and vomiting.  Genitourinary: Negative.    Musculoskeletal: Negative for falls, joint pain and myalgias.       Intermittent cramp to L thigh   Skin: Negative for rash.  Neurological: Negative for dizziness, tingling, sensory change, weakness and headaches.  Endo/Heme/Allergies: Negative for polydipsia.  Psychiatric/Behavioral: Negative.  Negative for depression, memory loss, substance abuse and suicidal ideas. The patient is not nervous/anxious and does not have insomnia.   All other systems reviewed and are negative.    Objective:     Today's Vitals   02/25/19 0939 02/25/19 1013  BP: (!) 160/92 138/86  Pulse: 71   Temp: (!) 97.5 F (36.4 C)   SpO2: 97%   Weight: 232 lb (105.2 kg)   Height: 5' 3.5" (1.613 m)    Body mass index is 40.45 kg/m.  General appearance: alert, no distress, WD/WN, female HEENT: normocephalic, sclerae anicteric, TMs pearly, nares patent, no discharge or erythema, pharynx normal Oral cavity: MMM, no lesions Neck: supple, no lymphadenopathy, no  thyromegaly, no masses Heart: RRR, normal S1, S2, no murmurs Lungs: CTA bilaterally, no wheezes, rhonchi, or rales Abdomen: +bs, soft, non tender, non distended, no masses, no hepatomegaly, no splenomegaly Musculoskeletal: nontender, no swelling, no obvious deformity Extremities: no edema, no cyanosis, no clubbing Pulses: 2+ symmetric, upper and lower extremities, normal cap refill Neurological: alert, oriented x 3, CN2-12 intact, strength normal upper extremities and lower extremities, sensation normal throughout, DTRs 2+ throughout, no cerebellar signs, gait normal Psychiatric: normal affect, behavior normal, pleasant   Medicare Attestation I have personally reviewed: The patient's medical and social history Their use of alcohol, tobacco or illicit drugs Their current medications and supplements The patient's functional ability including ADLs,fall risks, home safety risks, cognitive, and hearing and visual impairment Diet and physical  activities Evidence for depression or mood disorders  The patient's weight, height, BMI, and visual acuity have been recorded in the chart.  I have made referrals, counseling, and provided education to the patient based on review of the above and I have provided the patient with a written personalized care plan for preventive services.     Izora Ribas, NP   02/25/2019

## 2019-02-25 ENCOUNTER — Encounter: Payer: Self-pay | Admitting: Adult Health

## 2019-02-25 ENCOUNTER — Ambulatory Visit (INDEPENDENT_AMBULATORY_CARE_PROVIDER_SITE_OTHER): Payer: Medicare Other | Admitting: Adult Health

## 2019-02-25 ENCOUNTER — Other Ambulatory Visit: Payer: Self-pay

## 2019-02-25 VITALS — BP 138/86 | HR 71 | Temp 97.5°F | Ht 63.5 in | Wt 232.0 lb

## 2019-02-25 DIAGNOSIS — K7581 Nonalcoholic steatohepatitis (NASH): Secondary | ICD-10-CM | POA: Diagnosis not present

## 2019-02-25 DIAGNOSIS — K219 Gastro-esophageal reflux disease without esophagitis: Secondary | ICD-10-CM

## 2019-02-25 DIAGNOSIS — Z79899 Other long term (current) drug therapy: Secondary | ICD-10-CM

## 2019-02-25 DIAGNOSIS — Z0001 Encounter for general adult medical examination with abnormal findings: Secondary | ICD-10-CM

## 2019-02-25 DIAGNOSIS — H2510 Age-related nuclear cataract, unspecified eye: Secondary | ICD-10-CM

## 2019-02-25 DIAGNOSIS — H35349 Macular cyst, hole, or pseudohole, unspecified eye: Secondary | ICD-10-CM

## 2019-02-25 DIAGNOSIS — E559 Vitamin D deficiency, unspecified: Secondary | ICD-10-CM | POA: Diagnosis not present

## 2019-02-25 DIAGNOSIS — R0989 Other specified symptoms and signs involving the circulatory and respiratory systems: Secondary | ICD-10-CM

## 2019-02-25 DIAGNOSIS — R6889 Other general symptoms and signs: Secondary | ICD-10-CM

## 2019-02-25 DIAGNOSIS — E785 Hyperlipidemia, unspecified: Secondary | ICD-10-CM

## 2019-02-25 DIAGNOSIS — R7309 Other abnormal glucose: Secondary | ICD-10-CM

## 2019-02-25 DIAGNOSIS — Z0000 Encounter for general adult medical examination without abnormal findings: Secondary | ICD-10-CM

## 2019-02-25 DIAGNOSIS — K449 Diaphragmatic hernia without obstruction or gangrene: Secondary | ICD-10-CM

## 2019-02-25 NOTE — Patient Instructions (Addendum)
Anita Blake , Thank you for taking time to come for your Medicare Wellness Visit. I appreciate your ongoing commitment to your health goals. Please review the following plan we discussed and let me know if I can assist you in the future.   These are the goals we discussed: Goals    . Blood Pressure < 130/80    . HEMOGLOBIN A1C < 5.7    . LDL CALC < 100    . Weight (lb) < 220 lb (99.8 kg)       This is a list of the screening recommended for you and due dates:  Health Maintenance  Topic Date Due  . Pneumonia vaccines (1 of 2 - PCV13) 07/16/2013  . Mammogram  05/24/2019  . Urine Protein Check  05/28/2019  . Tetanus Vaccine  05/09/2025  . Colon Cancer Screening  08/16/2026  . DEXA scan (bone density measurement)  Completed  .  Hepatitis C: One time screening is recommended by Center for Disease Control  (CDC) for  adults born from [birth year] through 1963.   Completed  . Flu Shot  Discontinued      Hiatal Hernia  A hiatal hernia occurs when part of the stomach slides above the muscle that separates the abdomen from the chest (diaphragm). A person can be born with a hiatal hernia (congenital), or it may develop over time. In almost all cases of hiatal hernia, only the top part of the stomach pushes through the diaphragm. Many people have a hiatal hernia with no symptoms. The larger the hernia, the more likely it is that you will have symptoms. In some cases, a hiatal hernia allows stomach acid to flow back into the tube that carries food from your mouth to your stomach (esophagus). This may cause heartburn symptoms. Severe heartburn symptoms may mean that you have developed a condition called gastroesophageal reflux disease (GERD). What are the causes? This condition is caused by a weakness in the opening (hiatus) where the esophagus passes through the diaphragm to attach to the upper part of the stomach. A person may be born with a weakness in the hiatus, or a weakness can develop over time.  What increases the risk? This condition is more likely to develop in:  Older people. Age is a major risk factor for a hiatal hernia, especially if you are over the age of 1.  Pregnant women.  People who are overweight.  People who have frequent constipation. What are the signs or symptoms? Symptoms of this condition usually develop in the form of GERD symptoms. Symptoms include:  Heartburn.  Belching.  Indigestion.  Trouble swallowing.  Coughing or wheezing.  Sore throat.  Hoarseness.  Chest pain.  Nausea and vomiting. How is this diagnosed? This condition may be diagnosed during testing for GERD. Tests that may be done include:  X-rays of your stomach or chest.  An upper gastrointestinal (GI) series. This is an X-ray exam of your GI tract that is taken after you swallow a chalky liquid that shows up clearly on the X-ray.  Endoscopy. This is a procedure to look into your stomach using a thin, flexible tube that has a tiny camera and light on the end of it. How is this treated? This condition may be treated by:  Dietary and lifestyle changes to help reduce GERD symptoms.  Medicines. These may include: ? Over-the-counter antacids. ? Medicines that make your stomach empty more quickly. ? Medicines that block the production of stomach acid (H2 blockers). ?  Stronger medicines to reduce stomach acid (proton pump inhibitors).  Surgery to repair the hernia, if other treatments are not helping. If you have no symptoms, you may not need treatment. Follow these instructions at home: Lifestyle and activity  Do not use any products that contain nicotine or tobacco, such as cigarettes and e-cigarettes. If you need help quitting, ask your health care provider.  Try to achieve and maintain a healthy body weight.  Avoid putting pressure on your abdomen. Anything that puts pressure on your abdomen increases the amount of acid that may be pushed up into your esophagus. ?  Avoid bending over, especially after eating. ? Raise the head of your bed by putting blocks under the legs. This keeps your head and esophagus higher than your stomach. ? Do not wear tight clothing around your chest or stomach. ? Try not to strain when having a bowel movement, when urinating, or when lifting heavy objects. Eating and drinking  Avoid foods that can worsen GERD symptoms. These may include: ? Fatty foods, like fried foods. ? Citrus fruits, like oranges or lemon. ? Other foods and drinks that contain acid, like orange juice or tomatoes. ? Spicy food. ? Chocolate.  Eat frequent small meals instead of three large meals a day. This helps prevent your stomach from getting too full. ? Eat slowly. ? Do not lie down right after eating. ? Do not eat 1-2 hours before bed.  Do not drink beverages with caffeine. These include cola, coffee, cocoa, and tea.  Do not drink alcohol. General instructions  Take over-the-counter and prescription medicines only as told by your health care provider.  Keep all follow-up visits as told by your health care provider. This is important. Contact a health care provider if:  Your symptoms are not controlled with medicines or lifestyle changes.  You are having trouble swallowing.  You have coughing or wheezing that will not go away. Get help right away if:  Your pain is getting worse.  Your pain spreads to your arms, neck, jaw, teeth, or back.  You have shortness of breath.  You sweat for no reason.  You feel sick to your stomach (nauseous) or you vomit.  You vomit blood.  You have bright red blood in your stools.  You have black, tarry stools. This information is not intended to replace advice given to you by your health care provider. Make sure you discuss any questions you have with your health care provider. Document Released: 02/23/2004 Document Revised: 07/08/2017 Document Reviewed: 07/08/2017 Elsevier Interactive Patient  Education  2019 [employer].

## 2019-02-26 LAB — CBC WITH DIFFERENTIAL/PLATELET
Absolute Monocytes: 567 cells/uL (ref 200–950)
Basophils Absolute: 28 cells/uL (ref 0–200)
Basophils Relative: 0.4 %
Eosinophils Absolute: 210 cells/uL (ref 15–500)
Eosinophils Relative: 3 %
HCT: 39 % (ref 35.0–45.0)
Hemoglobin: 12.3 g/dL (ref 11.7–15.5)
Lymphs Abs: 2436 cells/uL (ref 850–3900)
MCH: 25.4 pg — ABNORMAL LOW (ref 27.0–33.0)
MCHC: 31.5 g/dL — AB (ref 32.0–36.0)
MCV: 80.6 fL (ref 80.0–100.0)
MPV: 11.1 fL (ref 7.5–12.5)
Monocytes Relative: 8.1 %
Neutro Abs: 3759 cells/uL (ref 1500–7800)
Neutrophils Relative %: 53.7 %
Platelets: 263 10*3/uL (ref 140–400)
RBC: 4.84 10*6/uL (ref 3.80–5.10)
RDW: 13.8 % (ref 11.0–15.0)
TOTAL LYMPHOCYTE: 34.8 %
WBC: 7 10*3/uL (ref 3.8–10.8)

## 2019-02-26 LAB — COMPLETE METABOLIC PANEL WITH GFR
AG Ratio: 1.4 (calc) (ref 1.0–2.5)
ALT: 20 U/L (ref 6–29)
AST: 27 U/L (ref 10–35)
Albumin: 4.3 g/dL (ref 3.6–5.1)
Alkaline phosphatase (APISO): 53 U/L (ref 37–153)
BUN: 11 mg/dL (ref 7–25)
CHLORIDE: 100 mmol/L (ref 98–110)
CO2: 30 mmol/L (ref 20–32)
Calcium: 10 mg/dL (ref 8.6–10.4)
Creat: 0.7 mg/dL (ref 0.60–0.93)
GFR, Est African American: 102 mL/min/{1.73_m2} (ref >=60)
GFR, Est Non African American: 88 mL/min/{1.73_m2} (ref >=60)
Globulin: 3.1 g/dL (calc) (ref 1.9–3.7)
Glucose, Bld: 93 mg/dL (ref 65–99)
Potassium: 4.7 mmol/L (ref 3.5–5.3)
Sodium: 139 mmol/L (ref 135–146)
Total Bilirubin: 0.4 mg/dL (ref 0.2–1.2)
Total Protein: 7.4 g/dL (ref 6.1–8.1)

## 2019-02-26 LAB — LIPID PANEL
Cholesterol: 190 mg/dL (ref <200)
HDL: 55 mg/dL (ref >=50)
LDL Cholesterol (Calc): 113 mg/dL (calc) — ABNORMAL HIGH
Non-HDL Cholesterol (Calc): 135 mg/dL (calc) — ABNORMAL HIGH (ref <130)
Total CHOL/HDL Ratio: 3.5 (calc) (ref <5.0)
Triglycerides: 114 mg/dL (ref <150)

## 2019-02-26 LAB — TSH: TSH: 1.61 mIU/L (ref 0.40–4.50)

## 2019-02-26 LAB — MAGNESIUM: Magnesium: 2 mg/dL (ref 1.5–2.5)

## 2019-02-26 LAB — HEMOGLOBIN A1C
EAG (MMOL/L): 7 (calc)
Hgb A1c MFr Bld: 6 % of total Hgb — ABNORMAL HIGH (ref <5.7)
Mean Plasma Glucose: 126 (calc)

## 2019-03-03 ENCOUNTER — Ambulatory Visit (INDEPENDENT_AMBULATORY_CARE_PROVIDER_SITE_OTHER): Payer: Medicare Other | Admitting: Neurology

## 2019-03-03 DIAGNOSIS — R0683 Snoring: Secondary | ICD-10-CM

## 2019-03-03 DIAGNOSIS — G4733 Obstructive sleep apnea (adult) (pediatric): Secondary | ICD-10-CM

## 2019-03-03 DIAGNOSIS — R7303 Prediabetes: Secondary | ICD-10-CM

## 2019-03-03 DIAGNOSIS — K219 Gastro-esophageal reflux disease without esophagitis: Secondary | ICD-10-CM

## 2019-03-03 DIAGNOSIS — R0989 Other specified symptoms and signs involving the circulatory and respiratory systems: Secondary | ICD-10-CM

## 2019-03-03 DIAGNOSIS — K7581 Nonalcoholic steatohepatitis (NASH): Secondary | ICD-10-CM

## 2019-03-03 DIAGNOSIS — E785 Hyperlipidemia, unspecified: Secondary | ICD-10-CM

## 2019-03-03 DIAGNOSIS — G4734 Idiopathic sleep related nonobstructive alveolar hypoventilation: Principal | ICD-10-CM

## 2019-03-04 ENCOUNTER — Other Ambulatory Visit: Payer: Self-pay

## 2019-03-04 ENCOUNTER — Other Ambulatory Visit: Payer: Self-pay | Admitting: Internal Medicine

## 2019-03-04 NOTE — Addendum Note (Signed)
Addended by: Larey Seat on: 03/04/2019 04:37 PM   Modules accepted: Orders

## 2019-03-04 NOTE — Procedures (Signed)
PATIENT'S NAME:  Emmanuel, Ercole DOB:      24-Jun-1946      MR#:    [id number]     DATE OF RECORDING: 03/03/2019 [place] REFERRING M.D.:  Unk Pinto, MD Study Performed:   Baseline Polysomnogram with parasomnia montage  HISTORY:  Anita Blake is a 73 y.o. female patient, seen on 01-19-2019 in a referral from Dr. Melford Aase for evaluation of fragmented sleep, daytime fatigue and sleepiness, in the setting of anemia and possibly apnea.    Chief complaint according to patient: The pt states that all her life she has slept in 2 hr. increments. She has been told she snores, never had a sleep study. Her complaints of fatigue during the day are not reflected in FSS. She states that she always felt she pushes herself to stay awake. PCP sent her because lately the energy level had been severely decreased. Recently started iron supplement due to low iron sats and she has felt better. Gerline Legacy, RN.  Sleep habits are as follows: The patient adheres to a bedtime of 11.30 PM, and goes to sleep within 30 minutes  Her husband has noted her to snore. She reports very vivid dreams, has one nocturia and will afterwards continue her last dream. Seems to dream every night. Sleep talking.   Review of Systems: Snoring, fatigue, anemia, HTN, EDS- fragmented sleep, vivid dreams, dreams but takes no naps. No cataplexy.  GERD  The patient endorsed the Epworth Sleepiness Scale at 2 points. FSS at 15/63.   The patient's weight 238 pounds with a height of 63 (inches), resulting in a BMI of 42.2 kg/m2. The patient's neck circumference measured 16 inches.  CURRENT MEDICATIONS: Aspirin, Pepcid, Livalo   PROCEDURE:  This is a multichannel digital polysomnogram utilizing the Somnostar 11.2 system.  Electrodes and sensors were applied and monitored per AASM Specifications.   EEG, EOG, Chin and Limb EMG, were sampled at 200 Hz.  ECG, Snore and Nasal Pressure, Thermal Airflow, Respiratory Effort, CPAP Flow and Pressure, Oximetry was  sampled at 50 Hz. Digital video and audio were recorded.      BASELINE STUDY: Lights Out was at 21:45 and Lights On at 05:00.  Total recording time (TRT) was 435 minutes, with a total sleep time (TST) of 317 minutes.   The patient's sleep latency was 35 minutes.  REM latency was 80 minutes.  The sleep efficiency was 72.9 %.     SLEEP ARCHITECTURE: WASO (Wake after sleep onset) was 100 minutes.  There were 32 minutes in Stage N1, 75 minutes Stage N2, 124 minutes Stage N3 and 86 minutes in Stage REM.  The percentage of Stage N1 was 10.1%, Stage N2 was 23.7%, Stage N3 was 39.1% and Stage R (REM sleep) was 27.1%.   RESPIRATORY ANALYSIS:  There were a total of 88 respiratory events:  23 obstructive apneas, 0 central apneas and 0 mixed apneas with 65 hypopneas.     The total APNEA/HYPOPNEA INDEX (AHI) was 16.7/h. 78 events occurred in REM sleep and 14 events in NREM.  The REM AHI was 54.4 /hour, versus a non-REM AHI of 2.6. The patient spent 110.5 minutes of total sleep time in the supine position and 207 minutes in non-supine.  The supine AHI was 14.1 versus a non-supine AHI of 18.0/h.  OXYGEN SATURATION & C02:  The Wake baseline 02 saturation was 94%, with the lowest being 66%. Time spent below 89% saturation equaled 118 minutes.   AROUSALS: The arousals were noted as: 37  were spontaneous, 0 were associated with PLMs, and 21 were associated with respiratory events. The patient had a total of 0 Periodic Limb Movements.   Audio and video analysis did not show any abnormal or unusual movements, behaviors, phonations or vocalizations.  Loud Snoring was noted. EKG was in keeping with normal sinus rhythm (NSR).  IMPRESSION:  1. Moderate Obstructive Sleep Apnea (OSA) with loud snoring, strongly REM sleep accentuated at . 2. Prolonged hypoxemia for 118 minutes out of 317 minutes of sleep time.  3. Primary Snoring  RECOMMENDATIONS: The prolonged hypoxemia and REM sleep accentuation are characterizing a  form of OSA that is not mitigated by dental device or by inspire procedure.   1. Advise full-night, attended, CPAP titration study to optimize therapy.     I certify that I have reviewed the entire raw data recording prior to the issuance of this report in accordance with the Standards of Accreditation of the American Academy of Sleep Medicine (AASM)  Larey Seat, MD     03-04-2019 Diplomat, American Board of Psychiatry and Neurology  Diplomat, American Board of [place] Director, [facility] [employer]

## 2019-03-05 ENCOUNTER — Telehealth: Payer: Self-pay | Admitting: Neurology

## 2019-03-05 NOTE — Telephone Encounter (Signed)
-----   Message from Larey Seat, MD sent at 03/04/2019  4:37 PM EDT ----- IMPRESSION:  1. Moderate Obstructive Sleep Apnea (OSA) with loud snoring,  strongly REM sleep accentuated at . 2. Prolonged hypoxemia for 118 minutes out of 317 minutes of  sleep time.  3. Primary Snoring  RECOMMENDATIONS: The prolonged hypoxemia and REM sleep  accentuation are characterizing a form of OSA that is not  mitigated by dental device or by inspire procedure.   1. Advise full-night, attended, CPAP titration study to optimize  therapy. If needed  Give 02. Watch for REM BD.

## 2019-03-05 NOTE — Telephone Encounter (Signed)
I called pt. I advised pt that Dr. Brett Fairy reviewed their sleep study results and found that the patient has moderate sleep apnea dn hypoxemia and recommends that pt be treated with a cpap. Dr. Brett Fairy recommends that pt return for a repeat sleep study in order to properly titrate the cpap and ensure a good mask fit as well as assess if oxygen is needed. Pt is agreeable to returning for a titration study. I advised pt that our sleep lab will file with pt's insurance and call pt to schedule the sleep study when we hear back from the pt's insurance regarding coverage of this sleep study. Pt verbalized understanding of results. Pt had no questions at this time but was encouraged to call back if questions arise.

## 2019-03-09 ENCOUNTER — Telehealth: Payer: Self-pay | Admitting: Neurology

## 2019-03-09 DIAGNOSIS — K7581 Nonalcoholic steatohepatitis (NASH): Secondary | ICD-10-CM

## 2019-03-09 DIAGNOSIS — G4733 Obstructive sleep apnea (adult) (pediatric): Secondary | ICD-10-CM

## 2019-03-09 DIAGNOSIS — R0989 Other specified symptoms and signs involving the circulatory and respiratory systems: Secondary | ICD-10-CM

## 2019-03-09 DIAGNOSIS — G4734 Idiopathic sleep related nonobstructive alveolar hypoventilation: Principal | ICD-10-CM

## 2019-03-09 DIAGNOSIS — R7309 Other abnormal glucose: Secondary | ICD-10-CM

## 2019-03-09 NOTE — Telephone Encounter (Signed)
PATIENT'S NAME:  Anita Blake, Anita Blake DOB:      1946/02/03      MR#:    [id number]     DATE OF RECORDING: 03/03/2019 AL REFERRING M.D.:  Unk Pinto, MD Study Performed:   Baseline Polysomnogram with parasomnia montage   RESPIRATORY ANALYSIS:  There were a total of 88 respiratory events:  23 obstructive apneas, 0 central apneas and 0 mixed apneas with 65 hypopneas.     The total APNEA/HYPOPNEA INDEX (AHI) was 16.7/h. 78 events occurred in REM sleep and 14 events in NREM.  The REM AHI was 54.4 /hour, versus a non-REM AHI of 2.6. The patient spent 110.5 minutes of total sleep time in the supine position and 207 minutes in non-supine.  The supine AHI was 14.1 versus a non-supine AHI of 18.0/h.  OXYGEN SATURATION & C02:  The [place] baseline 02 saturation was 94%, with the lowest being 66%. Time spent below 89% saturation equaled 118 minutes.    IMPRESSION:  1. Moderate Obstructive Sleep Apnea (OSA) with loud snoring, strongly REM sleep accentuated.  2. Prolonged hypoxemia for 118 minutes out of 317 minutes of sleep time.  3. Primary Snoring  RECOMMENDATIONS: The prolonged hypoxemia and REM sleep accentuation are characterizing a form of OSA that is not mitigated by dental device or by inspire procedure.   1. Advise full-night, attended, CPAP titration study to optimize therapy.  2. May need o2 supplementation- prolonged hypoxemia in REM sleep up to 4.5 minutes. ( Epochs [phone]) 3. Watch for REM sleep audio and video, EMG activation.     Addendum 03-09-2019; Due to the [place] virus precautions we will not be able to invite [name] back for a CPAP titration, and can only offer auto CPAP with ONO ( Overnight Pulse-Oximetry) to evaluate if CPAP can help the hypoxemia or if oxygen is needed.    Larey Seat, MD     03-04-2019 Diplomat, American Board of Psychiatry and Neurology  Diplomat, [place] [facility] Market researcher, [facility] [employer]

## 2019-03-10 ENCOUNTER — Encounter: Payer: Self-pay | Admitting: Neurology

## 2019-03-10 NOTE — Telephone Encounter (Signed)
Sent a [employer] explaining everything and will also send the orders to [facility] to get the ball rolling.

## 2019-03-10 NOTE — Telephone Encounter (Signed)
Attempted to call the patient to inform her that we would have to set her up with a auto CPAP. It sounded like the patient answered and then hung up. I will send the patient a mychart message. If pt calls back I will speak with her.

## 2019-03-12 NOTE — Telephone Encounter (Signed)
Our sleep technician  Spoke with the patient and reviewed that auto CPAP orders will be placed and sent to [facility]. Pt verbalized understanding. Patient will need a follow up visit 31-90 day from starting.

## 2019-03-18 ENCOUNTER — Encounter: Payer: Self-pay | Admitting: Neurology

## 2019-05-10 ENCOUNTER — Other Ambulatory Visit: Payer: Self-pay | Admitting: Physician Assistant

## 2019-05-27 ENCOUNTER — Telehealth: Payer: Self-pay | Admitting: Neurology

## 2019-05-27 NOTE — Telephone Encounter (Signed)
Called the pt, she is scheduled for Monday at 8:30 am. Amy Lomax has opening same time wanted to ask the patient if she would be ok with moving into [facility] 8:30 slot (I placed on hold) Also wanted to know if she wanted in office or VV. LVM for the pt to call back.

## 2019-05-28 LAB — HM DIABETES EYE EXAM

## 2019-05-28 NOTE — Telephone Encounter (Signed)
Called the patient to discuss upcoming apt on Monday. No answer. LVM informing the patient I was moving her into Debbora Presto, NP schedule for Monday at same time. Advised the patient to call back to discuss options in office vs VV.

## 2019-05-29 ENCOUNTER — Encounter: Payer: Self-pay | Admitting: Adult Health

## 2019-06-01 ENCOUNTER — Ambulatory Visit: Payer: Self-pay | Admitting: Family Medicine

## 2019-06-01 ENCOUNTER — Ambulatory Visit: Payer: Medicare Other | Admitting: Neurology

## 2019-06-04 HISTORY — PX: OTHER SURGICAL HISTORY: SHX169

## 2019-06-04 NOTE — Progress Notes (Signed)
Complete Physical  Assessment and Plan:   Labile hypertension Elevated today but has been stable/at goal at home; Monitor blood pressure at home; call if consistently over 130/80 Will follow up sooner in 3 months Continue DASH diet.   Reminder to go to the ER if any CP, SOB, nausea, dizziness, severe HA, changes vision/speech, left arm numbness and tingling and jaw pain.  Gastroesophageal reflux disease, esophagitis presence not specified Well managed on current medications, continue PPI due to hital hernia, follow up GI prn  Discussed diet, avoiding triggers and other lifestyle changes  NASH (nonalcoholic steatohepatitis) Weight loss advised, avoid alcohol/tylenol CMP/GFR  Prediabetes Discussed disease and risks Discussed diet/exercise, weight management  Dietary recommendations Physical Activity recommendations -     Hemoglobin A1c  Abnormal MRI, breast  Resolved, normal MRI 02/2018; resume annual mammograms  Age-related nuclear cataract, unspecified laterality Followed by ophthalmonology  Hyperlipidemia, unspecified hyperlipidemia type Continue statin medication Continue low cholesterol diet and exercise.  Check lipid panel.  -     Lipid panel -     TSH  Macular hole, unspecified laterality Followed by ophthalmology  Morbid obesity (BMI 42.79) Long discussion about weight loss, diet, and exercise Recommended diet heavy in fruits and veggies and low in animal meats, cheeses, and dairy products, appropriate calorie intake Discussed appropriate weight for height  Follow up at next visit  Vitamin D deficiency Continue to recommend supplementation for goal of 70-100 Check vitamin D level   Orders Placed This Encounter  Procedures  . CBC with Differential/Platelet  . COMPLETE METABOLIC PANEL WITH GFR  . Magnesium  . Lipid panel  . TSH  . Hemoglobin A1c  . VITAMIN D 25 Hydroxy (Vit-D Deficiency, Fractures)  . Urinalysis, Routine w reflex microscopic  .  Microalbumin / creatinine urine ratio  . Cologuard  . EKG 12-Lead   Discussed med's effects and SE's. Screening labs and tests as requested with regular follow-up as recommended. Over 40 minutes of exam, counseling, chart review, and complex, high level critical decision making was performed this visit.   Future Appointments  Date Time Provider [place]  06/09/2019 10:00 AM Dohmeier, Asencion Partridge, MD [facility] None  03/01/2020  9:00 AM Liane Comber, NP [facility] None  06/09/2020 10:00 AM Liane Comber, NP [facility] None     HPI  73 y.o. female  presents for a complete physical and follow up for has Morbid obesity (BMI 42.79); Labile hypertension; Hyperlipidemia; Hiatal hernia with GERD; Vitamin D deficiency; NASH (nonalcoholic steatohepatitis); Medication management; Macular hole; Cataract; and Other abnormal glucose (prediabetes) on their problem list.   She is married, 1 daughter, no grandkids but has "granddogs." She is primary caregiver for husband who is blind.  She had R 5th digit cyst removal due to pain last week by Dr. Apolonio Schneiders and recovering well.   She endorses continued sensation of imbalance, feels as though she may fall over forward with bending over, had MRI head 12/2017 which was negative   She has GERD consequent of hiatal hernia per GI but feels well managed with lifestyle modification and famotidine 40 mg PRN only.  BMI is Body mass index is 40.28 kg/m., she has been working on diet and exercise. She is trying to get below 200 lb. She walks dog daily, 30 min. She eats minimal meat, pushes vegetables. 3-4 bottles of water daily.  Wt Readings from Last 3 Encounters:  06/08/19 231 lb (104.8 kg)  02/25/19 232 lb (105.2 kg)  02/06/19 236 lb (107 kg)  Her blood pressure has not been controlled at home (has been 130s/70-80s), today their BP is BP: (!) 168/78 She does workout. She denies chest pain, shortness of breath, dizziness, HA, blurry vision, diarrhea/GI sx.    She is on cholesterol medication (pitivastatin 2 mg daily only due to hx of myalgias with high dose statins) and denies myalgias. Her cholesterol is not at goal. The cholesterol last visit was:   Lab Results  Component Value Date   CHOL 190 02/25/2019   HDL 55 02/25/2019   LDLCALC 113 (H) 02/25/2019   TRIG 114 02/25/2019   CHOLHDL 3.5 02/25/2019   She has been working on diet and exercise for prediabetes, she is on bASA, she is not on ACE/ARB (BP controlled) and denies foot ulcerations, increased appetite, nausea, paresthesia of the feet, polydipsia, polyuria, visual disturbances, vomiting and weight loss. Last A1C in the office was:  Lab Results  Component Value Date   HGBA1C 6.0 (H) 02/25/2019   Last GFR: Lab Results  Component Value Date   GFRAA 102 02/25/2019   Patient is on Vitamin D supplement.   Lab Results  Component Value Date   VD25OH 77 08/29/2018      Current Medications:  Current Outpatient Medications on File Prior to Visit  Medication Sig Dispense Refill  . aspirin 81 MG tablet Take 81 mg by mouth daily.    . Cholecalciferol (VITAMIN D PO) Take 10,000 Int'l Units by mouth daily.     Marland Kitchen LIVALO 4 MG TABS TAKE 1 TABLET BY MOUTH AT BEDTIME (Patient taking differently: Take 1/2 tablet daily) 90 tablet 1  . MAGNESIUM PO Take 500 mg by mouth.     Marland Kitchen DICYCLOMINE HCL PO Take 10 mg by mouth. Take up to 4 tablets prn for abdominal pain and cramping.     No current facility-administered medications on file prior to visit.    Allergies:  Allergies  Allergen Reactions  . Anesthetics, Halogenated     Difficulty with waking up from anesthesia from [facility], had aphasia, negative CT  . Crestor [Rosuvastatin]   . Lopid [Gemfibrozil]   . Latex Rash and Swelling    [Rash] redness/swelling/rash   Medical History:  She has Morbid obesity (BMI 42.79); Labile hypertension; Hyperlipidemia; Hiatal hernia with GERD; Vitamin D deficiency; NASH (nonalcoholic steatohepatitis); Medication  management; Macular hole; Cataract; and Other abnormal glucose (prediabetes) on their problem list. Health Maintenance:   Immunization History  Administered Date(s) Administered  . DT 05/10/2015  . Td 07/27/2005   Preventative care: Last colonoscopy: negative cologuard 2016 - due, ordered Colonoscopy 2017 Dr. Earlean Shawl, normal EGD 2017   Last mammogram: 05/2017, dense breasts MRI breasts: 02/2018, normal, resume 3D mammograms Last pap smear/pelvic exam: 05/2018 - by Harle Battiest - sees annually DEXA: 2002, has had since then in last 5 years - managed by OBGYN  CT head 09/2013 MRI head: 12/2017 [place] AB 2012 US Soft tissues 2011 Stress test 05/2016  Prior vaccinations: TD or Tdap: 2016 Influenza: declines Pneumococcal: declines Prevnar13: declines Shingles/Zostavax: declines  Names of Other Physician/Practitioners you currently use: 1. [facility] here for primary care 2. Dr. Kathrin Penner, Dr. Renford Dills, eye doctor, wears glasses 01/2019 3. Dr. Randol Kern, Dr. Rayann Heman, dentist, last visit 05/2019 - sees q3 month  Patient Care Team: Unk Pinto, MD as PCP - General (Internal Medicine) Richmond Campbell, MD as Consulting Physician (Gastroenterology) Delila Pereyra, MD as Consulting Physician (Gynecology) Paralee Cancel, MD as Consulting Physician (Orthopedic Surgery)  Surgical History:  She has a past surgical history that includes Abdominal hysterectomy; Eye surgery (Left, 2007); cataract surgery (Left); right arm elbow surgery; index finger surgery (Right); Total hip arthroplasty (Left, 10/06/2013); and right 5th digit finger - cyst removal (Right, 06/04/2019). Family History:  Herfamily history includes Alzheimer's disease in an other family member; Heart disease in her mother; Kidney failure in her mother; Stroke in her father. Social History:  She reports that she has never smoked. She has never used smokeless tobacco. She reports that she does not drink  alcohol or use drugs.  Review of Systems: Review of Systems  Constitutional: Negative for malaise/fatigue and weight loss.  HENT: Negative for hearing loss and tinnitus.   Eyes: Negative for blurred vision and double vision.  Respiratory: Negative for cough, sputum production, shortness of breath and wheezing.   Cardiovascular: Negative for chest pain, palpitations, orthopnea, claudication, leg swelling and PND.  Gastrointestinal: Negative for abdominal pain, blood in stool, constipation, diarrhea, heartburn, melena, nausea and vomiting.  Genitourinary: Negative.   Musculoskeletal: Negative for falls, joint pain and myalgias.  Skin: Negative for rash.  Neurological: Negative for dizziness, tingling, sensory change, weakness and headaches.  Endo/Heme/Allergies: Negative for polydipsia.  Psychiatric/Behavioral: Negative.  Negative for depression, memory loss, substance abuse and suicidal ideas. The patient is not nervous/anxious and does not have insomnia.   All other systems reviewed and are negative.   Physical Exam: Estimated body mass index is 40.28 kg/m as calculated from the following:   Height as of this encounter: 5' 3.5" (1.613 m).   Weight as of this encounter: 231 lb (104.8 kg). BP (!) 168/78   Pulse 62   Temp (!) 97.3 F (36.3 C)   Ht 5' 3.5" (1.613 m)   Wt 231 lb (104.8 kg)   SpO2 99%   BMI 40.28 kg/m  General Appearance: Well nourished, in no apparent distress.  Eyes: PERRLA, EOMs, conjunctiva no swelling or erythema, - defer fundal exam to ophth Sinuses: No Frontal/maxillary tenderness  ENT/Mouth: Ext aud canals clear, normal light reflex with TMs without erythema, bulging. Good dentition. No erythema, swelling, or exudate on post pharynx. Tonsils not swollen or erythematous. Hearing normal.  Neck: Supple, thyroid normal. No bruits  Respiratory: Respiratory effort normal, BS equal bilaterally without rales, rhonchi, wheezing or stridor.  Cardio: RRR without  murmurs, rubs or gallops. Brisk peripheral pulses without edema.  Chest: symmetric, with normal excursions and percussion.  Breasts: Defer to GYN Abdomen: Soft, nontender, no guarding, rebound, hernias, masses, or organomegaly.  Lymphatics: Non tender without lymphadenopathy.  Genitourinary: Defer to GYN Musculoskeletal: Full ROM all peripheral extremities,5/5 strength, and normal gait.  Skin: Warm, dry without rashes, lesions, ecchymosis. Neuro: Cranial nerves intact, reflexes equal bilaterally. Normal muscle tone, no cerebellar symptoms. Sensation intact.  Psych: Awake and oriented X 3, normal affect, Insight and Judgment appropriate.   EKG: WNL no ST changes.  Gorden Harms Eland Lamantia 12:30 PM [facility]

## 2019-06-08 ENCOUNTER — Ambulatory Visit (INDEPENDENT_AMBULATORY_CARE_PROVIDER_SITE_OTHER): Payer: Medicare Other | Admitting: Adult Health

## 2019-06-08 ENCOUNTER — Encounter: Payer: Self-pay | Admitting: Adult Health

## 2019-06-08 ENCOUNTER — Other Ambulatory Visit: Payer: Self-pay

## 2019-06-08 VITALS — BP 168/78 | HR 62 | Temp 97.3°F | Ht 63.5 in | Wt 231.0 lb

## 2019-06-08 DIAGNOSIS — Z136 Encounter for screening for cardiovascular disorders: Secondary | ICD-10-CM

## 2019-06-08 DIAGNOSIS — I10 Essential (primary) hypertension: Secondary | ICD-10-CM

## 2019-06-08 DIAGNOSIS — R7309 Other abnormal glucose: Secondary | ICD-10-CM

## 2019-06-08 DIAGNOSIS — Z0001 Encounter for general adult medical examination with abnormal findings: Secondary | ICD-10-CM

## 2019-06-08 DIAGNOSIS — R0989 Other specified symptoms and signs involving the circulatory and respiratory systems: Secondary | ICD-10-CM

## 2019-06-08 DIAGNOSIS — K219 Gastro-esophageal reflux disease without esophagitis: Secondary | ICD-10-CM

## 2019-06-08 DIAGNOSIS — Z1211 Encounter for screening for malignant neoplasm of colon: Secondary | ICD-10-CM

## 2019-06-08 DIAGNOSIS — E559 Vitamin D deficiency, unspecified: Secondary | ICD-10-CM

## 2019-06-08 DIAGNOSIS — Z0000 Encounter for general adult medical examination without abnormal findings: Secondary | ICD-10-CM

## 2019-06-08 DIAGNOSIS — K7581 Nonalcoholic steatohepatitis (NASH): Secondary | ICD-10-CM

## 2019-06-08 DIAGNOSIS — Z79899 Other long term (current) drug therapy: Secondary | ICD-10-CM

## 2019-06-08 DIAGNOSIS — R922 Inconclusive mammogram: Secondary | ICD-10-CM

## 2019-06-08 DIAGNOSIS — E785 Hyperlipidemia, unspecified: Secondary | ICD-10-CM

## 2019-06-08 DIAGNOSIS — N6310 Unspecified lump in the right breast, unspecified quadrant: Secondary | ICD-10-CM

## 2019-06-08 DIAGNOSIS — K449 Diaphragmatic hernia without obstruction or gangrene: Secondary | ICD-10-CM

## 2019-06-08 DIAGNOSIS — H2510 Age-related nuclear cataract, unspecified eye: Secondary | ICD-10-CM

## 2019-06-08 DIAGNOSIS — R9230 Dense breasts, unspecified: Secondary | ICD-10-CM

## 2019-06-08 DIAGNOSIS — H35349 Macular cyst, hole, or pseudohole, unspecified eye: Secondary | ICD-10-CM

## 2019-06-08 MED ORDER — FAMOTIDINE 40 MG PO TABS: 40.0000 mg | ORAL_TABLET | Freq: Every day | ORAL | 1 refills | Status: DC | PRN

## 2019-06-08 NOTE — Patient Instructions (Addendum)
  Anita Blake , Thank you for taking time to come for your Medicare Wellness Visit. I appreciate your ongoing commitment to your health goals. Please review the following plan we discussed and let me know if I can assist you in the future.   These are the goals we discussed: Goals    . Blood Pressure < 130/80    . HEMOGLOBIN A1C < 5.7    . LDL CALC < 100    . Weight (lb) < 220 lb (99.8 kg)       This is a list of the screening recommended for you and due dates:  Health Maintenance  Topic Date Due  . Mammogram  05/24/2019  . Urine Protein Check  05/28/2019  . Tetanus Vaccine  05/09/2025  . Colon Cancer Screening  08/16/2026  . DEXA scan (bone density measurement)  Completed  .  Hepatitis C: One time screening is recommended by Center for Disease Control  (CDC) for  adults born from [birth year] through 1963.   Completed  . Flu Shot  Discontinued  . Pneumonia vaccines  Discontinued    HOW TO SCHEDULE A MAMMOGRAM  [facility]  7 a.m.-6:30 p.m., Monday 7 a.m.-5 p.m., Tuesday-Friday Schedule an appointment by calling [phone].    Aim for 7+ servings of fruits and vegetables daily  65-80+ fluid ounces of water or unsweet tea for healthy kidneys  Limit to max 1 drink of alcohol per day; avoid smoking/tobacco  Limit animal fats in diet for cholesterol and heart health - choose grass fed whenever available  Avoid highly processed foods, and foods high in saturated/trans fats  Aim for low stress - take time to unwind and care for your mental health  Aim for 150 min of moderate intensity exercise weekly for heart health, and weights twice weekly for bone health  Aim for 7-9 hours of sleep daily

## 2019-06-09 ENCOUNTER — Telehealth: Payer: Self-pay | Admitting: Neurology

## 2019-06-09 ENCOUNTER — Ambulatory Visit: Payer: Self-pay | Admitting: Neurology

## 2019-06-09 LAB — CBC WITH DIFFERENTIAL/PLATELET
Absolute Monocytes: 462 cells/uL (ref 200–950)
Basophils Absolute: 41 cells/uL (ref 0–200)
Basophils Relative: 0.6 %
Eosinophils Absolute: 163 cells/uL (ref 15–500)
Eosinophils Relative: 2.4 %
HCT: 36.5 % (ref 35.0–45.0)
Hemoglobin: 11.7 g/dL (ref 11.7–15.5)
Lymphs Abs: 2523 cells/uL (ref 850–3900)
MCH: 25.6 pg — ABNORMAL LOW (ref 27.0–33.0)
MCHC: 32.1 g/dL (ref 32.0–36.0)
MCV: 79.9 fL — ABNORMAL LOW (ref 80.0–100.0)
MPV: 11.1 fL (ref 7.5–12.5)
Monocytes Relative: 6.8 %
Neutro Abs: 3611 cells/uL (ref 1500–7800)
Neutrophils Relative %: 53.1 %
Platelets: 271 10*3/uL (ref 140–400)
RBC: 4.57 10*6/uL (ref 3.80–5.10)
RDW: 14.2 % (ref 11.0–15.0)
Total Lymphocyte: 37.1 %
WBC: 6.8 10*3/uL (ref 3.8–10.8)

## 2019-06-09 LAB — COMPLETE METABOLIC PANEL WITH GFR
AG Ratio: 1.6 (calc) (ref 1.0–2.5)
ALT: 26 U/L (ref 6–29)
AST: 27 U/L (ref 10–35)
Albumin: 4.3 g/dL (ref 3.6–5.1)
Alkaline phosphatase (APISO): 57 U/L (ref 37–153)
BUN: 13 mg/dL (ref 7–25)
CO2: 30 mmol/L (ref 20–32)
Calcium: 9.8 mg/dL (ref 8.6–10.4)
Chloride: 102 mmol/L (ref 98–110)
Creat: 0.67 mg/dL (ref 0.60–0.93)
GFR, Est African American: 103 mL/min/{1.73_m2} (ref >=60)
GFR, Est Non African American: 89 mL/min/{1.73_m2} (ref >=60)
Globulin: 2.7 g/dL (calc) (ref 1.9–3.7)
Glucose, Bld: 84 mg/dL (ref 65–99)
Potassium: 4.3 mmol/L (ref 3.5–5.3)
Sodium: 140 mmol/L (ref 135–146)
Total Bilirubin: 0.6 mg/dL (ref 0.2–1.2)
Total Protein: 7 g/dL (ref 6.1–8.1)

## 2019-06-09 LAB — URINALYSIS, ROUTINE W REFLEX MICROSCOPIC
Bilirubin Urine: NEGATIVE
Glucose, UA: NEGATIVE
Hgb urine dipstick: NEGATIVE
Ketones, ur: NEGATIVE
Leukocytes,Ua: NEGATIVE
Nitrite: NEGATIVE
Protein, ur: NEGATIVE
Specific Gravity, Urine: 1.011 (ref 1.001–1.03)
pH: 7 (ref 5.0–8.0)

## 2019-06-09 LAB — HEMOGLOBIN A1C
Hgb A1c MFr Bld: 5.9 % of total Hgb — ABNORMAL HIGH (ref <5.7)
Mean Plasma Glucose: 123 (calc)
eAG (mmol/L): 6.8 (calc)

## 2019-06-09 LAB — MAGNESIUM: Magnesium: 1.8 mg/dL (ref 1.5–2.5)

## 2019-06-09 LAB — LIPID PANEL
Cholesterol: 180 mg/dL (ref <200)
HDL: 59 mg/dL (ref >=50)
LDL Cholesterol (Calc): 100 mg/dL (calc) — ABNORMAL HIGH
Non-HDL Cholesterol (Calc): 121 mg/dL (calc) (ref <130)
Total CHOL/HDL Ratio: 3.1 (calc) (ref <5.0)
Triglycerides: 117 mg/dL (ref <150)

## 2019-06-09 LAB — TSH: TSH: 1.7 mIU/L (ref 0.40–4.50)

## 2019-06-09 LAB — VITAMIN D 25 HYDROXY (VIT D DEFICIENCY, FRACTURES): Vit D, 25-Hydroxy: 76 ng/mL (ref 30–100)

## 2019-06-09 LAB — MICROALBUMIN / CREATININE URINE RATIO
Creatinine, Urine: 63 mg/dL (ref 20–275)
Microalb Creat Ratio: 8 mcg/mg creat (ref <30)
Microalb, Ur: 0.5 mg/dL

## 2019-06-09 NOTE — Telephone Encounter (Signed)
Patient was a no show to her initial CPAP visit. Patient started her machine on 03/23/2019 and would need to be seen by 06/22/19.

## 2019-06-16 ENCOUNTER — Telehealth: Payer: Self-pay | Admitting: Neurology

## 2019-06-16 NOTE — Telephone Encounter (Signed)
Patient called the sleep lab and left a message. I have called the patient back. Pt states last week she was here for her apt. She states she attempted to call in and no one answered. She states that no one ever called to get her to come in. I reminded her that she was suppose to have pulled under the entrance way for a staff member to meet her at the car. Patient stated that she parked facing the entrance on front row. Advised that I would make the billing aware that she was here and had misunderstood the directions. Patient was rescheduled and informed her that it would have to be with Debbora Presto, NP. Pt verbalized understanding. July 2nd rescheduled at 2:30 with check in of 2 pm. Advised to wear a mask to apt and informed that we are now having patients come into the office as normal for check in. Pt verbalized understanding.

## 2019-06-18 ENCOUNTER — Ambulatory Visit (INDEPENDENT_AMBULATORY_CARE_PROVIDER_SITE_OTHER): Payer: Medicare Other | Admitting: Family Medicine

## 2019-06-18 ENCOUNTER — Other Ambulatory Visit: Payer: Self-pay

## 2019-06-18 ENCOUNTER — Encounter: Payer: Self-pay | Admitting: Family Medicine

## 2019-06-18 DIAGNOSIS — G4733 Obstructive sleep apnea (adult) (pediatric): Secondary | ICD-10-CM | POA: Diagnosis not present

## 2019-06-18 DIAGNOSIS — Z9989 Dependence on other enabling machines and devices: Secondary | ICD-10-CM

## 2019-06-18 NOTE — Progress Notes (Signed)
PATIENT: Anita Blake DOB: January 17, 1946  REASON FOR VISIT: follow up HISTORY FROM: patient  Chief Complaint  Patient presents with  . Follow-up    Initial cpap. Alone. Rm 8. No new concerns at this time.      HISTORY OF PRESENT ILLNESS: Today 06/18/19 Anita Blake is a 73 y.o. female here today for follow up for OSA on CPAP.  Anita Blake returns today for initial CPAP compliance report.  She reports some obstacles in her way of using CPAP nightly.  She has been concerned about daily cleaning process.  She states that she did not have time in her day to clean her machine every day.  If she is unable to clean the machine she will not use CPAP.  She also states that her husband is ill.  Upon nights where he is not feeling well she is up and down so much she does not feel that it is useful to use CPAP.  She also has a dog.  She reports that he does not sleep well if he is not wearing his pajamas.  We have reviewed her compliance report dated 05/18/2019 through 06/16/2019.  She has used CPAP 15 out of the last 30 days for compliance of 50%.  13 of those days were greater than 4 hours for compliance of 43%.  AHI was 7.9 on 5 to 12 cm of water.  There was a mild leak in the 95th percentile of 21.1.  She does note that when she is using CPAP it helps significantly with GERD.  She also sleeps better at night.  She wakes less frequently.  HISTORY: (copied from Dr Dohmeier's note on 01/19/2019)  HPI:  Anita Blake is a 73 y.o. female patient, seen on 01-19-2019 in a referral from Dr. Melford Aase for evaluation of fragmented sleep, daytime fatigue and sleepiness, in the setting of anemia and possibly apnea.   Chief complaint according to patient :pt states that all her life she has slept in 2 hr increments. she has been told she snores in sleep. pt has never had a sleep study. pt complaints of fatigue during the day. she states that she always felt like she pushes herself to stay awake. PCP sent her because  laley the energy level had been severely decreased. recently started iron supplement due to low iron and she has felt that has gotten better.  Sleep habits are as follows: The patient adheres to a bedtime of 11.30 PM, and goes to sleep within 30 minutes - She describes her marital bedroom as cool, quiet and dark. She sleeps on her sides, but often changes position - she has an adjustable bed and sleeps on 1 or 2 pillows.  Her husband has noted her to snore. She reports very vivid dreams, has one nocturia and will continue her last dream. Seems to dream every night. She also has hiatal hernia GERD causing some sleep interruption. No headaches, she wakes up every 2 hours , looks at the clock. She wakes  at 4. 14 and 6. 31 AM , she rises between 7.31 and 8 .20 AM. She never used an alarm!  " I Have never overslept" .   Sleep medical history : hip replacement and knee problem have caused her to sleep more in supine or avoiding the left side. Obesity at BMI 41. Snoring has been present for longer than a decade.  NASH. Anemia with borderline iron levels.   Family medical/ sleep history: mother died  at 63 of ESRD, father at age 51 of CVA>   Social history: married, never smoked, none drinker, caffeine - none - no tea, coffee, soda.  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  ALLERGIES: Allergies  Allergen Reactions  . Anesthetics, Halogenated     Difficulty with waking up from anesthesia from [facility], had aphasia, negative CT  . Crestor [Rosuvastatin]   . Lopid [Gemfibrozil]   . Latex Rash and Swelling    [Rash] redness/swelling/rash    HOME MEDICATIONS: Outpatient Medications Prior to Visit   Medication Sig Dispense Refill  . aspirin 81 MG tablet Take 81 mg by mouth daily.    . Cholecalciferol (VITAMIN D PO) Take 10,000 Int'l Units by mouth daily.     Marland Kitchen DICYCLOMINE HCL PO Take 10 mg by mouth. Take up to 4 tablets prn for abdominal pain and cramping.    . famotidine (PEPCID) 40 MG tablet Take 1 tablet (40 mg total) by mouth daily as needed for heartburn or indigestion. 180 tablet 1  . LIVALO 4 MG TABS TAKE 1 TABLET BY MOUTH AT BEDTIME (Patient taking differently: Take 1/2 tablet daily) 90 tablet 1  . MAGNESIUM PO Take 500 mg by mouth.      No facility-administered medications prior to visit.     PAST MEDICAL HISTORY: Past Medical History:  Diagnosis Date  . Anemia yrs ago  . Arthritis   . Complication of anesthesia    slow to wake up  . GERD (gastroesophageal reflux disease)   . HLD (hyperlipidemia)   . Hyperlipidemia   . Labile hypertension    no meds  . Nonalcoholic steatohepatitis (NASH)   . Prediabetes   . Vitamin D deficiency     PAST SURGICAL HISTORY: Past Surgical History:  Procedure Laterality Date  . ABDOMINAL HYSTERECTOMY     partial, ovaries remain  . cataract surgery Left   . EYE SURGERY Left 2007   macular hole in retina  . index finger surgery Right   . right 5th digit finger - cyst removal Right 06/04/2019   Dr. Apolonio Schneiders  . right arm elbow surgery     ligament repair  . TOTAL HIP ARTHROPLASTY Left 10/06/2013   Procedure: LEFT TOTAL HIP ARTHROPLASTY ANTERIOR APPROACH;  Surgeon: Mauri Pole, MD;  Location: [facility];  Service: Orthopedics;  Laterality: Left;    FAMILY HISTORY: Family History  Problem Relation Age of Onset  . Heart disease Mother        Vague  . Kidney failure Mother   . Stroke Father   . Alzheimer's disease Other     SOCIAL HISTORY: Social History   Socioeconomic History  . Marital status: Married    Spouse name: Not on file  . Number of children: 1  . Years of education: Not on file  . Highest education level: Not  on file  Occupational History  . Occupation: Retired  Scientific laboratory technician  . Financial resource strain: Not on file  . Food insecurity    Worry: Not on file    Inability: Not on file  . Transportation needs    Medical: Not on file    Non-medical: Not on file  Tobacco Use  .  Smoking status: Never Smoker  . Smokeless tobacco: Never Used  Substance and Sexual Activity  . Alcohol use: No  . Drug use: No  . Sexual activity: Not Currently  Lifestyle  . Physical activity    Days per week: 7 days    Minutes per session: 30 min  . Stress: Only a little  Relationships  . Social Herbalist on phone: Not on file    Gets together: Not on file    Attends religious service: Not on file    Active member of club or organization: Not on file    Attends meetings of clubs or organizations: Not on file    Relationship status: Not on file  . Intimate partner violence    Fear of current or ex partner: Not on file    Emotionally abused: Not on file    Physically abused: Not on file    Forced sexual activity: Not on file  Other Topics Concern  . Not on file  Social History Narrative   Lives with husband.        PHYSICAL EXAM  Vitals:   06/18/19 1423  BP: (!) 176/89  Pulse: 68  Temp: (!) 93.7 F (34.3 C)  TempSrc: Oral  Weight: 234 lb 9.6 oz (106.4 kg)  Height: 5' 3.5" (1.613 m)   Body mass index is 40.91 kg/m.  Generalized: Well developed, in no acute distress  Cardiology: normal rate and rhythm, no murmur noted Neurological examination  Neck circumference: 16.25 inches Mentation: Alert oriented to time, place, history taking. Follows all commands speech and language fluent Cranial nerve II-XII: Pupils were equal round reactive to light. Extraocular movements were full, visual field were full on confrontational test. Facial sensation and strength were normal. Uvula tongue midline. Head turning and shoulder shrug  were normal and symmetric. Motor: The motor testing reveals 5  over 5 strength of all 4 extremities. Good symmetric motor tone is noted throughout.  Gait and station: Gait is normal.   DIAGNOSTIC DATA (LABS, IMAGING, TESTING) - I reviewed patient records, labs, notes, testing and imaging myself where available.  No flowsheet data found.   Lab Results  Component Value Date   WBC 6.8 06/08/2019   HGB 11.7 06/08/2019   HCT 36.5 06/08/2019   MCV 79.9 (L) 06/08/2019   PLT 271 06/08/2019      Component Value Date/Time   NA 140 06/08/2019 1217   K 4.3 06/08/2019 1217   CL 102 06/08/2019 1217   CO2 30 06/08/2019 1217   GLUCOSE 84 06/08/2019 1217   BUN 13 06/08/2019 1217   CREATININE 0.67 06/08/2019 1217   CALCIUM 9.8 06/08/2019 1217   PROT 7.0 06/08/2019 1217   ALBUMIN 4.0 05/16/2017 1033   AST 27 06/08/2019 1217   ALT 26 06/08/2019 1217   ALKPHOS 62 05/16/2017 1033   BILITOT 0.6 06/08/2019 1217   GFRNONAA 89 06/08/2019 1217   GFRAA 103 06/08/2019 1217   Lab Results  Component Value Date   CHOL 180 06/08/2019   HDL 59 06/08/2019   LDLCALC 100 (H) 06/08/2019   TRIG 117 06/08/2019   CHOLHDL 3.1 06/08/2019   Lab Results  Component Value Date   HGBA1C 5.9 (H) 06/08/2019   Lab Results  Component Value Date   KHTXHFSF42 395 05/27/2018   Lab Results  Component Value Date   TSH 1.70 06/08/2019    ASSESSMENT AND PLAN 73 y.o. year old female  has a past medical history of Anemia (  yrs ago), Arthritis, Complication of anesthesia, GERD (gastroesophageal reflux disease), HLD (hyperlipidemia), Hyperlipidemia, Labile hypertension, Nonalcoholic steatohepatitis (NASH), Prediabetes, and Vitamin D deficiency. here with     ICD-10-CM   1. OSA on CPAP  G47.33    Z99.89     Anita Blake reports that there have been many obstacles in her way of using CPAP nightly.  We have reviewed cleaning recommendations.  We have also discussed risk of untreated sleep apnea and heart attack.  Compliance data reveals suboptimal compliance with about 50% usage.  I  have encouraged her to use her CPAP machine nightly and for greater than 4 hours each night.  We have scheduled an 8-week follow-up to repeat compliance download review.  She will reach out for any concerns prior.  She verbalizes understanding and agreement with this plan.   No orders of the defined types were placed in this encounter.    No orders of the defined types were placed in this encounter.     I spent 15 minutes with the patient. 50% of this time was spent counseling and educating patient on plan of care and medications.    Anita Presto, FNP-C 06/18/2019, 2:54 PM [facility] [address], [place], [place] [address] [phone]

## 2019-06-18 NOTE — Patient Instructions (Signed)
Continue using CPAP every night and for greater than 4 hours each night  Follow up in 8 weeks   CPAP and BPAP Information CPAP and BPAP are methods of helping a person breathe with the use of air pressure. CPAP stands for "continuous positive airway pressure." BPAP stands for "bi-level positive airway pressure." In both methods, air is blown through your nose or mouth and into your air passages to help you breathe well. CPAP and BPAP use different amounts of pressure to blow air. With CPAP, the amount of pressure stays the same while you breathe in and out. With BPAP, the amount of pressure is increased when you breathe in (inhale) so that you can take larger breaths. Your health care provider will recommend whether CPAP or BPAP would be more helpful for you. Why are CPAP and BPAP treatments used? CPAP or BPAP can be helpful if you have:  Sleep apnea.  Chronic obstructive pulmonary disease (COPD).  Heart failure.  Medical conditions that weaken the muscles of the chest including muscular dystrophy, or neurological diseases such as amyotrophic lateral sclerosis (ALS).  Other problems that cause breathing to be weak, abnormal, or difficult. CPAP is most commonly used for obstructive sleep apnea (OSA) to keep the airways from collapsing when the muscles relax during sleep. How is CPAP or BPAP administered? Both CPAP and BPAP are provided by a small machine with a flexible plastic tube that attaches to a plastic mask. You wear the mask. Air is blown through the mask into your nose or mouth. The amount of pressure that is used to blow the air can be adjusted on the machine. Your health care provider will determine the pressure setting that should be used based on your individual needs. When should CPAP or BPAP be used? In most cases, the mask only needs to be worn during sleep. Generally, the mask needs to be worn throughout the night and during any daytime naps. People with certain medical  conditions may also need to wear the mask at other times when they are awake. Follow instructions from your health care provider about when to use the machine. What are some tips for using the mask?   Because the mask needs to be snug, some people feel trapped or closed-in (claustrophobic) when first using the mask. If you feel this way, you may need to get used to the mask. One way to do this is by holding the mask loosely over your nose or mouth and then gradually applying the mask more snugly. You can also gradually increase the amount of time that you use the mask.  Masks are available in various types and sizes. Some fit over your mouth and nose while others fit over just your nose. If your mask does not fit well, talk with your health care provider about getting a different one.  If you are using a mask that fits over your nose and you tend to breathe through your mouth, a chin strap may be applied to help keep your mouth closed.  The CPAP and BPAP machines have alarms that may sound if the mask comes off or develops a leak.  If you have trouble with the mask, it is very important that you talk with your health care provider about finding a way to make the mask easier to tolerate. Do not stop using the mask. Stopping the use of the mask could have a negative impact on your health. What are some tips for using the machine?  Place your CPAP or BPAP machine on a secure table or stand near an electrical outlet.  Know where the on/off switch is located on the machine.  Follow instructions from your health care provider about how to set the pressure on your machine and when you should use it.  Do not eat or drink while the CPAP or BPAP machine is on. Food or fluids could get pushed into your lungs by the pressure of the CPAP or BPAP.  Do not smoke. Tobacco smoke residue can damage the machine.  For home use, CPAP and BPAP machines can be rented or purchased through home health care  companies. Many different brands of machines are available. Renting a machine before purchasing may help you find out which particular machine works well for you.  Keep the CPAP or BPAP machine and attachments clean. Ask your health care provider for specific instructions. Get help right away if:  You have redness or open areas around your nose or mouth where the mask fits.  You have trouble using the CPAP or BPAP machine.  You cannot tolerate wearing the CPAP or BPAP mask.  You have pain, discomfort, and bloating in your abdomen. Summary  CPAP and BPAP are methods of helping a person breathe with the use of air pressure.  Both CPAP and BPAP are provided by a small machine with a flexible plastic tube that attaches to a plastic mask.  If you have trouble with the mask, it is very important that you talk with your health care provider about finding a way to make the mask easier to tolerate. This information is not intended to replace advice given to you by your health care provider. Make sure you discuss any questions you have with your health care provider. Document Released: 08/31/2004 Document Revised: 03/25/2019 Document Reviewed: 10/22/2016 Elsevier Patient Education  [place].   Sleep Apnea Sleep apnea affects breathing during sleep. It causes breathing to stop for a short time or to become shallow. It can also increase the risk of:  Heart attack.  Stroke.  Being very overweight (obese).  Diabetes.  Heart failure.  Irregular heartbeat. The goal of treatment is to help you breathe normally again. What are the causes? There are three kinds of sleep apnea:  Obstructive sleep apnea. This is caused by a blocked or collapsed airway.  Central sleep apnea. This happens when the brain does not send the right signals to the muscles that control breathing.  Mixed sleep apnea. This is a combination of obstructive and central sleep apnea. The most common cause of this  condition is a collapsed or blocked airway. This can happen if:  Your throat muscles are too relaxed.  Your tongue and tonsils are too large.  You are overweight.  Your airway is too small. What increases the risk?  Being overweight.  Smoking.  Having a small airway.  Being older.  Being female.  Drinking alcohol.  Taking medicines to calm yourself (sedatives or tranquilizers).  Having family members with the condition. What are the signs or symptoms?  Trouble staying asleep.  Being sleepy or tired during the day.  Getting angry a lot.  Loud snoring.  Headaches in the morning.  Not being able to focus your mind (concentrate).  Forgetting things.  Less interest in sex.  Mood swings.  Personality changes.  Feelings of sadness (depression).  Waking up a lot during the night to pee (urinate).  Dry mouth.  Sore throat. How is this diagnosed?  Your medical history.  A physical exam.  A test that is done when you are sleeping (sleep study). The test is most often done in a sleep lab but may also be done at home. How is this treated?   Sleeping on your side.  Using a medicine to get rid of mucus in your nose (decongestant).  Avoiding the use of alcohol, medicines to help you relax, or certain pain medicines (narcotics).  Losing weight, if needed.  Changing your diet.  Not smoking.  Using a machine to open your airway while you sleep, such as: ? An oral appliance. This is a mouthpiece that shifts your lower jaw forward. ? A CPAP device. This device blows air through a mask when you breathe out (exhale). ? An EPAP device. This has valves that you put in each nostril. ? A BPAP device. This device blows air through a mask when you breathe in (inhale) and breathe out.  Having surgery if other treatments do not work. It is important to get treatment for sleep apnea. Without treatment, it can lead to:  High blood pressure.  Coronary artery  disease.  In men, not being able to have an erection (impotence).  Reduced thinking ability. Follow these instructions at home: Lifestyle  Make changes that your doctor recommends.  Eat a healthy diet.  Lose weight if needed.  Avoid alcohol, medicines to help you relax, and some pain medicines.  Do not use any products that contain nicotine or tobacco, such as cigarettes, e-cigarettes, and chewing tobacco. If you need help quitting, ask your doctor. General instructions  Take over-the-counter and prescription medicines only as told by your doctor.  If you were given a machine to use while you sleep, use it only as told by your doctor.  If you are having surgery, make sure to tell your doctor you have sleep apnea. You may need to bring your device with you.  Keep all follow-up visits as told by your doctor. This is important. Contact a doctor if:  The machine that you were given to use during sleep bothers you or does not seem to be working.  You do not get better.  You get worse. Get help right away if:  Your chest hurts.  You have trouble breathing in enough air.  You have an uncomfortable feeling in your back, arms, or stomach.  You have trouble talking.  One side of your body feels weak.  A part of your face is hanging down. These symptoms may be an emergency. Do not wait to see if the symptoms will go away. Get medical help right away. Call your local emergency services (911 in the [place] Do not drive yourself to the hospital. Summary  This condition affects breathing during sleep.  The most common cause is a collapsed or blocked airway.  The goal of treatment is to help you breathe normally while you sleep. This information is not intended to replace advice given to you by your health care provider. Make sure you discuss any questions you have with your health care provider. Document Released: 09/11/2008 Document Revised: 09/19/2018 Document Reviewed:  07/29/2018 Elsevier Patient Education  2020 [employer].

## 2019-06-22 ENCOUNTER — Ambulatory Visit: Payer: Self-pay | Admitting: Neurology

## 2019-07-02 ENCOUNTER — Encounter: Payer: Self-pay | Admitting: Internal Medicine

## 2019-07-02 LAB — COLOGUARD: Cologuard: NEGATIVE

## 2019-08-13 ENCOUNTER — Encounter: Payer: Self-pay | Admitting: Family Medicine

## 2019-08-13 ENCOUNTER — Ambulatory Visit (INDEPENDENT_AMBULATORY_CARE_PROVIDER_SITE_OTHER): Payer: Medicare Other | Admitting: Family Medicine

## 2019-08-13 ENCOUNTER — Other Ambulatory Visit: Payer: Self-pay

## 2019-08-13 VITALS — BP 165/77 | HR 71 | Temp 98.4°F | Ht 63.5 in | Wt 237.4 lb

## 2019-08-13 DIAGNOSIS — Z9989 Dependence on other enabling machines and devices: Secondary | ICD-10-CM | POA: Diagnosis not present

## 2019-08-13 DIAGNOSIS — G4733 Obstructive sleep apnea (adult) (pediatric): Secondary | ICD-10-CM

## 2019-08-13 NOTE — Patient Instructions (Signed)
Continue CPAP nightly and four greater then 4 hours each night  Follow up in 1 year  Sleep Apnea Sleep apnea affects breathing during sleep. It causes breathing to stop for a short time or to become shallow. It can also increase the risk of:  Heart attack.  Stroke.  Being very overweight (obese).  Diabetes.  Heart failure.  Irregular heartbeat. The goal of treatment is to help you breathe normally again. What are the causes? There are three kinds of sleep apnea:  Obstructive sleep apnea. This is caused by a blocked or collapsed airway.  Central sleep apnea. This happens when the brain does not send the right signals to the muscles that control breathing.  Mixed sleep apnea. This is a combination of obstructive and central sleep apnea. The most common cause of this condition is a collapsed or blocked airway. This can happen if:  Your throat muscles are too relaxed.  Your tongue and tonsils are too large.  You are overweight.  Your airway is too small. What increases the risk?  Being overweight.  Smoking.  Having a small airway.  Being older.  Being female.  Drinking alcohol.  Taking medicines to calm yourself (sedatives or tranquilizers).  Having family members with the condition. What are the signs or symptoms?  Trouble staying asleep.  Being sleepy or tired during the day.  Getting angry a lot.  Loud snoring.  Headaches in the morning.  Not being able to focus your mind (concentrate).  Forgetting things.  Less interest in sex.  Mood swings.  Personality changes.  Feelings of sadness (depression).  Waking up a lot during the night to pee (urinate).  Dry mouth.  Sore throat. How is this diagnosed?  Your medical history.  A physical exam.  A test that is done when you are sleeping (sleep study). The test is most often done in a sleep lab but may also be done at home. How is this treated?   Sleeping on your side.  Using a  medicine to get rid of mucus in your nose (decongestant).  Avoiding the use of alcohol, medicines to help you relax, or certain pain medicines (narcotics).  Losing weight, if needed.  Changing your diet.  Not smoking.  Using a machine to open your airway while you sleep, such as: ? An oral appliance. This is a mouthpiece that shifts your lower jaw forward. ? A CPAP device. This device blows air through a mask when you breathe out (exhale). ? An EPAP device. This has valves that you put in each nostril. ? A BPAP device. This device blows air through a mask when you breathe in (inhale) and breathe out.  Having surgery if other treatments do not work. It is important to get treatment for sleep apnea. Without treatment, it can lead to:  High blood pressure.  Coronary artery disease.  In men, not being able to have an erection (impotence).  Reduced thinking ability. Follow these instructions at home: Lifestyle  Make changes that your doctor recommends.  Eat a healthy diet.  Lose weight if needed.  Avoid alcohol, medicines to help you relax, and some pain medicines.  Do not use any products that contain nicotine or tobacco, such as cigarettes, e-cigarettes, and chewing tobacco. If you need help quitting, ask your doctor. General instructions  Take over-the-counter and prescription medicines only as told by your doctor.  If you were given a machine to use while you sleep, use it only as told by your  doctor.  If you are having surgery, make sure to tell your doctor you have sleep apnea. You may need to bring your device with you.  Keep all follow-up visits as told by your doctor. This is important. Contact a doctor if:  The machine that you were given to use during sleep bothers you or does not seem to be working.  You do not get better.  You get worse. Get help right away if:  Your chest hurts.  You have trouble breathing in enough air.  You have an uncomfortable  feeling in your back, arms, or stomach.  You have trouble talking.  One side of your body feels weak.  A part of your face is hanging down. These symptoms may be an emergency. Do not wait to see if the symptoms will go away. Get medical help right away. Call your local emergency services (911 in the [place] Do not drive yourself to the hospital. Summary  This condition affects breathing during sleep.  The most common cause is a collapsed or blocked airway.  The goal of treatment is to help you breathe normally while you sleep. This information is not intended to replace advice given to you by your health care provider. Make sure you discuss any questions you have with your health care provider. Document Released: 09/11/2008 Document Revised: 09/19/2018 Document Reviewed: 07/29/2018 Elsevier Patient Education  2020 [employer].

## 2019-08-13 NOTE — Progress Notes (Signed)
PATIENT: Anita Blake DOB: 11/05/1946  REASON FOR VISIT: follow up HISTORY FROM: patient  Chief Complaint  Patient presents with   Follow-up    Rm 7, alone, DME [facility]   cpap     HISTORY OF PRESENT ILLNESS: Today 08/13/19 Anita Blake is a 73 y.o. female here today for follow up of OSA on CPAP.  She reports that she has been doing much better on CPAP therapy.  She is able to tolerate therapy.  She has noted that she is sleeping better.  She is not waking to urinate in the middle the night any longer.  She received new supplies last week.  Compliance report reveals that she has used CPAP 29 of the last 30 days for compliance of 97%.  28 of these days she used CPAP greater than 4 hours for compliance of 93%.  Average usage was 7 hours and 47 minutes.  AHI was 1.3 on 5 to 12 cm of water and EPR of 3.  There was no significant leak.  HISTORY: (copied from my note on 06/18/2019)  Anita Blake is a 73 y.o. female here today for follow up for OSA on CPAP.  Anita Blake returns today for initial CPAP compliance report.  She reports some obstacles in her way of using CPAP nightly.  She has been concerned about daily cleaning process.  She states that she did not have time in her day to clean her machine every day.  If she is unable to clean the machine she will not use CPAP.  She also states that her husband is ill.  Upon nights where he is not feeling well she is up and down so much she does not feel that it is useful to use CPAP.  She also has a dog.  She reports that he does not sleep well if he is not wearing his pajamas.  We have reviewed her compliance report dated 05/18/2019 through 06/16/2019.  She has used CPAP 15 out of the last 30 days for compliance of 50%.  13 of those days were greater than 4 hours for compliance of 43%.  AHI was 7.9 on 5 to 12 cm of water.  There was a mild leak in the 95th percentile of 21.1.  She does note that when she is using CPAP it helps significantly with  GERD.  She also sleeps better at night.  She wakes less frequently.  HISTORY: (copied from Dr Dohmeier's note on 01/19/2019)  Anita Blake a 73 y.o.femalepatient, seenon 02-03-2020in a referral from Dr. Everardo All evaluation of fragmented sleep, daytime fatigue and sleepiness, in the setting of anemia and possibly apnea.  Chief complaint according to patient :pt states that all her life she has slept in 2 hr increments. she has been told she snores in sleep. pt has never had a sleep study. pt complaints of fatigue during the day. she states that she always felt like she pushes herself to stay awake. PCP sent her because Anita Blake the energy level had been severely decreased. recently started iron supplement due to low iron and she has felt that has gotten better.  Sleep habits are as follows: The patient adheres to a bedtime of 11.30 PM, and goes to sleep within 30 minutes - She describes her marital bedroom as cool, quiet and dark. She sleeps on her sides, but often changes position - she has an adjustable bed and sleeps on 1 or 2 pillows.  Her husband has noted  her to snore. She reports very vivid dreams, has one nocturia and will continue her last dream. Seems to dream every night. She also has hiatal hernia GERD causing some sleep interruption. No headaches, she wakes up every 2 hours , looks at the clock. She wakes at 4. 14 and 6. 31 AM , she rises between 7.31 and 8 .20 AM. She never used an alarm! " I Have never overslept" .   Sleep medical history: hip replacement and knee problem have caused her to sleep more in supine or avoiding the left side. Obesity at BMI 41. Snoring has been present for longer than a decade. NASH. Anemia with borderline iron levels.   Familymedical/sleep history:mother died at 40 of ESRD, father at age 47 of CVA>  Social history:married, never smoked, none drinker, caffeine - none - no tea, coffee, soda.  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  Epworth sleepiness scale: 1 Fatigue severity scale: 9  ALLERGIES: Allergies  Allergen Reactions   Anesthetics, Halogenated     Difficulty with waking up from anesthesia from THR, had aphasia, negative CT   Crestor [Rosuvastatin]    Lopid [Gemfibrozil]    Latex Rash and Swelling    [Rash] redness/swelling/rash    HOME MEDICATIONS: Outpatient Medications Prior to Visit  Medication Sig Dispense Refill   aspirin 81 MG tablet Take 81 mg by mouth daily.     Cholecalciferol (VITAMIN D PO) Take 10,000 Int'l Units by mouth daily.      DICYCLOMINE HCL PO Take 10 mg by mouth. Take up to 4 tablets prn for abdominal pain and cramping.     famotidine (PEPCID) 40 MG tablet Take 1 tablet (40 mg total) by mouth daily as needed for heartburn or indigestion. 180 tablet 1   LIVALO 4 MG TABS TAKE 1 TABLET BY MOUTH AT BEDTIME (Patient taking differently: Take 1/2 tablet daily) 90 tablet 1   MAGNESIUM PO Take 500 mg by mouth.      No facility-administered medications prior to visit.     PAST MEDICAL HISTORY: Past Medical History:  Diagnosis Date   Anemia yrs ago   Arthritis    Complication of anesthesia    slow to wake up   GERD (gastroesophageal reflux disease)    HLD (hyperlipidemia)    Hyperlipidemia    Labile hypertension    no meds   Nonalcoholic steatohepatitis (NASH)    Prediabetes    Vitamin D deficiency     PAST SURGICAL HISTORY: Past Surgical History:  Procedure Laterality Date   ABDOMINAL HYSTERECTOMY     partial, ovaries remain   cataract surgery Left    EYE SURGERY Left 2007   macular hole in retina   index finger surgery Right    right 5th digit  finger - cyst removal Right 06/04/2019   Dr. Apolonio Schneiders   right arm elbow surgery     ligament repair   TOTAL HIP ARTHROPLASTY Left 10/06/2013   Procedure: LEFT TOTAL HIP ARTHROPLASTY ANTERIOR APPROACH;  Surgeon: Mauri Pole, MD;  Location: [facility];  Service: Orthopedics;  Laterality: Left;    FAMILY HISTORY: Family History  Problem Relation Age of Onset   Heart disease Mother  Vague   Kidney failure Mother    Stroke Father    Alzheimer's disease Other     SOCIAL HISTORY: Social History   Socioeconomic History   Marital status: Married    Spouse name: Not on file   Number of children: 1   Years of education: Not on file   Highest education level: Not on file  Occupational History   Occupation: Retired  Scientist, product/process development strain: Not on file   Food insecurity    Worry: Not on file    Inability: Not on Lexicographer needs    Medical: Not on file    Non-medical: Not on file  Tobacco Use   Smoking status: Never Smoker   Smokeless tobacco: Never Used  Substance and Sexual Activity   Alcohol use: No   Drug use: No   Sexual activity: Not Currently  Lifestyle   Physical activity    Days per week: 7 days    Minutes per session: 30 min   Stress: Only a little  Relationships   Press photographer on phone: Not on file    Gets together: Not on file    Attends religious service: Not on file    Active member of club or organization: Not on file    Attends meetings of clubs or organizations: Not on file    Relationship status: Not on file   Intimate partner violence    Fear of current or ex partner: Not on file    Emotionally abused: Not on file    Physically abused: Not on file    Forced sexual activity: Not on file  Other Topics Concern   Not on file  Social History Narrative   Lives with husband.        PHYSICAL EXAM  Vitals:   08/13/19 1442  BP: (!) 165/77  Pulse: 71  Temp: 98.4 F (36.9 C)    Weight: 237 lb 6.4 oz (107.7 kg)  Height: 5' 3.5" (1.613 m)   Body mass index is 41.39 kg/m.  Generalized: Well developed, in no acute distress  Cardiology: normal rate and rhythm, no murmur noted Respiratory: Clear to auscultation bilaterally Neurological examination  Mentation: Alert oriented to time, place, history taking. Follows all commands speech and language fluent Cranial nerve II-XII: Pupils were equal round reactive to light. Extraocular movements were full, visual field were full on confrontational test.  Motor: The motor testing reveals 5 over 5 strength of all 4 extremities. Good symmetric motor tone is noted throughout.   Gait and station: Gait is normal.   DIAGNOSTIC DATA (LABS, IMAGING, TESTING) - I reviewed patient records, labs, notes, testing and imaging myself where available.  No flowsheet data found.   Lab Results  Component Value Date   WBC 6.8 06/08/2019   HGB 11.7 06/08/2019   HCT 36.5 06/08/2019   MCV 79.9 (L) 06/08/2019   PLT 271 06/08/2019      Component Value Date/Time   NA 140 06/08/2019 1217   K 4.3 06/08/2019 1217   CL 102 06/08/2019 1217   CO2 30 06/08/2019 1217   GLUCOSE 84 06/08/2019 1217   BUN 13 06/08/2019 1217   CREATININE 0.67 06/08/2019 1217   CALCIUM 9.8 06/08/2019 1217   PROT 7.0 06/08/2019 1217   ALBUMIN 4.0 05/16/2017 1033   AST 27 06/08/2019 1217   ALT 26 06/08/2019 1217   ALKPHOS 62 05/16/2017 1033   BILITOT 0.6 06/08/2019 1217  GFRNONAA 89 06/08/2019 1217   GFRAA 103 06/08/2019 1217   Lab Results  Component Value Date   CHOL 180 06/08/2019   HDL 59 06/08/2019   LDLCALC 100 (H) 06/08/2019   TRIG 117 06/08/2019   CHOLHDL 3.1 06/08/2019   Lab Results  Component Value Date   HGBA1C 5.9 (H) 06/08/2019   Lab Results  Component Value Date   [id number] 812 05/27/2018   Lab Results  Component Value Date   TSH 1.70 06/08/2019       ASSESSMENT AND PLAN 73 y.o. year old female  has a past medical history of  Anemia (yrs ago), Arthritis, Complication of anesthesia, GERD (gastroesophageal reflux disease), HLD (hyperlipidemia), Hyperlipidemia, Labile hypertension, Nonalcoholic steatohepatitis (NASH), Prediabetes, and Vitamin D deficiency. here with     ICD-10-CM   1. OSA on CPAP  G47.33    Z99.89     Anita Blake is doing much better with CPAP compliance.  Compliance report reveals optimal compliance.  She was encouraged to continue using CPAP nightly for greater than 4 hours each night.  She has recently received supplies.  She will follow-up with [place] in 1 year, sooner if needed.  She verbalizes understanding and agreement with this plan.   No orders of the defined types were placed in this encounter.    No orders of the defined types were placed in this encounter.     I spent 15 minutes with the patient. 50% of this time was spent counseling and educating patient on plan of care and medications.    Debbora Presto, FNP-C 08/13/2019, 3:02 PM [facility] [address] [place], [place] [address] [phone]

## 2019-09-02 ENCOUNTER — Other Ambulatory Visit: Payer: Self-pay | Admitting: Internal Medicine

## 2019-09-02 MED ORDER — BISOPROLOL-HYDROCHLOROTHIAZIDE 5-6.25 MG PO TABS: ORAL_TABLET | ORAL | 0 refills | Status: DC

## 2019-09-07 ENCOUNTER — Encounter: Payer: Self-pay | Admitting: Internal Medicine

## 2019-09-07 NOTE — Progress Notes (Signed)
History of Present Illness:      This very nice 73 y.o. MBF presents for 3 month follow up with HTN, HLD, T2_NIDDM and Vitamin D Deficiency.  Patient has GERD controlled with diet & her meds.      Patient is followed  for HTN (Sept 2020)  & BP has not been controlled at home.  Recent BP's have been elevated in the range  - 145-160's/80-95 and patient reports fatigue. Today's BP is borderline elevated - 136/90. Patient has had no complaints of any cardiac type chest pain, palpitations, dyspnea / orthopnea / PND, dizziness, claudication, or dependent edema.      Hyperlipidemia is controlled with diet diet & Livelo. Patient denies myalgias or other med SE's. Last Lipids were near goal:  Lab Results  Component Value Date   CHOL 180 06/08/2019   HDL 59 06/08/2019   LDLCALC 100 (H) 06/08/2019   TRIG 117 06/08/2019   CHOLHDL 3.1 06/08/2019        Also, the patient has Morbid Obesity (BMI 40+) and history of T2_NIDDM  (A1c 6.5% / Apr 2015) attempting control with diet and has had no symptoms of reactive hypoglycemia, diabetic polys, paresthesias or visual blurring.  Last A1c was near goal:  Lab Results  Component Value Date   HGBA1C 5.9 (H) 06/08/2019       Further, the patient also has history of Vitamin D Deficiency ("41" / 2016)  and supplements vitamin D without any suspected side-effects. Last vitamin D was at goal:   Lab Results  Component Value Date   VD25OH 76 06/08/2019   Current Outpatient Medications on File Prior to Visit  Medication Sig  . aspirin 81 MG tablet Take 81 mg by mouth daily.  . Cholecalciferol (VITAMIN D PO) Take 10,000 Int'l Units by mouth daily.   Marland Kitchen DICYCLOMINE HCL PO Take 10 mg by mouth. Take up to 4 tablets prn for abdominal pain and cramping.  . famotidine (PEPCID) 40 MG tablet Take 1 tablet (40 mg total) by mouth daily as needed for heartburn or indigestion.  Marland Kitchen LIVALO 4 MG TABS TAKE 1 TABLET BY MOUTH AT BEDTIME (Patient taking differently: Take 1/2  tablet daily)  . MAGNESIUM PO Take 500 mg by mouth.   . bisoprolol-hydrochlorothiazide (ZIAC) 5-6.25 MG tablet Take 1 tablet every morning  for BP (Patient not taking: Reported on 09/08/2019)   No current facility-administered medications on file prior to visit.     Allergies  Allergen Reactions  . Anesthetics, Halogenated     Difficulty with waking up from anesthesia from [facility], had aphasia, negative CT  . Crestor [Rosuvastatin]   . Lopid [Gemfibrozil]   . Latex Rash and Swelling    [Rash] redness/swelling/rash    PMHx:   Past Medical History:  Diagnosis Date  . Anemia yrs ago  . Arthritis   . Complication of anesthesia    slow to wake up  . GERD (gastroesophageal reflux disease)   . HLD (hyperlipidemia)   . Hyperlipidemia   . Labile hypertension    no meds  . Nonalcoholic steatohepatitis (NASH)   . Prediabetes   . Vitamin D deficiency    Immunization History  Administered Date(s) Administered  . DT 05/10/2015  . Td 07/27/2005   Past Surgical History:  Procedure Laterality Date  . ABDOMINAL HYSTERECTOMY     partial, ovaries remain  . cataract surgery Left   . EYE SURGERY Left 2007   macular hole in retina  .  index finger surgery Right   . right 5th digit finger - cyst removal Right 06/04/2019   Dr. Apolonio Schneiders  . right arm elbow surgery     ligament repair  . TOTAL HIP ARTHROPLASTY Left 10/06/2013   Procedure: LEFT TOTAL HIP ARTHROPLASTY ANTERIOR APPROACH;  Surgeon: Mauri Pole, MD;  Location: [facility];  Service: Orthopedics;  Laterality: Left;    FHx:    Reviewed / unchanged  SHx:    Reviewed / unchanged   Systems Review:  Constitutional: Denies fever, chills, wt changes, headaches, insomnia, fatigue, night sweats, change in appetite. Eyes: Denies redness, blurred vision, diplopia, discharge, itchy, watery eyes.  ENT: Denies discharge, congestion, post nasal drip, epistaxis, sore throat, earache, hearing loss, dental pain, tinnitus, vertigo, sinus pain,  snoring.  CV: Denies chest pain, palpitations, irregular heartbeat, syncope, dyspnea, diaphoresis, orthopnea, PND, claudication or edema. Respiratory: denies cough, dyspnea, DOE, pleurisy, hoarseness, laryngitis, wheezing.  Gastrointestinal: Denies dysphagia, odynophagia, heartburn, reflux, water brash, abdominal pain or cramps, nausea, vomiting, bloating, diarrhea, constipation, hematemesis, melena, hematochezia  or hemorrhoids. Genitourinary: Denies dysuria, frequency, urgency, nocturia, hesitancy, discharge, hematuria or flank pain. Musculoskeletal: Denies arthralgias, myalgias, stiffness, jt. swelling, pain, limping or strain/sprain.  Skin: Denies pruritus, rash, hives, warts, acne, eczema or change in skin lesion(s). Neuro: No weakness, tremor, incoordination, spasms, paresthesia or pain. Psychiatric: Denies confusion, memory loss or sensory loss. Endo: Denies change in weight, skin or hair change.  Heme/Lymph: No excessive bleeding, bruising or enlarged lymph nodes.  Physical Exam  BP 136/90   Pulse 63   Temp 97.9 F (36.6 C)   Ht 5' 3.5" (1.613 m)   Wt 234 lb 9.6 oz (106.4 kg)   SpO2 97%   BMI 40.91 kg/m   Appears  OVER nourished and in no distress.  Eyes: PERRLA, EOMs, conjunctiva no swelling or erythema. Sinuses: No frontal/maxillary tenderness ENT/Mouth: EAC's clear, TM's nl w/o erythema, bulging. Nares clear w/o erythema, swelling, exudates. Oropharynx clear without erythema or exudates. Oral hygiene is good. Tongue normal, non obstructing. Hearing intact.  Neck: Supple. Thyroid not palpable. Car 2+/2+ without bruits, nodes or JVD. Chest: Respirations nl with BS clear & equal w/o rales, rhonchi, wheezing or stridor.  Cor: Heart sounds normal w/ regular rate and rhythm without sig. murmurs, gallops, clicks or rubs. Peripheral pulses normal and equal  without edema.  Abdomen: Soft & bowel sounds normal. Non-tender w/o guarding, rebound, hernias, masses or organomegaly.   Lymphatics: Unremarkable.  Musculoskeletal: Full ROM all peripheral extremities, joint stability, 5/5 strength and normal gait.  Skin: Warm, dry without exposed rashes, lesions or ecchymosis apparent.  Neuro: Cranial nerves intact, reflexes equal bilaterally. Sensory-motor testing grossly intact. Tendon reflexes grossly intact.  Pysch: Alert & oriented x 3.  Insight and judgement nl & appropriate. No ideations.  Assessment and Plan:  1. Essential hypertension  - D/C Ziac & start  - Rx Hyzaar 100/12.5 - 1 tablet Daily  -  monitor blood pressure at home & ROV 2 weeks w/ BP List  - Continue DASH diet.  Reminder to go to the ER if any CP,  SOB, nausea, dizziness, severe HA, changes vision/speech.  - CBC with Differential/Platelet - COMPLETE METABOLIC PANEL WITH GFR - Magnesium - TSH  2. Hyperlipidemia associated with type 2 diabetes mellitus ([place])  - Continue diet/meds, exercise,& lifestyle modifications.  - Continue monitor periodic cholesterol/liver & renal functions   - Lipid panel - TSH  3. Diabetes mellitus without complication ([place])  - Continue diet, exercise  -  Lifestyle modifications.  - Monitor appropriate labs.  - Hemoglobin A1c - Insulin, random  4. Vitamin D deficiency  - Continue supplementation.  - VITAMIN D 25 Hydroxyl  5. Class 3 severe obesity due to excess calories with serious comorbidity and body mass index (BMI) of 40.0 to 44.9 in adult ([place])   6. Medication management  - CBC with Differential/Platelet - COMPLETE METABOLIC PANEL WITH GFR - Magnesium - Lipid panel - TSH - Hemoglobin A1c - Insulin, random - VITAMIN D 25 Hydroxyl        Discussed  regular exercise, BP monitoring, weight control to achieve/maintain BMI less than 25 and discussed med and SE's. Recommended labs to assess and monitor clinical status with further disposition pending results of labs.  I discussed the assessment and treatment plan with the patient. The patient was  provided an opportunity to ask questions and all were answered. The patient agreed with the plan and demonstrated an understanding of the instructions.  I provided over 30 minutes of exam, counseling, chart review and  complex critical decision making.  Kirtland Bouchard, MD

## 2019-09-07 NOTE — Patient Instructions (Signed)

## 2019-09-08 ENCOUNTER — Ambulatory Visit (INDEPENDENT_AMBULATORY_CARE_PROVIDER_SITE_OTHER): Payer: Medicare Other | Admitting: Internal Medicine

## 2019-09-08 ENCOUNTER — Encounter: Payer: Self-pay | Admitting: Internal Medicine

## 2019-09-08 ENCOUNTER — Other Ambulatory Visit: Payer: Self-pay

## 2019-09-08 VITALS — BP 136/90 | HR 63 | Temp 97.9°F | Ht 63.5 in | Wt 234.6 lb

## 2019-09-08 DIAGNOSIS — E1169 Type 2 diabetes mellitus with other specified complication: Secondary | ICD-10-CM | POA: Diagnosis not present

## 2019-09-08 DIAGNOSIS — Z6841 Body Mass Index (BMI) 40.0 and over, adult: Secondary | ICD-10-CM

## 2019-09-08 DIAGNOSIS — E119 Type 2 diabetes mellitus without complications: Secondary | ICD-10-CM

## 2019-09-08 DIAGNOSIS — E559 Vitamin D deficiency, unspecified: Secondary | ICD-10-CM

## 2019-09-08 DIAGNOSIS — I10 Essential (primary) hypertension: Secondary | ICD-10-CM | POA: Diagnosis not present

## 2019-09-08 DIAGNOSIS — E785 Hyperlipidemia, unspecified: Secondary | ICD-10-CM

## 2019-09-08 DIAGNOSIS — Z79899 Other long term (current) drug therapy: Secondary | ICD-10-CM

## 2019-09-08 MED ORDER — LOSARTAN POTASSIUM-HCTZ 100-12.5 MG PO TABS: ORAL_TABLET | ORAL | 2 refills | Status: DC

## 2019-09-09 LAB — CBC WITH DIFFERENTIAL/PLATELET
Absolute Monocytes: 390 cells/uL (ref 200–950)
Basophils Absolute: 42 cells/uL (ref 0–200)
Basophils Relative: 0.7 %
Eosinophils Absolute: 108 cells/uL (ref 15–500)
Eosinophils Relative: 1.8 %
HCT: 36.8 % (ref 35.0–45.0)
Hemoglobin: 11.8 g/dL (ref 11.7–15.5)
Lymphs Abs: 2190 cells/uL (ref 850–3900)
MCH: 26 pg — ABNORMAL LOW (ref 27.0–33.0)
MCHC: 32.1 g/dL (ref 32.0–36.0)
MCV: 81.2 fL (ref 80.0–100.0)
MPV: 11.2 fL (ref 7.5–12.5)
Monocytes Relative: 6.5 %
Neutro Abs: 3270 cells/uL (ref 1500–7800)
Neutrophils Relative %: 54.5 %
Platelets: 255 10*3/uL (ref 140–400)
RBC: 4.53 10*6/uL (ref 3.80–5.10)
RDW: 14.3 % (ref 11.0–15.0)
Total Lymphocyte: 36.5 %
WBC: 6 10*3/uL (ref 3.8–10.8)

## 2019-09-09 LAB — COMPLETE METABOLIC PANEL WITH GFR
AG Ratio: 1.5 (calc) (ref 1.0–2.5)
ALT: 26 U/L (ref 6–29)
AST: 26 U/L (ref 10–35)
Albumin: 4.1 g/dL (ref 3.6–5.1)
Alkaline phosphatase (APISO): 55 U/L (ref 37–153)
BUN: 14 mg/dL (ref 7–25)
CO2: 30 mmol/L (ref 20–32)
Calcium: 9.6 mg/dL (ref 8.6–10.4)
Chloride: 103 mmol/L (ref 98–110)
Creat: 0.72 mg/dL (ref 0.60–0.93)
GFR, Est African American: 98 mL/min/{1.73_m2} (ref >=60)
GFR, Est Non African American: 84 mL/min/{1.73_m2} (ref >=60)
Globulin: 2.8 g/dL (calc) (ref 1.9–3.7)
Glucose, Bld: 89 mg/dL (ref 65–99)
Potassium: 4.3 mmol/L (ref 3.5–5.3)
Sodium: 142 mmol/L (ref 135–146)
Total Bilirubin: 0.8 mg/dL (ref 0.2–1.2)
Total Protein: 6.9 g/dL (ref 6.1–8.1)

## 2019-09-09 LAB — HEMOGLOBIN A1C
Hgb A1c MFr Bld: 5.8 % of total Hgb — ABNORMAL HIGH (ref <5.7)
Mean Plasma Glucose: 120 (calc)
eAG (mmol/L): 6.6 (calc)

## 2019-09-09 LAB — INSULIN, RANDOM: Insulin: 5.9 u[IU]/mL

## 2019-09-09 LAB — LIPID PANEL
Cholesterol: 166 mg/dL (ref <200)
HDL: 60 mg/dL (ref >=50)
LDL Cholesterol (Calc): 86 mg/dL (calc)
Non-HDL Cholesterol (Calc): 106 mg/dL (calc) (ref <130)
Total CHOL/HDL Ratio: 2.8 (calc) (ref <5.0)
Triglycerides: 105 mg/dL (ref <150)

## 2019-09-09 LAB — VITAMIN D 25 HYDROXY (VIT D DEFICIENCY, FRACTURES): Vit D, 25-Hydroxy: 72 ng/mL (ref 30–100)

## 2019-09-09 LAB — TSH: TSH: 1.72 mIU/L (ref 0.40–4.50)

## 2019-09-09 LAB — MAGNESIUM: Magnesium: 1.8 mg/dL (ref 1.5–2.5)

## 2019-09-21 NOTE — Progress Notes (Signed)
   History of Present Illness:   Patient is a very nice 73 yo MBF who returns for 2 week f/u of elevated BP not controlled off  Ziac which she d/c'd due to c/o fatigue. Previously, BP's had been the range off meds of 145-160's/80-95. At last OV she was started on  Hyzaar 100/12.5. Patient relates that she is feeling better & home BP's have been in normal range.   Medications  .  LIVALO 4 MG TABS, TAKE 1 TABLET  AT BEDTIME (Patient taking differently: Take 1/2 tablet daily) .  losartan-hydrochlorothiazide (HYZAAR) 100-12.5 MG tablet, Take 1 tablet every Morning for BP .  aspirin 81 MG tablet, Take 81 mg  daily. Marland Kitchen  VITAMIN D, Take 10,000 Int'l Units  daily.  Marland Kitchen  DICYCLOMINE, Take 10 mg by mouth. Take up to 4 tablets prn for abdominal pain and cramping. .  famotidine  40 MG tablet, Take 1 tablet  daily as needed for heartburn or indigestion. Marland Kitchen  MAGNESIUM PO, Take Daily   Problem list She has Morbid obesity (BMI 42.79); Labile hypertension; Hyperlipidemia; Hiatal hernia with GERD; Vitamin D deficiency; NASH (nonalcoholic steatohepatitis); Medication management; Macular hole; Cataract; Other abnormal glucose (prediabetes); and OSA on CPAP on their problem list.   Observations/Objective:  BP 136/76   Pulse 68   Temp (!) 97.4 F (36.3 C)   Resp 16   Ht 5' 3.5" (1.613 m)   Wt 236 lb 6.4 oz (107.2 kg)   BMI 41.22 kg/m   HEENT - WNL. Neck - supple.  Chest - Clear equal BS. Cor - Nl HS. RRR w/o sig MGR. PP 1(+). No edema. MS- FROM w/o deformities.  Gait Nl. Neuro -  Nl w/o focal abnormalities.  Assessment and Plan:  1. Labile hypertension  - discussed meds& SE's and advised continue BP Monitoring  I discussed the assessment and treatment plan with the patient. The patient was provided an opportunity to ask questions and all were answered. The patient agreed with the plan and demonstrated an understanding of the instructions.    Kirtland Bouchard, MD

## 2019-09-22 ENCOUNTER — Ambulatory Visit (INDEPENDENT_AMBULATORY_CARE_PROVIDER_SITE_OTHER): Payer: Medicare Other | Admitting: Internal Medicine

## 2019-09-22 ENCOUNTER — Other Ambulatory Visit: Payer: Self-pay

## 2019-09-22 VITALS — BP 136/76 | HR 68 | Temp 97.4°F | Resp 16 | Ht 63.5 in | Wt 236.4 lb

## 2019-09-22 DIAGNOSIS — R0989 Other specified symptoms and signs involving the circulatory and respiratory systems: Secondary | ICD-10-CM

## 2019-09-26 ENCOUNTER — Encounter: Payer: Self-pay | Admitting: Internal Medicine

## 2019-10-06 ENCOUNTER — Other Ambulatory Visit: Payer: Self-pay | Admitting: Obstetrics and Gynecology

## 2019-10-06 DIAGNOSIS — Z9189 Other specified personal risk factors, not elsewhere classified: Secondary | ICD-10-CM

## 2019-10-07 LAB — HM MAMMOGRAPHY: HM Mammogram: NORMAL (ref 0–4)

## 2019-10-08 ENCOUNTER — Encounter: Payer: Self-pay | Admitting: Internal Medicine

## 2019-10-20 ENCOUNTER — Encounter: Payer: Self-pay | Admitting: Internal Medicine

## 2019-11-02 ENCOUNTER — Other Ambulatory Visit: Payer: Self-pay | Admitting: Obstetrics and Gynecology

## 2019-11-02 DIAGNOSIS — R928 Other abnormal and inconclusive findings on diagnostic imaging of breast: Secondary | ICD-10-CM

## 2019-11-26 ENCOUNTER — Other Ambulatory Visit: Payer: Self-pay

## 2019-11-26 ENCOUNTER — Ambulatory Visit
Admission: RE | Admit: 2019-11-26 | Discharge: 2019-11-26 | Disposition: A | Discharge status: Home / Self Care | Payer: Medicare Other | Source: Ambulatory Visit | Attending: Obstetrics and Gynecology | Admitting: Obstetrics and Gynecology

## 2019-11-26 DIAGNOSIS — R928 Other abnormal and inconclusive findings on diagnostic imaging of breast: Secondary | ICD-10-CM

## 2019-12-06 NOTE — Progress Notes (Signed)
3 MONTH FOLLOW UP  Assessment:   Labile hypertension - continue medications, DASH diet, exercise and monitor at home. Call if greater than 130/80.  - CBC with Differential/Platelet - BASIC METABOLIC PANEL WITH GFR - Hepatic function panel - TSH  Prediabetes Discussed general issues about diabetes pathophysiology and management., Educational material distributed., Suggested low cholesterol diet., Encouraged aerobic exercise., Discussed foot care., Reminded to get yearly retinal exam. - Hemoglobin A1c  Hyperlipidemia -continue medications, check lipids, decrease fatty foods, increase activity.  - Lipid panel  Morbid obesity Obesity with co morbidities- long discussion about weight loss, diet, and exercise  Medication management - Magnesium  NASH (nonalcoholic steatohepatitis) Check labs, avoid tylenol, alcohol, weight loss advised.    Future Appointments  Date Time Provider [place]  03/08/2020  3:00 PM [name], PA-C [facility] None  06/09/2020 10:00 AM Liane Comber, NP [facility] None  08/15/2020  2:30 PM Lomax, Amy, NP [facility] None     Subjective:  Anita Blake is a 73 y.o. AAF presents for 3 month follow up.    Her blood pressure has been controlled at home she is on 1/2 of the hyzaar a day, today their BP is BP: 122/80 She does workout.  She denies chest pain, SOB, dizziness.   She is on cholesterol medication, she is on 1/2 tab of livalo.  Her cholesterol is at goal. The cholesterol last visit was:   Lab Results  Component Value Date   CHOL 166 09/08/2019   HDL 60 09/08/2019   LDLCALC 86 09/08/2019   TRIG 105 09/08/2019   CHOLHDL 2.8 09/08/2019    She has been working on diet and exercise for prediabetes, and denies paresthesia of the feet, polydipsia, polyuria and visual disturbances. Last A1C in the office was:  Lab Results  Component Value Date   HGBA1C 5.8 (H) 09/08/2019   Patient is on Vitamin D supplement.   Lab Results  Component  Value Date   VD25OH 72 09/08/2019     BMI is Body mass index is 41.08 kg/m., she is working on diet and exercise.   She does have a history of NASH. Had + sleep study with Dr. Brett Fairy, she is wearing her CPAP nightly and states it is helping with her fatigue, she has been on it since June. She is  Wt Readings from Last 3 Encounters:  12/08/19 235 lb 9.6 oz (106.9 kg)  09/22/19 236 lb 6.4 oz (107.2 kg)  09/08/19 234 lb 9.6 oz (106.4 kg)    Medication Review: Current Outpatient Medications on File Prior to Visit  Medication Sig Dispense Refill  . aspirin 81 MG tablet Take 81 mg by mouth daily.    . Cholecalciferol (VITAMIN D PO) Take 10,000 Int'l Units by mouth daily.     Marland Kitchen DICYCLOMINE HCL PO Take 10 mg by mouth. Take up to 4 tablets prn for abdominal pain and cramping.    . famotidine (PEPCID) 40 MG tablet Take 1 tablet (40 mg total) by mouth daily as needed for heartburn or indigestion. 180 tablet 1  . LIVALO 4 MG TABS TAKE 1 TABLET BY MOUTH AT BEDTIME (Patient taking differently: Take 1/2 tablet daily) 90 tablet 1  . losartan-hydrochlorothiazide (HYZAAR) 100-12.5 MG tablet Take 1 tablet every Morning for BP 30 tablet 2  . MAGNESIUM PO Take 500 mg by mouth.      No current facility-administered medications on file prior to visit.    Current Problems (verified) Patient Active Problem List  Diagnosis Date Noted  . OSA on CPAP 06/18/2019  . Other abnormal glucose (prediabetes) 11/18/2017  . Medication management 09/07/2015  . NASH (nonalcoholic steatohepatitis) 05/10/2015  . Vitamin D deficiency 01/11/2014  . Labile hypertension   . Hyperlipidemia   . Hiatal hernia with GERD   . Morbid obesity (BMI 42.79) 10/07/2013  . Cataract 10/30/2012  . Macular hole 10/25/2011   Review of Systems  Constitutional: Negative.   HENT: Negative.  Negative for congestion.   Eyes: Negative.   Respiratory: Negative.  Negative for cough, hemoptysis, sputum production, shortness of breath and  wheezing.   Cardiovascular: Negative.   Gastrointestinal: Negative.   Genitourinary: Negative.   Musculoskeletal: Negative.   Skin: Negative.   Neurological: Negative.   Endo/Heme/Allergies: Negative.   Psychiatric/Behavioral: Negative.       Objective:     Blood pressure 122/80, pulse 62, temperature (!) 97.3 F (36.3 C), weight 235 lb 9.6 oz (106.9 kg), SpO2 99 %. Body mass index is 41.08 kg/m.  General appearance: alert, no distress, WD/WN, female HEENT: normocephalic, sclerae anicteric, TMs pearly, nares patent, no discharge or erythema, pharynx normal Oral cavity: MMM, no lesions Neck: supple, no lymphadenopathy, no thyromegaly, no masses Heart: RRR, normal S1, S2, no murmurs Lungs: CTA bilaterally, no wheezes, rhonchi, or rales Abdomen: +bs, soft, obese, non tender, non distended, no masses, no hepatomegaly, no splenomegaly Musculoskeletal: nontender, no swelling, no obvious deformity Extremities: no edema, no cyanosis, no clubbing Pulses: 2+ symmetric, upper and lower extremities, normal cap refill Neurological: alert, oriented x 3, CN2-12 intact, strength normal upper extremities and lower extremities, sensation normal throughout, DTRs 2+ throughout, no cerebellar signs, gait normal Psychiatric: normal affect, behavior normal, pleasant  Skin: no abnormal nevus, rashes, or lesions.     [name], PA-C   12/08/2019

## 2019-12-08 ENCOUNTER — Encounter: Payer: Self-pay | Admitting: Physician Assistant

## 2019-12-08 ENCOUNTER — Other Ambulatory Visit: Payer: Self-pay

## 2019-12-08 ENCOUNTER — Ambulatory Visit (INDEPENDENT_AMBULATORY_CARE_PROVIDER_SITE_OTHER): Payer: Medicare Other | Admitting: Physician Assistant

## 2019-12-08 VITALS — BP 122/80 | HR 62 | Temp 97.3°F | Wt 235.6 lb

## 2019-12-08 DIAGNOSIS — Z79899 Other long term (current) drug therapy: Secondary | ICD-10-CM | POA: Diagnosis not present

## 2019-12-08 DIAGNOSIS — E785 Hyperlipidemia, unspecified: Secondary | ICD-10-CM | POA: Diagnosis not present

## 2019-12-08 DIAGNOSIS — E559 Vitamin D deficiency, unspecified: Secondary | ICD-10-CM

## 2019-12-08 DIAGNOSIS — K7581 Nonalcoholic steatohepatitis (NASH): Secondary | ICD-10-CM

## 2019-12-08 DIAGNOSIS — R7309 Other abnormal glucose: Secondary | ICD-10-CM

## 2019-12-08 DIAGNOSIS — R0989 Other specified symptoms and signs involving the circulatory and respiratory systems: Secondary | ICD-10-CM | POA: Diagnosis not present

## 2019-12-08 NOTE — Patient Instructions (Addendum)
General eating tips  What to Avoid . Avoid added sugars o Often added sugar can be found in processed foods such as many condiments, dry cereals, cakes, cookies, chips, crisps, crackers, candies, sweetened drinks, etc.  o Read labels and AVOID/DECREASE use of foods with the following in their ingredient list: Sugar, fructose, high fructose corn syrup, sucrose, glucose, maltose, dextrose, molasses, cane sugar, brown sugar, any type of syrup, agave nectar, etc.   . Avoid snacking in between meals- drink water or if you feel you need a snack, pick a high water content snack such as cucumbers, watermelon, or any veggie.  Marland Kitchen Avoid foods made with flour o If you are going to eat food made with flour, choose those made with whole-grains; and, minimize your consumption as much as is tolerable . Avoid processed foods o These foods are generally stocked in the middle of the grocery store.  o Focus on shopping on the perimeter of the grocery.  What to Include . Vegetables o GREEN LEAFY VEGETABLES: Kale, spinach, mustard greens, collard greens, cabbage, broccoli, etc. o OTHER: Asparagus, cauliflower, eggplant, carrots, peas, Brussel sprouts, tomatoes, bell peppers, zucchini, beets, cucumbers, etc. . Grains, seeds, and legumes o Beans: kidney beans, black eyed peas, garbanzo beans, black beans, pinto beans, etc. o Whole, unrefined grains: brown rice, barley, bulgur, oatmeal, etc. . Healthy fats  o Avoid highly processed fats such as vegetable oil o Examples of healthy fats: avocado, olives, virgin olive oil, dark chocolate (?72% Cocoa), nuts (peanuts, almonds, walnuts, cashews, pecans, etc.) o Please still do small amount of these healthy fats, they are dense in calories.  . Low - Moderate Intake of Animal Sources of Protein o Meat sources: chicken, [place], salmon, tuna. Limit to 4 ounces of meat at one time or the size of your palm. o Consider limiting dairy sources, but when choosing dairy focus on:  PLAIN [place] yogurt, cottage cheese, high-protein milk . Fruit o Choose berries  0

## 2019-12-09 LAB — CBC WITH DIFFERENTIAL/PLATELET
Absolute Monocytes: 481 cells/uL (ref 200–950)
Basophils Absolute: 33 cells/uL (ref 0–200)
Basophils Relative: 0.5 %
Eosinophils Absolute: 163 cells/uL (ref 15–500)
Eosinophils Relative: 2.5 %
HCT: 37 % (ref 35.0–45.0)
Hemoglobin: 11.8 g/dL (ref 11.7–15.5)
Lymphs Abs: 2490 cells/uL (ref 850–3900)
MCH: 25.7 pg — ABNORMAL LOW (ref 27.0–33.0)
MCHC: 31.9 g/dL — ABNORMAL LOW (ref 32.0–36.0)
MCV: 80.4 fL (ref 80.0–100.0)
MPV: 10.7 fL (ref 7.5–12.5)
Monocytes Relative: 7.4 %
Neutro Abs: 3335 cells/uL (ref 1500–7800)
Neutrophils Relative %: 51.3 %
Platelets: 266 10*3/uL (ref 140–400)
RBC: 4.6 10*6/uL (ref 3.80–5.10)
RDW: 14.4 % (ref 11.0–15.0)
Total Lymphocyte: 38.3 %
WBC: 6.5 10*3/uL (ref 3.8–10.8)

## 2019-12-09 LAB — LIPID PANEL
Cholesterol: 190 mg/dL (ref <200)
HDL: 57 mg/dL (ref >=50)
LDL Cholesterol (Calc): 108 mg/dL (calc) — ABNORMAL HIGH
Non-HDL Cholesterol (Calc): 133 mg/dL (calc) — ABNORMAL HIGH (ref <130)
Total CHOL/HDL Ratio: 3.3 (calc) (ref <5.0)
Triglycerides: 136 mg/dL (ref <150)

## 2019-12-09 LAB — COMPLETE METABOLIC PANEL WITH GFR
AG Ratio: 1.4 (calc) (ref 1.0–2.5)
ALT: 25 U/L (ref 6–29)
AST: 22 U/L (ref 10–35)
Albumin: 4.2 g/dL (ref 3.6–5.1)
Alkaline phosphatase (APISO): 56 U/L (ref 37–153)
BUN: 16 mg/dL (ref 7–25)
CO2: 33 mmol/L — ABNORMAL HIGH (ref 20–32)
Calcium: 9.9 mg/dL (ref 8.6–10.4)
Chloride: 102 mmol/L (ref 98–110)
Creat: 0.76 mg/dL (ref 0.60–0.93)
GFR, Est African American: 91 mL/min/{1.73_m2} (ref >=60)
GFR, Est Non African American: 79 mL/min/{1.73_m2} (ref >=60)
Globulin: 2.9 g/dL (calc) (ref 1.9–3.7)
Glucose, Bld: 93 mg/dL (ref 65–99)
Potassium: 4.4 mmol/L (ref 3.5–5.3)
Sodium: 142 mmol/L (ref 135–146)
Total Bilirubin: 0.9 mg/dL (ref 0.2–1.2)
Total Protein: 7.1 g/dL (ref 6.1–8.1)

## 2019-12-09 LAB — HEMOGLOBIN A1C
Hgb A1c MFr Bld: 6.1 % of total Hgb — ABNORMAL HIGH (ref <5.7)
Mean Plasma Glucose: 128 (calc)
eAG (mmol/L): 7.1 (calc)

## 2019-12-09 LAB — MAGNESIUM: Magnesium: 1.9 mg/dL (ref 1.5–2.5)

## 2019-12-09 LAB — TSH: TSH: 2.11 mIU/L (ref 0.40–4.50)

## 2019-12-13 MED ORDER — LIVALO 4 MG PO TABS: 1.0000 | ORAL_TABLET | Freq: Every day | ORAL | 1 refills | Status: DC

## 2019-12-16 ENCOUNTER — Other Ambulatory Visit: Payer: Self-pay | Admitting: Internal Medicine

## 2019-12-16 DIAGNOSIS — I10 Essential (primary) hypertension: Secondary | ICD-10-CM

## 2019-12-16 MED ORDER — LOSARTAN POTASSIUM-HCTZ 100-12.5 MG PO TABS: ORAL_TABLET | ORAL | 0 refills | Status: DC

## 2019-12-23 DIAGNOSIS — G4733 Obstructive sleep apnea (adult) (pediatric): Secondary | ICD-10-CM | POA: Diagnosis not present

## 2020-01-21 DIAGNOSIS — H2511 Age-related nuclear cataract, right eye: Secondary | ICD-10-CM | POA: Diagnosis not present

## 2020-01-21 DIAGNOSIS — H43811 Vitreous degeneration, right eye: Secondary | ICD-10-CM | POA: Diagnosis not present

## 2020-01-21 DIAGNOSIS — Z961 Presence of intraocular lens: Secondary | ICD-10-CM | POA: Diagnosis not present

## 2020-01-21 DIAGNOSIS — H35342 Macular cyst, hole, or pseudohole, left eye: Secondary | ICD-10-CM | POA: Diagnosis not present

## 2020-01-23 DIAGNOSIS — G4733 Obstructive sleep apnea (adult) (pediatric): Secondary | ICD-10-CM | POA: Diagnosis not present

## 2020-02-20 DIAGNOSIS — G4733 Obstructive sleep apnea (adult) (pediatric): Secondary | ICD-10-CM | POA: Diagnosis not present

## 2020-03-01 ENCOUNTER — Ambulatory Visit: Payer: Medicare Other | Admitting: Adult Health

## 2020-03-08 ENCOUNTER — Ambulatory Visit: Payer: Medicare Other | Admitting: Adult Health Nurse Practitioner

## 2020-03-08 ENCOUNTER — Ambulatory Visit: Payer: Medicare Other | Admitting: Physician Assistant

## 2020-03-15 NOTE — Progress Notes (Signed)
Medicare Wellness  Assessment and Plan:  Encounter for Medicare annual wellness exam 1 year  Morbid obesity (BMI 42.79) - follow up 3 months for progress monitoring - increase veggies, decrease carbs - long discussion about weight loss, diet, and exercise  Essential hypertension -     CBC with Differential/Platelet -     COMPLETE METABOLIC PANEL WITH GFR -     TSH - continue medications, DASH diet, exercise and monitor at home. Call if greater than 130/80.   Hyperlipidemia, unspecified hyperlipidemia type -     Lipid panel check lipids, not at goal of 70  decrease fatty foods increase activity.   Medication management Monitor  Vitamin D deficiency At goal last visit, no changes, continue same  NASH (nonalcoholic steatohepatitis) Weight loss advised, monitor LFTs  Hiatal hernia with GERD Continue PPI/H2 blocker, diet discussed  Macular hole, unspecified laterality Follows eye doctors  Age-related nuclear cataract, unspecified laterality Follows eye doctors  OSA on CPAP Continue CPAP, using nightly and sees benefit with energy.   Type 2 diabetes mellitus with hyperlipidemia (HCC) -     Hemoglobin A1c Discussed general issues about diabetes pathophysiology and management., Educational material distributed., Suggested low cholesterol diet., Encouraged aerobic exercise., Discussed foot care., Reminded to get yearly retinal exam.  Discussed med's effects and SE's. Screening labs and tests as requested with regular follow-up as recommended. Over 40 minutes of exam, counseling, chart review, and complex, high level critical decision making was performed this visit.   Future Appointments  Date Time Provider [place]  06/16/2020  9:00 AM Liane Comber, NP [facility] None  08/15/2020  2:30 PM Lomax, Amy, NP [facility] None   Health Maintenance:   Health Maintenance  Topic Date Due  . OPHTHALMOLOGY EXAM  05/27/2020  . HEMOGLOBIN A1C  06/07/2020  . FOOT EXAM   03/16/2021  . MAMMOGRAM  10/06/2021  . TETANUS/TDAP  05/09/2025  . COLONOSCOPY  08/16/2026  . DEXA SCAN  Completed  . Hepatitis C Screening  Completed  . INFLUENZA VACCINE  Discontinued  . PNA vac Low Risk Adult  Discontinued   Immunization History  Administered Date(s) Administered  . DT (Pediatric) 05/10/2015  . PFIZER SARS-COV-2 Vaccination 01/30/2020, 02/24/2020  . Td 07/27/2005   Preventative care: cologuard 06/2019 negative Colonoscopy 2017 Dr. Earlean Shawl, normal EGD 2017   Last mammogram: 11/26/2019 at GYN, dense breasts had diagnostic left 11/2019 at breast center negative MRI breasts: 02/2018, normal, resume 3D mammograms Last pap smear/pelvic exam: 05/2018 - by OBGYN - sees annually DEXA:09/2019 managed by OBGYN  CT head 09/2013 MRI head: 12/2017 [place] AB 2012 US Soft tissues 2011 Stress test 05/2016 CXR 06/2017 Sleep study 02/2019  Prior vaccinations: TD or Tdap: 2016 Influenza: declines Pneumococcal: declines Prevnar13: declines Shingles/Zostavax: declines COVID completed 02/2020  Names of Other Physician/Practitioners you currently use: 1. [facility] here for primary care 2. Dr. Kathrin Penner, Dr. Renford Dills, eye doctor, wears glasses 08/2019 abstracted 3. Dr. Randol Kern, Dr. Rayann Heman, dentist, last visit 05/2019 - sees q3 month  Patient Care Team: Unk Pinto, MD as PCP - General (Internal Medicine) Richmond Campbell, MD as Consulting Physician (Gastroenterology) Delila Pereyra, MD as Consulting Physician (Gynecology) Paralee Cancel, MD as Consulting Physician (Orthopedic Surgery)   MEDICARE WELLNESS OBJECTIVES: Physical activity: Current Exercise Habits: Home exercise routine, Type of exercise: walking, Intensity: Mild Cardiac risk factors: Cardiac Risk Factors include: advanced age (>40mn, >>51women);diabetes mellitus;dyslipidemia;hypertension;obesity (BMI >30kg/m2) Depression/mood screen:   [facility]  03/16/2020  Decreased  Interest 0  Down, Depressed, Hopeless 0  PHQ - 2 Score 0    ADLs:  In your present state of health, do you have any difficulty performing the following activities: 03/16/2020 09/26/2019  Hearing? N N  Vision? N N  Difficulty concentrating or making decisions? N N  Walking or climbing stairs? N N  Dressing or bathing? N N  Doing errands, shopping? N N  Some recent data might be hidden     Cognitive Testing  Alert? Yes  Normal Appearance?Yes  Oriented to person? Yes  Place? Yes   Time? Yes  Recall of three objects?  Yes  Can perform simple calculations? Yes  Displays appropriate judgment?Yes  Can read the correct time from a watch face?Yes  EOL planning: Does Patient Have a Medical Advance Directive?: Yes Type of Advance Directive: Healthcare Power of Attorney, Living will Does patient want to make changes to medical advance directive?: No - Patient declined    Medicare Attestation I have personally reviewed: The patient's medical and social history Their use of alcohol, tobacco or illicit drugs Their current medications and supplements The patient's functional ability including ADLs,fall risks, home safety risks, cognitive, and hearing and visual impairment Diet and physical activities Evidence for depression or mood disorders  The patient's weight, height, BMI, and visual acuity have been recorded in the chart.  I have made referrals, counseling, and provided education to the patient based on review of the above and I have provided the patient with a written personalized care plan for preventive services.     HPI  74 y.o. female  presents for a complete physical and follow up for has Morbid obesity (BMI 42.79); Hypertension; Hyperlipidemia; Hiatal hernia with GERD; Vitamin D deficiency; NASH (nonalcoholic steatohepatitis); Medication management; Macular hole; Cataract; Type 2 diabetes mellitus with hyperlipidemia ([place]); and OSA on CPAP on their problem  list.   She is married, 1 daughter, no grandkids but has "granddogs." Primary caregiver for her husband, he has vision issues.   She endorses continued sensation of imbalance, feels as though she may fall over forward with bending over, had MRI head 12/2017 which was negative   She has GERD consequent of hiatal hernia per GI but feels well managed with lifestyle modification and famotidine 40 mg PRN only.  BMI is Body mass index is 40.63 kg/m., she has been working on diet and exercise. She is trying to get below 200 lb. She walks dog daily, 30 min. She eats minimal meat, pushes vegetables. 3-4 bottles of water daily.  She does have a history of NASH. Had + sleep study with Dr. Brett Fairy, she is wearing her CPAP nightly and states it is helping with her fatigue, she has been on it since June. Wt Readings from Last 3 Encounters:  03/16/20 233 lb (105.7 kg)  12/08/19 235 lb 9.6 oz (106.9 kg)  09/22/19 236 lb 6.4 oz (107.2 kg)   Her blood pressure has not been controlled at home (has been 130s/70-80s), today their BP is BP: 126/72 She does workout. She denies chest pain, shortness of breath, dizziness, HA, blurry vision, diarrhea/GI sx.   She is on cholesterol medication (pitivastatin 2 mg daily only due to hx of myalgias with high dose statins) and denies myalgias. Her cholesterol is not at goal. The cholesterol last visit was:   Lab Results  Component Value Date   CHOL 190 12/08/2019   HDL 57 12/08/2019   LDLCALC 108 (H) 12/08/2019   TRIG 136 12/08/2019  CHOLHDL 3.3 12/08/2019   She has been working on diet and exercise for diabetes controlled by diet With hyperlipemia, not at goal, can not tolerate statins, on livalo  she is on bASA  she is on ACE/ARB   Dr. Kathrin Penner, Dr. Renford Dills, eye doctor, wears glasses 05/2019 abstracted and denies foot ulcerations, increased appetite, nausea, paresthesia of the feet, polydipsia, polyuria, visual disturbances, vomiting and weight loss.  Last A1C  in the office was:  Lab Results  Component Value Date   HGBA1C 6.1 (H) 12/08/2019   Last GFR: Lab Results  Component Value Date   GFRAA 91 12/08/2019   Patient is on Vitamin D supplement.   Lab Results  Component Value Date   VD25OH 72 09/08/2019      Current Medications:    Current Outpatient Medications (Cardiovascular):  .  losartan-hydrochlorothiazide (HYZAAR) 100-12.5 MG tablet, Take 1 tablet every Morning for BP .  Pitavastatin Calcium (LIVALO) 4 MG TABS, Take 1 tablet (4 mg total) by mouth at bedtime.   Current Outpatient Medications (Analgesics):  .  aspirin 81 MG tablet, Take 81 mg by mouth daily.   Current Outpatient Medications (Other):  Marland Kitchen  Cholecalciferol (VITAMIN D PO), Take 10,000 Int'l Units by mouth daily.  Marland Kitchen  DICYCLOMINE HCL PO, Take 10 mg by mouth. Take up to 4 tablets prn for abdominal pain and cramping. .  famotidine (PEPCID) 40 MG tablet, Take 1 tablet (40 mg total) by mouth daily as needed for heartburn or indigestion. Marland Kitchen  MAGNESIUM PO, Take 500 mg by mouth.   Allergies:  Allergies  Allergen Reactions  . Anesthetics, Halogenated     Difficulty with waking up from anesthesia from [facility], had aphasia, negative CT  . Crestor [Rosuvastatin]   . Lopid [Gemfibrozil]   . Latex Rash and Swelling    [Rash] redness/swelling/rash   Medical History:  She has Morbid obesity (BMI 42.79); Hypertension; Hyperlipidemia; Hiatal hernia with GERD; Vitamin D deficiency; NASH (nonalcoholic steatohepatitis); Medication management; Macular hole; Cataract; Type 2 diabetes mellitus with hyperlipidemia ([place]); and OSA on CPAP on their problem list.  Surgical History:  She has a past surgical history that includes Abdominal hysterectomy; Eye surgery (Left, 2007); cataract surgery (Left); right arm elbow surgery; index finger surgery (Right); Total hip arthroplasty (Left, 10/06/2013); and right 5th digit finger - cyst removal (Right, 06/04/2019). Family History:  Herfamily  history includes Alzheimer's disease in an other family member; Heart disease in her mother; Kidney failure in her mother; Stroke in her father. Social History:  She reports that she has never smoked. She has never used smokeless tobacco. She reports that she does not drink alcohol or use drugs.  Review of Systems: Review of Systems  Constitutional: Negative for malaise/fatigue and weight loss.  HENT: Negative for hearing loss and tinnitus.   Eyes: Negative for blurred vision and double vision.  Respiratory: Negative for cough, sputum production, shortness of breath and wheezing.   Cardiovascular: Negative for chest pain, palpitations, orthopnea, claudication, leg swelling and PND.  Gastrointestinal: Negative for abdominal pain, blood in stool, constipation, diarrhea, heartburn, melena, nausea and vomiting.  Genitourinary: Negative.   Musculoskeletal: Negative for falls, joint pain and myalgias.  Skin: Negative for rash.  Neurological: Negative for dizziness, tingling, sensory change, weakness and headaches.  Endo/Heme/Allergies: Negative for polydipsia.  Psychiatric/Behavioral: Negative.  Negative for depression, memory loss, substance abuse and suicidal ideas. The patient is not nervous/anxious and does not have insomnia.   All other systems reviewed and are negative.  Physical Exam: Estimated body mass index is 40.63 kg/m as calculated from the following:   Height as of this encounter: 5' 3.5" (1.613 m).   Weight as of this encounter: 233 lb (105.7 kg). BP 126/72   Pulse 74   Temp 97.7 F (36.5 C)   Ht 5' 3.5" (1.613 m)   Wt 233 lb (105.7 kg)   SpO2 97%   BMI 40.63 kg/m  General Appearance: Well nourished, in no apparent distress.  Eyes: PERRLA, EOMs, conjunctiva no swelling or erythema, - defer fundal exam to ophth Sinuses: No Frontal/maxillary tenderness  ENT/Mouth: Ext aud canals clear, normal light reflex with TMs without erythema, bulging. Good dentition. No erythema,  swelling, or exudate on post pharynx. Tonsils not swollen or erythematous. Hearing normal.  Neck: Supple, thyroid normal. No bruits  Respiratory: Respiratory effort normal, BS equal bilaterally without rales, rhonchi, wheezing or stridor.  Cardio: RRR without murmurs, rubs or gallops. Brisk peripheral pulses without edema.  Chest: symmetric, with normal excursions and percussion.  Breasts: Defer to GYN Abdomen: Soft, nontender, no guarding, rebound, hernias, masses, or organomegaly.  Lymphatics: Non tender without lymphadenopathy.  Genitourinary: Defer to GYN Musculoskeletal: Full ROM all peripheral extremities,5/5 strength, and normal gait.  Skin: Warm, dry without rashes, lesions, ecchymosis. Neuro: Cranial nerves intact, reflexes equal bilaterally. Normal muscle tone, no cerebellar symptoms. Sensation intact.  Psych: Awake and oriented X 3, normal affect, Insight and Judgment appropriate.   [name] 12:07 PM [facility]

## 2020-03-16 ENCOUNTER — Ambulatory Visit (INDEPENDENT_AMBULATORY_CARE_PROVIDER_SITE_OTHER): Payer: Medicare PPO | Admitting: Physician Assistant

## 2020-03-16 ENCOUNTER — Other Ambulatory Visit: Payer: Self-pay

## 2020-03-16 ENCOUNTER — Encounter: Payer: Self-pay | Admitting: Physician Assistant

## 2020-03-16 VITALS — BP 126/72 | HR 74 | Temp 97.7°F | Ht 63.5 in | Wt 233.0 lb

## 2020-03-16 DIAGNOSIS — E785 Hyperlipidemia, unspecified: Secondary | ICD-10-CM | POA: Diagnosis not present

## 2020-03-16 DIAGNOSIS — Z79899 Other long term (current) drug therapy: Secondary | ICD-10-CM

## 2020-03-16 DIAGNOSIS — G4733 Obstructive sleep apnea (adult) (pediatric): Secondary | ICD-10-CM | POA: Diagnosis not present

## 2020-03-16 DIAGNOSIS — K7581 Nonalcoholic steatohepatitis (NASH): Secondary | ICD-10-CM

## 2020-03-16 DIAGNOSIS — E559 Vitamin D deficiency, unspecified: Secondary | ICD-10-CM | POA: Diagnosis not present

## 2020-03-16 DIAGNOSIS — Z0000 Encounter for general adult medical examination without abnormal findings: Secondary | ICD-10-CM

## 2020-03-16 DIAGNOSIS — H35349 Macular cyst, hole, or pseudohole, unspecified eye: Secondary | ICD-10-CM | POA: Diagnosis not present

## 2020-03-16 DIAGNOSIS — K219 Gastro-esophageal reflux disease without esophagitis: Secondary | ICD-10-CM | POA: Diagnosis not present

## 2020-03-16 DIAGNOSIS — H2510 Age-related nuclear cataract, unspecified eye: Secondary | ICD-10-CM | POA: Diagnosis not present

## 2020-03-16 DIAGNOSIS — E1169 Type 2 diabetes mellitus with other specified complication: Secondary | ICD-10-CM

## 2020-03-16 DIAGNOSIS — R6889 Other general symptoms and signs: Secondary | ICD-10-CM

## 2020-03-16 DIAGNOSIS — I10 Essential (primary) hypertension: Secondary | ICD-10-CM

## 2020-03-16 DIAGNOSIS — Z0001 Encounter for general adult medical examination with abnormal findings: Secondary | ICD-10-CM

## 2020-03-16 DIAGNOSIS — Z9989 Dependence on other enabling machines and devices: Secondary | ICD-10-CM

## 2020-03-16 DIAGNOSIS — K449 Diaphragmatic hernia without obstruction or gangrene: Secondary | ICD-10-CM

## 2020-03-16 NOTE — Patient Instructions (Addendum)
General eating tips  What to Avoid . Avoid added sugars o Often added sugar can be found in processed foods such as many condiments, dry cereals, cakes, cookies, chips, crisps, crackers, candies, sweetened drinks, etc.  o Read labels and AVOID/DECREASE use of foods with the following in their ingredient list: Sugar, fructose, high fructose corn syrup, sucrose, glucose, maltose, dextrose, molasses, cane sugar, brown sugar, any type of syrup, agave nectar, etc.   . Avoid snacking in between meals- drink water or if you feel you need a snack, pick a high water content snack such as cucumbers, watermelon, or any veggie.  Marland Kitchen Avoid foods made with flour o If you are going to eat food made with flour, choose those made with whole-grains; and, minimize your consumption as much as is tolerable . Avoid processed foods o These foods are generally stocked in the middle of the grocery store.  o Focus on shopping on the perimeter of the grocery.  What to Include . Vegetables o GREEN LEAFY VEGETABLES: Kale, spinach, mustard greens, collard greens, cabbage, broccoli, etc. o OTHER: Asparagus, cauliflower, eggplant, carrots, peas, Brussel sprouts, tomatoes, bell peppers, zucchini, beets, cucumbers, etc. . Grains, seeds, and legumes o Beans: kidney beans, black eyed peas, garbanzo beans, black beans, pinto beans, etc. o Whole, unrefined grains: brown rice, barley, bulgur, oatmeal, etc. . Healthy fats  o Avoid highly processed fats such as vegetable oil o Examples of healthy fats: avocado, olives, virgin olive oil, dark chocolate (?72% Cocoa), nuts (peanuts, almonds, walnuts, cashews, pecans, etc.) o Please still do small amount of these healthy fats, they are dense in calories.  . Low - Moderate Intake of Animal Sources of Protein o Meat sources: chicken, [place], salmon, tuna. Limit to 4 ounces of meat at one time or the size of your palm. o Consider limiting dairy sources, but when choosing dairy focus on:  PLAIN [place] yogurt, cottage cheese, high-protein milk . Fruit o Choose berries   Ask insurance and pharmacy about shingrix - it is a 2 part shot that we will not be getting in the office.   Suggest getting AFTER covid vaccines, have to wait at least a month This shot can make you feel bad due to such good immune response it can trigger some inflammation so take tylenol or aleve day of or day after and plan on resting.   Can go to [email or web address] for more information  Shingrix Vaccination  Two vaccines are licensed and recommended to prevent shingles in the [place] Zoster vaccine live (ZVL, Zostavax) has been in use since 2006. Recombinant zoster vaccine (RZV, Shingrix), has been in use since 2017 and is recommended by AC[employer] as the preferred shingles vaccine.  What Everyone Should Know about Shingles Vaccine (Shingrix) One of the Recommended Vaccines by Disease Shingles vaccination is the only way to protect against shingles and postherpetic neuralgia (PHN), the most common complication from shingles. CDC recommends that healthy adults 50 years and older get two doses of the shingles vaccine called Shingrix (recombinant zoster vaccine), separated by 2 to 6 months, to prevent shingles and the complications from the disease. Your doctor or pharmacist can give you Shingrix as a shot in your upper arm. Shingrix provides strong protection against shingles and PHN. Two doses of Shingrix is more than 90% effective at preventing shingles and PHN. Protection stays above 85% for at least the first four years after you get vaccinated. Shingrix is the preferred vaccine, over Zostavax (zoster vaccine live), a shingles vaccine in  use since 2006. Zostavax may still be used to prevent shingles in healthy adults 60 years and older. For example, you could use Zostavax if a person is allergic to Shingrix, prefers Zostavax, or requests immediate vaccination and  Shingrix is unavailable. Who Should Get Shingrix? Healthy adults 50 years and older should get two doses of Shingrix, separated by 2 to 6 months. You should get Shingrix even if in the past you . had shingles  . received Zostavax  . are not sure if you had chickenpox There is no maximum age for getting Shingrix. If you had shingles in the past, you can get Shingrix to help prevent future occurrences of the disease. There is no specific length of time that you need to wait after having shingles before you can receive Shingrix, but generally you should make sure the shingles rash has gone away before getting vaccinated. You can get Shingrix whether or not you remember having had chickenpox in the past. Studies show that more than 99% of Americans 40 years and older have had chickenpox, even if they don't remember having the disease. Chickenpox and shingles are related because they are caused by the same virus (varicella zoster virus). After a person recovers from chickenpox, the virus stays dormant (inactive) in the body. It can reactivate years later and cause shingles. If you had Zostavax in the recent past, you should wait at least eight weeks before getting Shingrix. Talk to your healthcare provider to determine the best time to get Shingrix. Shingrix is available in [facility] and pharmacies. To find doctor's offices or pharmacies near you that offer the vaccine, visit [facility] Vaccine FinderExternal. If you have questions about Shingrix, talk with your healthcare provider. Vaccine for Those 54 Years and Older  Shingrix reduces the risk of shingles and PHN by more than 90% in people 40 and older. CDC recommends the vaccine for healthy adults 58 and older.  Who Should Not Get Shingrix? You should not get Shingrix if you: . have ever had a severe allergic reaction to any component of the vaccine or after a dose of Shingrix  . tested negative for immunity to varicella zoster virus. If you  test negative, you should get chickenpox vaccine.  . currently have shingles  . currently are pregnant or breastfeeding. Women who are pregnant or breastfeeding should wait to get Shingrix.  Marland Kitchen receive specific antiviral drugs (acyclovir, famciclovir, or valacyclovir) 24 hours before vaccination (avoid use of these antiviral drugs for 14 days after vaccination)- zoster vaccine live only If you have a minor acute (starts suddenly) illness, such as a cold, you may get Shingrix. But if you have a moderate or severe acute illness, you should usually wait until you recover before getting the vaccine. This includes anyone with a temperature of 101.36F or higher. The side effects of the Shingrix are temporary, and usually last 2 to 3 days. While you may experience pain for a few days after getting Shingrix, the pain will be less severe than having shingles and the complications from the disease. How Well Does Shingrix Work? Two doses of Shingrix provides strong protection against shingles and postherpetic neuralgia (PHN), the most common complication of shingles. . In adults 49 to 72 years old who got two doses, Shingrix was 97% effective in preventing shingles; among adults 70 years and older, Shingrix was 91% effective.  . In adults 64 to 72 years old who got two doses, Shingrix was 91% effective in preventing PHN; among adults 6  years and older, Shingrix was 89% effective. Shingrix protection remained high (more than 85%) in people 70 years and older throughout the four years following vaccination. Since your risk of shingles and PHN increases as you get older, it is important to have strong protection against shingles in your older years. Top of Page  What Are the Possible Side Effects of Shingrix? Studies show that Shingrix is safe. The vaccine helps your body create a strong defense against shingles. As a result, you are likely to have temporary side effects from getting the shots. The side effects may  affect your ability to do normal daily activities for 2 to 3 days. Most people got a sore arm with mild or moderate pain after getting Shingrix, and some also had redness and swelling where they got the shot. Some people felt tired, had muscle pain, a headache, shivering, fever, stomach pain, or nausea. About 1 out of 6 people who got Shingrix experienced side effects that prevented them from doing regular activities. Symptoms went away on their own in about 2 to 3 days. Side effects were more common in younger people. You might have a reaction to the first or second dose of Shingrix, or both doses. If you experience side effects, you may choose to take over-the-counter pain medicine such as ibuprofen or acetaminophen. If you experience side effects from Shingrix, you should report them to the Vaccine Adverse Event Reporting System (VAERS). Your doctor might file this report, or you can do it yourself through the VAERS websiteExternal, or by calling [phone]. If you have any questions about side effects from Shingrix, talk with your doctor. The shingles vaccine does not contain thimerosal (a preservative containing mercury). Top of Page  When Should I See a Doctor Because of the Side Effects I Experience From Shingrix? In clinical trials, Shingrix was not associated with serious adverse events. In fact, serious side effects from vaccines are extremely rare. For example, for every 1 million doses of a vaccine given, only one or two people may have a severe allergic reaction. Signs of an allergic reaction happen within minutes or hours after vaccination and include hives, swelling of the face and throat, difficulty breathing, a fast heartbeat, dizziness, or weakness. If you experience these or any other life-threatening symptoms, see a doctor right away. Shingrix causes a strong response in your immune system, so it may produce short-term side effects more intense than you are used to from other  vaccines. These side effects can be uncomfortable, but they are expected and usually go away on their own in 2 or 3 days. Top of Page  How Can I Pay For Shingrix? There are several ways shingles vaccine may be paid for: Medicare . Medicare Part D plans cover the shingles vaccine, but there may be a cost to you depending on your plan. There may be a copay for the vaccine, or you may need to pay in full then get reimbursed for a certain amount.  . Medicare Part B does not cover the shingles vaccine. Medicaid . Medicaid may or may not cover the vaccine. Contact your insurer to find out. Private health insurance . Many private health insurance plans will cover the vaccine, but there may be a cost to you depending on your plan. Contact your insurer to find out. Vaccine assistance programs . Some pharmaceutical companies provide vaccines to eligible adults who cannot afford them. You may want to check with the vaccine manufacturer, [employer], about Shingrix. If you do not  currently have health insurance, learn more about affordable health coverage optionsExternal. To find doctor's offices or pharmacies near you that offer the vaccine, visit HealthMap Vaccine FinderExternal.

## 2020-03-17 LAB — COMPLETE METABOLIC PANEL WITH GFR
AG Ratio: 1.4 (calc) (ref 1.0–2.5)
ALT: 17 U/L (ref 6–29)
AST: 24 U/L (ref 10–35)
Albumin: 4.3 g/dL (ref 3.6–5.1)
Alkaline phosphatase (APISO): 52 U/L (ref 37–153)
BUN: 19 mg/dL (ref 7–25)
CO2: 31 mmol/L (ref 20–32)
Calcium: 10.1 mg/dL (ref 8.6–10.4)
Chloride: 102 mmol/L (ref 98–110)
Creat: 0.63 mg/dL (ref 0.60–0.93)
GFR, Est African American: 105 mL/min/{1.73_m2} (ref >=60)
GFR, Est Non African American: 90 mL/min/{1.73_m2} (ref >=60)
Globulin: 3 g/dL (calc) (ref 1.9–3.7)
Glucose, Bld: 86 mg/dL (ref 65–99)
Potassium: 4.1 mmol/L (ref 3.5–5.3)
Sodium: 140 mmol/L (ref 135–146)
Total Bilirubin: 0.7 mg/dL (ref 0.2–1.2)
Total Protein: 7.3 g/dL (ref 6.1–8.1)

## 2020-03-17 LAB — LIPID PANEL
Cholesterol: 168 mg/dL (ref <200)
HDL: 55 mg/dL (ref >=50)
LDL Cholesterol (Calc): 93 mg/dL (calc)
Non-HDL Cholesterol (Calc): 113 mg/dL (calc) (ref <130)
Total CHOL/HDL Ratio: 3.1 (calc) (ref <5.0)
Triglycerides: 107 mg/dL (ref <150)

## 2020-03-17 LAB — CBC WITH DIFFERENTIAL/PLATELET
Absolute Monocytes: 440 cells/uL (ref 200–950)
Basophils Absolute: 31 cells/uL (ref 0–200)
Basophils Relative: 0.5 %
Eosinophils Absolute: 136 cells/uL (ref 15–500)
Eosinophils Relative: 2.2 %
HCT: 37.5 % (ref 35.0–45.0)
Hemoglobin: 11.9 g/dL (ref 11.7–15.5)
Lymphs Abs: 2182 cells/uL (ref 850–3900)
MCH: 25.9 pg — ABNORMAL LOW (ref 27.0–33.0)
MCHC: 31.7 g/dL — ABNORMAL LOW (ref 32.0–36.0)
MCV: 81.5 fL (ref 80.0–100.0)
MPV: 11 fL (ref 7.5–12.5)
Monocytes Relative: 7.1 %
Neutro Abs: 3410 cells/uL (ref 1500–7800)
Neutrophils Relative %: 55 %
Platelets: 289 10*3/uL (ref 140–400)
RBC: 4.6 10*6/uL (ref 3.80–5.10)
RDW: 13.5 % (ref 11.0–15.0)
Total Lymphocyte: 35.2 %
WBC: 6.2 10*3/uL (ref 3.8–10.8)

## 2020-03-17 LAB — HEMOGLOBIN A1C
Hgb A1c MFr Bld: 5.9 % of total Hgb — ABNORMAL HIGH (ref <5.7)
Mean Plasma Glucose: 123 (calc)
eAG (mmol/L): 6.8 (calc)

## 2020-03-17 LAB — TSH: TSH: 1.21 mIU/L (ref 0.40–4.50)

## 2020-03-22 DIAGNOSIS — G4733 Obstructive sleep apnea (adult) (pediatric): Secondary | ICD-10-CM | POA: Diagnosis not present

## 2020-03-29 DIAGNOSIS — G4733 Obstructive sleep apnea (adult) (pediatric): Secondary | ICD-10-CM | POA: Diagnosis not present

## 2020-04-25 DIAGNOSIS — M722 Plantar fascial fibromatosis: Secondary | ICD-10-CM | POA: Diagnosis not present

## 2020-04-25 DIAGNOSIS — M79672 Pain in left foot: Secondary | ICD-10-CM | POA: Diagnosis not present

## 2020-04-25 DIAGNOSIS — M7662 Achilles tendinitis, left leg: Secondary | ICD-10-CM | POA: Diagnosis not present

## 2020-04-25 DIAGNOSIS — M792 Neuralgia and neuritis, unspecified: Secondary | ICD-10-CM | POA: Diagnosis not present

## 2020-05-09 DIAGNOSIS — M792 Neuralgia and neuritis, unspecified: Secondary | ICD-10-CM | POA: Diagnosis not present

## 2020-05-09 DIAGNOSIS — M722 Plantar fascial fibromatosis: Secondary | ICD-10-CM | POA: Diagnosis not present

## 2020-05-09 DIAGNOSIS — M79672 Pain in left foot: Secondary | ICD-10-CM | POA: Diagnosis not present

## 2020-05-09 DIAGNOSIS — M7662 Achilles tendinitis, left leg: Secondary | ICD-10-CM | POA: Diagnosis not present

## 2020-05-18 ENCOUNTER — Encounter: Payer: Self-pay | Admitting: Physician Assistant

## 2020-05-18 ENCOUNTER — Other Ambulatory Visit: Payer: Self-pay

## 2020-05-18 ENCOUNTER — Ambulatory Visit (INDEPENDENT_AMBULATORY_CARE_PROVIDER_SITE_OTHER): Payer: Medicare PPO | Admitting: Physician Assistant

## 2020-05-18 VITALS — BP 136/82 | HR 72 | Temp 98.6°F | Wt 229.0 lb

## 2020-05-18 DIAGNOSIS — R509 Fever, unspecified: Secondary | ICD-10-CM | POA: Diagnosis not present

## 2020-05-18 DIAGNOSIS — R350 Frequency of micturition: Secondary | ICD-10-CM

## 2020-05-18 DIAGNOSIS — J189 Pneumonia, unspecified organism: Secondary | ICD-10-CM | POA: Diagnosis not present

## 2020-05-18 MED ORDER — LEVOFLOXACIN 500 MG PO TABS: 500.0000 mg | ORAL_TABLET | Freq: Every day | ORAL | 0 refills | Status: DC

## 2020-05-18 NOTE — Progress Notes (Signed)
Subjective:    Patient ID: Anita Blake, female    DOB: January 03, 1946, 74 y.o.   MRN: [id number]  HPI 74 y.o. obese AAF with history of OSA on CPAP,HTN, DM2 with chol, NASH, presents with cough x May 28th. She has never smoked.  Started with chills, headache, took tylenol then 2 hours later would come back. Then she continued to feel bad through the week, until this morning she felt she could not get up from her bed, took her temperature and it was 100, with sweating. Fever of 101 this AM, per daughter.   Daughter states he has been having cough, wheezing, sounded SOB when she went over. No chest pain, no palpitations.  Some stomach pain diffuse, some nausea, decreased appetite. She did have 2 days of vomiting/diarrhea after eating bad cottage cheese April 7/8th.  She was getting up to urinate every 2 hours and she urinated a lot on Saturday but other wise no symptoms.   Would feel a knot in her stomach and then she would have a BM and the feeling would go away. No diarrhea, no constipation, no blood, no black stool.  CT AB 2006 showed diverticulosis  She has had COVID vaccine, Pfizer, last one was march 10th.  CXR 2018 showed aortic atherosclerosis changes and prominent right hilar structures unchanged.  Stress test 2017 normal  Blood pressure 136/82, pulse 72, temperature 98.6 F (37 C), weight 229 lb (103.9 kg), SpO2 97 %.  Medications   Current Outpatient Medications (Cardiovascular):    losartan-hydrochlorothiazide (HYZAAR) 100-12.5 MG tablet, Take 1 tablet every Morning for BP   Pitavastatin Calcium (LIVALO) 4 MG TABS, Take 1 tablet (4 mg total) by mouth at bedtime.   Current Outpatient Medications (Analgesics):    aspirin 81 MG tablet, Take 81 mg by mouth daily.   Current Outpatient Medications (Other):    Cholecalciferol (VITAMIN D PO), Take 10,000 Int'l Units by mouth daily.    famotidine (PEPCID) 40 MG tablet, Take 1 tablet (40 mg total) by mouth daily as needed  for heartburn or indigestion.   MAGNESIUM PO, Take 500 mg by mouth.   Problem list She has Morbid obesity (BMI 42.79); Hypertension; Hyperlipidemia; Hiatal hernia with GERD; Vitamin D deficiency; NASH (nonalcoholic steatohepatitis); Medication management; Macular hole; Cataract; Type 2 diabetes mellitus with hyperlipidemia ([place]); and OSA on CPAP on their problem list.   Review of Systems  Constitutional: Positive for appetite change, chills, fatigue and fever. Negative for activity change, diaphoresis and unexpected weight change.  HENT: Positive for sore throat. Negative for congestion, dental problem, ear discharge, ear pain, facial swelling, nosebleeds, rhinorrhea, sinus pressure, trouble swallowing and voice change.   Respiratory: Positive for cough, shortness of breath and wheezing. Negative for apnea, choking, chest tightness and stridor.   Cardiovascular: Negative.  Negative for chest pain, palpitations and leg swelling.  Gastrointestinal: Positive for abdominal pain and nausea. Negative for abdominal distention, anal bleeding, blood in stool, constipation, diarrhea, rectal pain and vomiting.  Genitourinary: Positive for frequency. Negative for difficulty urinating, dyspareunia, dysuria, enuresis and flank pain.  Musculoskeletal: Negative.   Skin: Negative.  Negative for rash.  Neurological: Positive for headaches. Negative for dizziness, tremors, seizures, syncope, facial asymmetry, speech difficulty, weakness, light-headedness and numbness.  Hematological: Negative.   Psychiatric/Behavioral: Negative.        Objective:   Physical Exam Constitutional:      General: She is not in acute distress.    Appearance: She is obese. She is ill-appearing. She  is not toxic-appearing or diaphoretic.  HENT:     Right Ear: There is impacted cerumen.     Left Ear: Tympanic membrane and ear canal normal.     Mouth/Throat:     Mouth: Mucous membranes are moist. No oral lesions.     Tongue: No  lesions. Tongue does not deviate from midline.     Pharynx: Uvula midline. Posterior oropharyngeal erythema present. No pharyngeal swelling, oropharyngeal exudate or uvula swelling.  Eyes:     Extraocular Movements: Extraocular movements intact.     Pupils: Pupils are equal, round, and reactive to light.  Cardiovascular:     Rate and Rhythm: Normal rate and regular rhythm.     Pulses: Normal pulses.     Heart sounds: Murmur present. Gallop: 2/6 systolic murmur.   Pulmonary:     Effort: Pulmonary effort is normal.     Breath sounds: Wheezing (RML) present.  Abdominal:     General: Bowel sounds are normal.     Palpations: Abdomen is soft. There is no mass.     Tenderness: There is no abdominal tenderness. There is no guarding or rebound.  Musculoskeletal:        General: Normal range of motion.     Cervical back: Normal range of motion and neck supple.  Lymphadenopathy:     Cervical: No cervical adenopathy.  Skin:    General: Skin is warm and dry.  Neurological:     General: No focal deficit present.     Mental Status: She is alert and oriented to person, place, and time. Mental status is at baseline.     Cranial Nerves: No cranial nerve deficit.     Motor: No weakness.     Coordination: Coordination normal.     Gait: Gait normal.  Psychiatric:        Mood and Affect: Mood normal.        Behavior: Behavior normal.        Assessment & Plan:  Berlene was seen today for cough, chills, fatigue, headache and acute visit.  Diagnoses and all orders for this visit:  Fever, unspecified fever cause, chills with multitude of symptoms-  vitals stable right now- will treat with broad based ABX, levaquin, discussed tendenopathy risk with it but has tolerated in the past, check CXR- patient with abnormal RML breath sounds and history of vomiting and possible aspiration 1 month ago, also with urinary frequency. Non tender AB.  -     DG Chest 2 View; Future -     CBC with  Differential/Platelet -     COMPLETE METABOLIC PANEL WITH GFR -     Urinalysis, Routine w reflex microscopic -     Urine Culture -     levofloxacin (LEVAQUIN) 500 MG tablet; Take 1 tablet (500 mg total) by mouth daily. No known COVID exposure and she has had vaccine but suggest getting test to make sure   Pneumonia of right middle lobe due to infectious organism -     DG Chest 2 View; Future -     levofloxacin (LEVAQUIN) 500 MG tablet; Take 1 tablet (500 mg total) by mouth daily.  Urinary frequency -     Urinalysis, Routine w reflex microscopic -     Urine Culture   The patient was advised to call immediately if she has any concerning symptoms in the interval. The patient voices understanding of current treatment options and is in agreement with the current care plan.The patient knows to  call the clinic with any problems, questions or concerns or go to the ER if any further progression of symptoms.

## 2020-05-18 NOTE — Patient Instructions (Addendum)
Will give levaquin- please call if you have any tendon/joint pain- may need to switch to 2 antibiotics.   PLEASE GET COVID TESTING   INFORMATION ABOUT YOUR XRAY  Can walk into [address] for an Insurance account manager. They will have the order and take you back. You do not any paper work, I should get the result back today or tomorrow. This order is good for a year.  Can call [phone] to schedule an appointment if you wish.   The patient was advised to call immediately if she has any concerning symptoms in the interval. The patient voices understanding of current treatment options and is in agreement with the current care plan.The patient knows to call the clinic with any problems, questions or concerns or go to the ER if any further progression of symptoms.   Go to the ER if any chest pain, shortness of breath, nausea, dizziness, severe HA, changes vision/speech

## 2020-05-19 ENCOUNTER — Other Ambulatory Visit: Payer: Self-pay | Admitting: Physician Assistant

## 2020-05-19 ENCOUNTER — Ambulatory Visit
Admission: RE | Admit: 2020-05-19 | Discharge: 2020-05-19 | Disposition: A | Discharge status: Home / Self Care | Payer: Medicare PPO | Source: Ambulatory Visit | Attending: Physician Assistant | Admitting: Physician Assistant

## 2020-05-19 DIAGNOSIS — R918 Other nonspecific abnormal finding of lung field: Secondary | ICD-10-CM | POA: Diagnosis not present

## 2020-05-19 DIAGNOSIS — J189 Pneumonia, unspecified organism: Secondary | ICD-10-CM

## 2020-05-19 DIAGNOSIS — R509 Fever, unspecified: Secondary | ICD-10-CM

## 2020-05-19 LAB — COMPLETE METABOLIC PANEL WITH GFR
AG Ratio: 1.3 (calc) (ref 1.0–2.5)
ALT: 46 U/L — ABNORMAL HIGH (ref 6–29)
AST: 35 U/L (ref 10–35)
Albumin: 4.1 g/dL (ref 3.6–5.1)
Alkaline phosphatase (APISO): 70 U/L (ref 37–153)
BUN: 14 mg/dL (ref 7–25)
CO2: 31 mmol/L (ref 20–32)
Calcium: 9.2 mg/dL (ref 8.6–10.4)
Chloride: 101 mmol/L (ref 98–110)
Creat: 0.73 mg/dL (ref 0.60–0.93)
GFR, Est African American: 96 mL/min/{1.73_m2} (ref >=60)
GFR, Est Non African American: 83 mL/min/{1.73_m2} (ref >=60)
Globulin: 3.1 g/dL (calc) (ref 1.9–3.7)
Glucose, Bld: 96 mg/dL (ref 65–99)
Potassium: 4.6 mmol/L (ref 3.5–5.3)
Sodium: 140 mmol/L (ref 135–146)
Total Bilirubin: 0.7 mg/dL (ref 0.2–1.2)
Total Protein: 7.2 g/dL (ref 6.1–8.1)

## 2020-05-19 LAB — URINALYSIS, ROUTINE W REFLEX MICROSCOPIC
Bilirubin Urine: NEGATIVE
Glucose, UA: NEGATIVE
Hgb urine dipstick: NEGATIVE
Hyaline Cast: NONE SEEN /LPF
Ketones, ur: NEGATIVE
Leukocytes,Ua: NEGATIVE
Nitrite: NEGATIVE
Specific Gravity, Urine: 1.019 (ref 1.001–1.03)
pH: 8 (ref 5.0–8.0)

## 2020-05-19 LAB — URINE CULTURE
MICRO NUMBER:: 10544823
Result:: NO GROWTH
SPECIMEN QUALITY:: ADEQUATE

## 2020-05-19 LAB — CBC WITH DIFFERENTIAL/PLATELET
Absolute Monocytes: 929 cells/uL (ref 200–950)
Basophils Absolute: 37 cells/uL (ref 0–200)
Basophils Relative: 0.4 %
Eosinophils Absolute: 92 cells/uL (ref 15–500)
Eosinophils Relative: 1 %
HCT: 38 % (ref 35.0–45.0)
Hemoglobin: 12 g/dL (ref 11.7–15.5)
Lymphs Abs: 1426 cells/uL (ref 850–3900)
MCH: 25.6 pg — ABNORMAL LOW (ref 27.0–33.0)
MCHC: 31.6 g/dL — ABNORMAL LOW (ref 32.0–36.0)
MCV: 81.2 fL (ref 80.0–100.0)
MPV: 11.1 fL (ref 7.5–12.5)
Monocytes Relative: 10.1 %
Neutro Abs: 6716 cells/uL (ref 1500–7800)
Neutrophils Relative %: 73 %
Platelets: 275 10*3/uL (ref 140–400)
RBC: 4.68 10*6/uL (ref 3.80–5.10)
RDW: 13.9 % (ref 11.0–15.0)
Total Lymphocyte: 15.5 %
WBC: 9.2 10*3/uL (ref 3.8–10.8)

## 2020-05-19 MED ORDER — AMOXICILLIN-POT CLAVULANATE 875-125 MG PO TABS: 1.0000 | ORAL_TABLET | Freq: Two times a day (BID) | ORAL | 0 refills | Status: DC

## 2020-05-19 MED ORDER — DOXYCYCLINE HYCLATE 100 MG PO CAPS: ORAL_CAPSULE | ORAL | 0 refills | Status: DC

## 2020-05-19 NOTE — Progress Notes (Signed)
Lab Results  Component Value Date   GFRAA 96 05/18/2020

## 2020-05-20 ENCOUNTER — Ambulatory Visit: Payer: Medicare PPO

## 2020-06-09 ENCOUNTER — Encounter: Payer: Medicare Other | Admitting: Adult Health

## 2020-06-09 DIAGNOSIS — M7662 Achilles tendinitis, left leg: Secondary | ICD-10-CM | POA: Diagnosis not present

## 2020-06-09 DIAGNOSIS — I739 Peripheral vascular disease, unspecified: Secondary | ICD-10-CM | POA: Diagnosis not present

## 2020-06-09 DIAGNOSIS — M79672 Pain in left foot: Secondary | ICD-10-CM | POA: Diagnosis not present

## 2020-06-09 DIAGNOSIS — M792 Neuralgia and neuritis, unspecified: Secondary | ICD-10-CM | POA: Diagnosis not present

## 2020-06-09 DIAGNOSIS — M722 Plantar fascial fibromatosis: Secondary | ICD-10-CM | POA: Diagnosis not present

## 2020-06-13 DIAGNOSIS — J189 Pneumonia, unspecified organism: Secondary | ICD-10-CM

## 2020-06-13 NOTE — Telephone Encounter (Signed)
-----   Message from Elenor Quinones, [place] sent at 06/13/2020  3:44 PM EDT ----- Regarding: phone Contact: [phone] Says she was suppose to have a @nd  CXR before she comes into see [place] on 06/16/2020 She would like to know if that order has been placed/sent over?   Patient has MyChart & she does look at it & a message has been sent to the patient to inform her that the message has been sent to the provider & that the provider can/will send the reply directly to her.    Please & thank you!

## 2020-06-14 ENCOUNTER — Ambulatory Visit
Admission: RE | Admit: 2020-06-14 | Discharge: 2020-06-14 | Disposition: A | Discharge status: Home / Self Care | Payer: Medicare PPO | Source: Ambulatory Visit | Attending: Physician Assistant | Admitting: Physician Assistant

## 2020-06-14 ENCOUNTER — Other Ambulatory Visit: Payer: Self-pay

## 2020-06-14 DIAGNOSIS — J984 Other disorders of lung: Secondary | ICD-10-CM | POA: Diagnosis not present

## 2020-06-14 DIAGNOSIS — R918 Other nonspecific abnormal finding of lung field: Secondary | ICD-10-CM | POA: Diagnosis not present

## 2020-06-14 DIAGNOSIS — J189 Pneumonia, unspecified organism: Secondary | ICD-10-CM

## 2020-06-14 DIAGNOSIS — I700 Atherosclerosis of aorta: Secondary | ICD-10-CM | POA: Diagnosis not present

## 2020-06-15 ENCOUNTER — Other Ambulatory Visit: Payer: Self-pay | Admitting: Physician Assistant

## 2020-06-15 DIAGNOSIS — R7309 Other abnormal glucose: Secondary | ICD-10-CM | POA: Insufficient documentation

## 2020-06-15 DIAGNOSIS — R9389 Abnormal findings on diagnostic imaging of other specified body structures: Secondary | ICD-10-CM | POA: Insufficient documentation

## 2020-06-15 DIAGNOSIS — I700 Atherosclerosis of aorta: Secondary | ICD-10-CM

## 2020-06-15 DIAGNOSIS — R911 Solitary pulmonary nodule: Secondary | ICD-10-CM

## 2020-06-15 NOTE — Progress Notes (Addendum)
Complete Physical  Assessment and Plan:  Encounter for routine physical exam with abnormal findings Follow up GYN, requested diabetes eye exam   Labile hypertension Elevated today but has been stable/at goal at home;  Monitor blood pressure at home; call if consistently over 130/80 Will follow up sooner in 3 months Continue DASH diet.   Reminder to go to the ER if any CP, SOB, nausea, dizziness, severe HA, changes vision/speech, left arm numbness and tingling and jaw pain.  Gastroesophageal reflux disease, esophagitis presence not specified Well managed on current medications, continue PPI due to hital hernia, follow up GI prn  Discussed diet, avoiding triggers and other lifestyle changes  NASH (nonalcoholic steatohepatitis) Weight loss advised, avoid alcohol/tylenol CMP/GFR  Prediabetes Discussed disease and risks Discussed diet/exercise, weight management  Dietary recommendations Physical Activity recommendations -     Hemoglobin A1c  Age-related nuclear cataract, unspecified laterality Followed by ophthalmology   Hyperlipidemia, unspecified hyperlipidemia type Continue statin medication Continue low cholesterol diet and exercise.  Check lipid panel.  -     Lipid panel -     TSH  Macular hole, unspecified laterality Followed by ophthalmology  Morbid obesity ([place]) Long discussion about weight loss, diet, and exercise Recommended diet heavy in fruits and veggies and low in animal meats, cheeses, and dairy products, appropriate calorie intake Discussed appropriate weight for height, continue with close monitoring for weight loss q30m  Follow up at next visit  Vitamin D deficiency Continue to recommend supplementation for goal of 70-100 Check vitamin D level  OSA on CPAP  Reports 100% compliance w  Abnormal CXR  Pending CT, denies notable sx   Orders Placed This Encounter  Procedures  . CBC with Differential/Platelet  . COMPLETE METABOLIC PANEL WITH GFR  .  Magnesium  . Lipid panel  . TSH  . Hemoglobin A1c  . VITAMIN D 25 Hydroxy (Vit-D Deficiency, Fractures)  . Microalbumin / creatinine urine ratio  . Urinalysis, Routine w reflex microscopic  . EKG 12-Lead   Discussed med's effects and SE's. Screening labs and tests as requested with regular follow-up as recommended. Over 40 minutes of exam, counseling, chart review, and complex, high level critical decision making was performed this visit.   Future Appointments  Date Time Provider DLamoni 07/05/2020  9:20 AM GI-315 CT 1 GI-315CT [facility] [facility]  08/15/2020  2:30 PM Lomax, Amy, NP [facility] None  06/16/2021  9:30 AM CLiane Comber NP [facility] None     HPI  74y.o. female  presents for a complete physical and follow up for has Morbid obesity (BMI 42.79); Hypertension; Hyperlipemia; Hiatal hernia with GERD; Vitamin D deficiency; NASH (nonalcoholic steatohepatitis); Medication management; Macular hole; Cataract; OSA on CPAP; Prediabetes; Aortic atherosclerosis ([place]; and Abnormal chest x-ray on their problem list.   She is married, 1 daughter, no grandkids but has "granddogs." She is primary caregiver for husband who is blind.  Had CXR for URI/cough sx 05/19/2020 - showed 1.2 cm rounded opacity of R medial lung apex, completed abx with stable opacity on follow up xray 06/14/2020, was recommended Ct chest for further evaluation which has been ordered and pending. Never smoker, sclerosis R sternovlavicular joint vs lung nodule. Denies cough, night sweats, fatigue, unintentional weight loss.   Has some plantar fascitis following with podiatry, had injection with some improvement, but still limiting walking some.   She has GERD consequent of hiatal hernia per GI, but feels well managed with lifestyle modification and famotidine 40 mg PRN only.  OSA on CPAP, endorses 100% compliance and restorative sleep.   BMI is Body mass index is 39.75 kg/m., she has been working on diet and exercise.   She is trying to get below 200 lb.  She walks dog daily, 30 min.  She eats minimal meat, pushes vegetables.  3-4 bottles of water daily.  Wt Readings from Last 3 Encounters:  06/16/20 228 lb (103.4 kg)  05/18/20 229 lb (103.9 kg)  03/16/20 233 lb (105.7 kg)   Aortic atherosclerosis per CT 05/2020 Her blood pressure has not been controlled at home (has been 130s/70-80s), today their BP is BP: (!) 142/80 She does workout. She denies chest pain, shortness of breath, dizziness, HA, blurry vision, diarrhea/GI sx.   She is on cholesterol medication (pitivastatin 2 mg daily only due to hx of myalgias with high dose statins) and denies myalgias. Her cholesterol is not at goal. The cholesterol last visit was:   Lab Results  Component Value Date   CHOL 168 03/16/2020   HDL 55 03/16/2020   LDLCALC 93 03/16/2020   TRIG 107 03/16/2020   CHOLHDL 3.1 03/16/2020   She has been working on diet and exercise for prediabetes, she is on bASA, she is on ACE/ARB and denies foot ulcerations, increased appetite, nausea, paresthesia of the feet, polydipsia, polyuria, visual disturbances, vomiting and weight loss. Last A1C in the office was:  Lab Results  Component Value Date   HGBA1C 5.9 (H) 03/16/2020   Last GFR: Lab Results  Component Value Date   GFRAA 96 05/18/2020   Patient is on Vitamin D supplement.   Lab Results  Component Value Date   VD25OH 72 09/08/2019     She has fatty liver per [place] 2012 with mild stable LFT elevations monitored  Lab Results  Component Value Date   ALT 46 (H) 05/18/2020   AST 35 05/18/2020   ALKPHOS 62 05/16/2017   BILITOT 0.7 05/18/2020    Current Medications:  Current Outpatient Medications on File Prior to Visit  Medication Sig Dispense Refill  . aspirin 81 MG tablet Take 81 mg by mouth daily.    . Cholecalciferol (VITAMIN D PO) Take 10,000 Int'l Units by mouth daily.     . famotidine (PEPCID) 40 MG tablet Take 1 tablet (40 mg total) by mouth daily as needed  for heartburn or indigestion. 180 tablet 1  . losartan-hydrochlorothiazide (HYZAAR) 100-12.5 MG tablet Take 1 tablet every Morning for BP 90 tablet 0  . MAGNESIUM PO Take 500 mg by mouth.     . Pitavastatin Calcium (LIVALO) 4 MG TABS Take 1 tablet (4 mg total) by mouth at bedtime. 90 tablet 1  . Probiotic Product (PROBIOTIC DAILY PO) Take by mouth daily.    . psyllium (METAMUCIL) 58.6 % powder Take 1 packet by mouth daily.    Marland Kitchen amoxicillin-clavulanate (AUGMENTIN) 875-125 MG tablet Take 1 tablet by mouth 2 (two) times daily. 7 days 14 tablet 0  . doxycycline (VIBRAMYCIN) 100 MG capsule Take 1 capsule twice daily with food 20 capsule 0  . levofloxacin (LEVAQUIN) 500 MG tablet Take 1 tablet (500 mg total) by mouth daily. 10 tablet 0   No current facility-administered medications on file prior to visit.   Allergies:  Allergies  Allergen Reactions  . Anesthetics, Halogenated     Difficulty with waking up from anesthesia from [facility], had aphasia, negative CT  . Crestor [Rosuvastatin]   . Lopid [Gemfibrozil]   . Latex Rash and Swelling    [Rash]  redness/swelling/rash   Medical History:  She has Morbid obesity (BMI 42.79); Hypertension; Hyperlipemia; Hiatal hernia with GERD; Vitamin D deficiency; NASH (nonalcoholic steatohepatitis); Medication management; Macular hole; Cataract; OSA on CPAP; Prediabetes; Aortic atherosclerosis ([place]); and Abnormal chest x-ray on their problem list. Health Maintenance:   Immunization History  Administered Date(s) Administered  . DT (Pediatric) 05/10/2015  . PFIZER SARS-COV-2 Vaccination 01/30/2020, 02/24/2020  . Td 07/27/2005   Preventative care: cologuard 06/2019 negative Colonoscopy 2017 Dr. Earlean Shawl, normal EGD 2017   Last mammogram: 11/26/2019 at GYN, dense breasts had diagnostic left 11/2019 at breast center negative MRI breasts: 02/2018, normal, resume 3D mammograms Last pap smear/pelvic exam: 2020 - by OBGYN - sees annually DEXA: 09/2019 managed by  OBGYN  CT head 09/2013 MRI head: 12/2017 [place] AB 2012 US Soft tissues 2011 Stress test 05/2016 CXR 05/2020 Sleep study 02/2019  Prior vaccinations: TD or Tdap: 2016 Influenza: declines Pneumococcal: declines Prevnar13: declines Shingles/Zostavax: declines COVID completed 02/2020  Names of Other Physician/Practitioners you currently use: 1. [facility] here for primary care 2. Dr. Kathrin Penner, Dr. Renford Dills, eye doctor, wears glasses 08/2019 abstracted, has seen in 2021, requested note for diabetes eye exam  3. Dr. Randol Kern, Dr. Rayann Heman, dentist, last visit 2021 - sees q3 month   Patient Care Team: Unk Pinto, MD as PCP - General (Internal Medicine) Richmond Campbell, MD as Consulting Physician (Gastroenterology) Delila Pereyra, MD as Consulting Physician (Gynecology) Paralee Cancel, MD as Consulting Physician (Orthopedic Surgery)  Surgical History:  She has a past surgical history that includes Abdominal hysterectomy; Eye surgery (Left, 2007); cataract surgery (Left); right arm elbow surgery; index finger surgery (Right); Total hip arthroplasty (Left, 10/06/2013); and right 5th digit finger - cyst removal (Right, 06/04/2019). Family History:  Herfamily history includes Alzheimer's disease in an other family member; Heart disease in her mother; Kidney failure in her mother; Stroke in her father. Social History:  She reports that she has never smoked. She has never used smokeless tobacco. She reports that she does not drink alcohol and does not use drugs.  Review of Systems: Review of Systems  Constitutional: Negative for malaise/fatigue and weight loss.  HENT: Negative for hearing loss and tinnitus.   Eyes: Negative for blurred vision and double vision.  Respiratory: Negative for cough, sputum production, shortness of breath and wheezing.   Cardiovascular: Negative for chest pain, palpitations, orthopnea, claudication, leg swelling and PND.   Gastrointestinal: Negative for abdominal pain, blood in stool, constipation, diarrhea, heartburn, melena, nausea and vomiting.  Genitourinary: Negative.   Musculoskeletal: Negative for falls, joint pain and myalgias.  Skin: Negative for rash.  Neurological: Negative for dizziness, tingling, sensory change, weakness and headaches.  Endo/Heme/Allergies: Negative for polydipsia.  Psychiatric/Behavioral: Negative.  Negative for depression, memory loss, substance abuse and suicidal ideas. The patient is not nervous/anxious and does not have insomnia.   All other systems reviewed and are negative.   Physical Exam: Estimated body mass index is 39.75 kg/m as calculated from the following:   Height as of this encounter: 5' 3.5" (1.613 m).   Weight as of this encounter: 228 lb (103.4 kg). BP (!) 142/80   Pulse 65   Temp (!) 97.5 F (36.4 C)   Ht 5' 3.5" (1.613 m)   Wt 228 lb (103.4 kg)   SpO2 96%   BMI 39.75 kg/m  General Appearance: Well nourished, in no apparent distress.  Eyes: PERRLA, EOMs, conjunctiva no swelling or erythema,  Sinuses: No Frontal/maxillary tenderness  ENT/Mouth: Ext  aud canals clear, normal light reflex with TMs without erythema, bulging. Good dentition. No erythema, swelling, or exudate on post pharynx. Tonsils not swollen or erythematous. Hearing normal.  Neck: Supple, thyroid normal. No bruits  Respiratory: Respiratory effort normal, BS equal bilaterally without rales, rhonchi, wheezing or stridor.  Cardio: RRR without murmurs, rubs or gallops. Brisk peripheral pulses without edema.  Chest: symmetric, with normal excursions and percussion.  Breasts: Defer to GYN Abdomen: Soft, nontender, no guarding, rebound, hernias, masses, or organomegaly.  Lymphatics: Non tender without lymphadenopathy.  Genitourinary: Defer to GYN Musculoskeletal: Full ROM all peripheral extremities,5/5 strength, and normal gait.  Skin: Warm, dry without rashes, lesions, ecchymosis. Neuro:  Cranial nerves intact, reflexes equal bilaterally. Normal muscle tone, no cerebellar symptoms. Sensation intact.  Psych: Awake and oriented X 3, normal affect, Insight and Judgment appropriate.   EKG: Sinus arrhythmia with mild brady  Izora Ribas 9:31 AM [facility]

## 2020-06-16 ENCOUNTER — Encounter: Payer: Self-pay | Admitting: Adult Health

## 2020-06-16 ENCOUNTER — Other Ambulatory Visit: Payer: Self-pay

## 2020-06-16 ENCOUNTER — Ambulatory Visit (INDEPENDENT_AMBULATORY_CARE_PROVIDER_SITE_OTHER): Payer: Medicare PPO | Admitting: Adult Health

## 2020-06-16 VITALS — BP 142/80 | HR 65 | Temp 97.5°F | Ht 63.5 in | Wt 228.0 lb

## 2020-06-16 DIAGNOSIS — R7309 Other abnormal glucose: Secondary | ICD-10-CM

## 2020-06-16 DIAGNOSIS — Z131 Encounter for screening for diabetes mellitus: Secondary | ICD-10-CM

## 2020-06-16 DIAGNOSIS — Z136 Encounter for screening for cardiovascular disorders: Secondary | ICD-10-CM

## 2020-06-16 DIAGNOSIS — I10 Essential (primary) hypertension: Secondary | ICD-10-CM | POA: Diagnosis not present

## 2020-06-16 DIAGNOSIS — Z79899 Other long term (current) drug therapy: Secondary | ICD-10-CM | POA: Diagnosis not present

## 2020-06-16 DIAGNOSIS — K449 Diaphragmatic hernia without obstruction or gangrene: Secondary | ICD-10-CM | POA: Diagnosis not present

## 2020-06-16 DIAGNOSIS — K219 Gastro-esophageal reflux disease without esophagitis: Secondary | ICD-10-CM | POA: Diagnosis not present

## 2020-06-16 DIAGNOSIS — Z9989 Dependence on other enabling machines and devices: Secondary | ICD-10-CM

## 2020-06-16 DIAGNOSIS — Z0000 Encounter for general adult medical examination without abnormal findings: Secondary | ICD-10-CM | POA: Diagnosis not present

## 2020-06-16 DIAGNOSIS — E785 Hyperlipidemia, unspecified: Secondary | ICD-10-CM | POA: Diagnosis not present

## 2020-06-16 DIAGNOSIS — H2510 Age-related nuclear cataract, unspecified eye: Secondary | ICD-10-CM

## 2020-06-16 DIAGNOSIS — R7303 Prediabetes: Secondary | ICD-10-CM | POA: Diagnosis not present

## 2020-06-16 DIAGNOSIS — K7581 Nonalcoholic steatohepatitis (NASH): Secondary | ICD-10-CM

## 2020-06-16 DIAGNOSIS — Z1329 Encounter for screening for other suspected endocrine disorder: Secondary | ICD-10-CM

## 2020-06-16 DIAGNOSIS — Z0001 Encounter for general adult medical examination with abnormal findings: Secondary | ICD-10-CM

## 2020-06-16 DIAGNOSIS — H35349 Macular cyst, hole, or pseudohole, unspecified eye: Secondary | ICD-10-CM

## 2020-06-16 DIAGNOSIS — E559 Vitamin D deficiency, unspecified: Secondary | ICD-10-CM | POA: Diagnosis not present

## 2020-06-16 DIAGNOSIS — G4733 Obstructive sleep apnea (adult) (pediatric): Secondary | ICD-10-CM

## 2020-06-16 DIAGNOSIS — I700 Atherosclerosis of aorta: Secondary | ICD-10-CM

## 2020-06-16 NOTE — Patient Instructions (Addendum)
   Anita Blake , Thank you for taking time to come for your Medicare Wellness Visit. I appreciate your ongoing commitment to your health goals. Please review the following plan we discussed and let me know if I can assist you in the future.   These are the goals we discussed: Goals    . Blood Pressure < 130/80    . HEMOGLOBIN A1C < 5.7    . LDL CALC < 100    . Weight (lb) < 220 lb (99.8 kg)       This is a list of the screening recommended for you and due dates:  Health Maintenance  Topic Date Due  . Mammogram  10/06/2021  . Tetanus Vaccine  05/09/2025  . Colon Cancer Screening  08/16/2026  . DEXA scan (bone density measurement)  Completed  . COVID-19 Vaccine  Completed  .  Hepatitis C: One time screening is recommended by Center for Disease Control  (CDC) for  adults born from [birth year] through 1963.   Completed  . Flu Shot  Discontinued  . Pneumonia vaccines  Discontinued      Please request diabetes eye exam report be forwarded to our office for 2021     Know what a healthy weight is for you (roughly BMI <28 for age 61+) and aim to maintain this  Aim for 7+ servings of fruits and vegetables daily  65-80+ fluid ounces of water or unsweet tea for healthy kidneys  Limit to max 1 drink of alcohol per day; avoid smoking/tobacco  Limit animal fats in diet for cholesterol and heart health - choose grass fed whenever available  Avoid highly processed foods, and foods high in saturated/trans fats  Aim for low stress - take time to unwind and care for your mental health  Aim for 150 min of moderate intensity exercise weekly for heart health, and weights twice weekly for bone health  Aim for 7-9 hours of sleep daily     A great goal to work towards is aiming to get in a serving daily of some of the most nutritionally dense foods - G- BOMBS daily

## 2020-06-17 LAB — MICROALBUMIN / CREATININE URINE RATIO
Creatinine, Urine: 128 mg/dL (ref 20–275)
Microalb Creat Ratio: 5 mcg/mg creat (ref <30)
Microalb, Ur: 0.6 mg/dL

## 2020-06-17 LAB — LIPID PANEL
Cholesterol: 165 mg/dL (ref <200)
HDL: 58 mg/dL (ref >=50)
LDL Cholesterol (Calc): 88 mg/dL (calc)
Non-HDL Cholesterol (Calc): 107 mg/dL (calc) (ref <130)
Total CHOL/HDL Ratio: 2.8 (calc) (ref <5.0)
Triglycerides: 101 mg/dL (ref <150)

## 2020-06-17 LAB — CBC WITH DIFFERENTIAL/PLATELET
Absolute Monocytes: 499 cells/uL (ref 200–950)
Basophils Absolute: 39 cells/uL (ref 0–200)
Basophils Relative: 0.5 %
Eosinophils Absolute: 273 cells/uL (ref 15–500)
Eosinophils Relative: 3.5 %
HCT: 35.2 % (ref 35.0–45.0)
Hemoglobin: 11.1 g/dL — ABNORMAL LOW (ref 11.7–15.5)
Lymphs Abs: 3237 cells/uL (ref 850–3900)
MCH: 25.3 pg — ABNORMAL LOW (ref 27.0–33.0)
MCHC: 31.5 g/dL — ABNORMAL LOW (ref 32.0–36.0)
MCV: 80.2 fL (ref 80.0–100.0)
MPV: 10.9 fL (ref 7.5–12.5)
Monocytes Relative: 6.4 %
Neutro Abs: 3752 cells/uL (ref 1500–7800)
Neutrophils Relative %: 48.1 %
Platelets: 260 10*3/uL (ref 140–400)
RBC: 4.39 10*6/uL (ref 3.80–5.10)
RDW: 14.4 % (ref 11.0–15.0)
Total Lymphocyte: 41.5 %
WBC: 7.8 10*3/uL (ref 3.8–10.8)

## 2020-06-17 LAB — COMPLETE METABOLIC PANEL WITH GFR
AG Ratio: 1.5 (calc) (ref 1.0–2.5)
ALT: 19 U/L (ref 6–29)
AST: 19 U/L (ref 10–35)
Albumin: 4.1 g/dL (ref 3.6–5.1)
Alkaline phosphatase (APISO): 53 U/L (ref 37–153)
BUN: 16 mg/dL (ref 7–25)
CO2: 31 mmol/L (ref 20–32)
Calcium: 9.6 mg/dL (ref 8.6–10.4)
Chloride: 104 mmol/L (ref 98–110)
Creat: 0.66 mg/dL (ref 0.60–0.93)
GFR, Est African American: 103 mL/min/{1.73_m2} (ref >=60)
GFR, Est Non African American: 89 mL/min/{1.73_m2} (ref >=60)
Globulin: 2.8 g/dL (calc) (ref 1.9–3.7)
Glucose, Bld: 96 mg/dL (ref 65–99)
Potassium: 4.3 mmol/L (ref 3.5–5.3)
Sodium: 139 mmol/L (ref 135–146)
Total Bilirubin: 0.6 mg/dL (ref 0.2–1.2)
Total Protein: 6.9 g/dL (ref 6.1–8.1)

## 2020-06-17 LAB — VITAMIN D 25 HYDROXY (VIT D DEFICIENCY, FRACTURES): Vit D, 25-Hydroxy: 82 ng/mL (ref 30–100)

## 2020-06-17 LAB — HEMOGLOBIN A1C
Hgb A1c MFr Bld: 5.9 % of total Hgb — ABNORMAL HIGH (ref <5.7)
Mean Plasma Glucose: 123 (calc)
eAG (mmol/L): 6.8 (calc)

## 2020-06-17 LAB — URINALYSIS, ROUTINE W REFLEX MICROSCOPIC
Bilirubin Urine: NEGATIVE
Glucose, UA: NEGATIVE
Hgb urine dipstick: NEGATIVE
Ketones, ur: NEGATIVE
Leukocytes,Ua: NEGATIVE
Nitrite: NEGATIVE
Protein, ur: NEGATIVE
Specific Gravity, Urine: 1.016 (ref 1.001–1.03)
pH: 6.5 (ref 5.0–8.0)

## 2020-06-17 LAB — MAGNESIUM: Magnesium: 1.9 mg/dL (ref 1.5–2.5)

## 2020-06-17 LAB — TSH: TSH: 2.1 mIU/L (ref 0.40–4.50)

## 2020-06-22 DIAGNOSIS — M47812 Spondylosis without myelopathy or radiculopathy, cervical region: Secondary | ICD-10-CM | POA: Diagnosis not present

## 2020-06-22 DIAGNOSIS — M25512 Pain in left shoulder: Secondary | ICD-10-CM | POA: Diagnosis not present

## 2020-06-22 DIAGNOSIS — M542 Cervicalgia: Secondary | ICD-10-CM | POA: Diagnosis not present

## 2020-06-23 DIAGNOSIS — I739 Peripheral vascular disease, unspecified: Secondary | ICD-10-CM | POA: Diagnosis not present

## 2020-07-04 DIAGNOSIS — M25512 Pain in left shoulder: Secondary | ICD-10-CM | POA: Diagnosis not present

## 2020-07-04 DIAGNOSIS — M542 Cervicalgia: Secondary | ICD-10-CM | POA: Diagnosis not present

## 2020-07-05 ENCOUNTER — Ambulatory Visit
Admission: RE | Admit: 2020-07-05 | Discharge: 2020-07-05 | Disposition: A | Discharge status: Home / Self Care | Payer: Medicare PPO | Source: Ambulatory Visit | Attending: Physician Assistant | Admitting: Physician Assistant

## 2020-07-05 ENCOUNTER — Other Ambulatory Visit: Payer: Self-pay

## 2020-07-05 DIAGNOSIS — R911 Solitary pulmonary nodule: Secondary | ICD-10-CM | POA: Diagnosis not present

## 2020-07-05 DIAGNOSIS — I700 Atherosclerosis of aorta: Secondary | ICD-10-CM | POA: Diagnosis not present

## 2020-07-05 MED ORDER — IOPAMIDOL (ISOVUE-300) INJECTION 61%
75.0000 mL | Freq: Once | INTRAVENOUS | Status: AC | PRN
  Administered 2020-07-05: 75 mL via INTRAVENOUS

## 2020-07-16 DIAGNOSIS — M542 Cervicalgia: Secondary | ICD-10-CM | POA: Diagnosis not present

## 2020-07-22 DIAGNOSIS — M5030 Other cervical disc degeneration, unspecified cervical region: Secondary | ICD-10-CM | POA: Diagnosis not present

## 2020-07-25 DIAGNOSIS — M25562 Pain in left knee: Secondary | ICD-10-CM | POA: Diagnosis not present

## 2020-07-25 DIAGNOSIS — M2392 Unspecified internal derangement of left knee: Secondary | ICD-10-CM | POA: Diagnosis not present

## 2020-07-25 DIAGNOSIS — M238X2 Other internal derangements of left knee: Secondary | ICD-10-CM | POA: Diagnosis not present

## 2020-07-25 DIAGNOSIS — M1712 Unilateral primary osteoarthritis, left knee: Secondary | ICD-10-CM | POA: Diagnosis not present

## 2020-08-13 DIAGNOSIS — G4733 Obstructive sleep apnea (adult) (pediatric): Secondary | ICD-10-CM | POA: Diagnosis not present

## 2020-08-15 ENCOUNTER — Encounter: Payer: Self-pay | Admitting: Family Medicine

## 2020-08-15 ENCOUNTER — Other Ambulatory Visit: Payer: Self-pay

## 2020-08-15 ENCOUNTER — Ambulatory Visit (INDEPENDENT_AMBULATORY_CARE_PROVIDER_SITE_OTHER): Payer: Medicare PPO | Admitting: Family Medicine

## 2020-08-15 VITALS — BP 172/86 | HR 70 | Ht 63.0 in | Wt 232.0 lb

## 2020-08-15 DIAGNOSIS — Z9989 Dependence on other enabling machines and devices: Secondary | ICD-10-CM | POA: Diagnosis not present

## 2020-08-15 DIAGNOSIS — R0989 Other specified symptoms and signs involving the circulatory and respiratory systems: Secondary | ICD-10-CM

## 2020-08-15 DIAGNOSIS — G4733 Obstructive sleep apnea (adult) (pediatric): Secondary | ICD-10-CM | POA: Diagnosis not present

## 2020-08-15 NOTE — Patient Instructions (Signed)
Please continue using your CPAP regularly. While your insurance requires that you use CPAP at least 4 hours each night on 70% of the nights, I recommend, that you not skip any nights and use it throughout the night if you can. Getting used to CPAP and staying with the treatment long term does take time and patience and discipline. Untreated obstructive sleep apnea when it is moderate to severe can have an adverse impact on cardiovascular health and raise her risk for heart disease, arrhythmias, hypertension, congestive heart failure, stroke and diabetes. Untreated obstructive sleep apnea causes sleep disruption, nonrestorative sleep, and sleep deprivation. This can have an impact on your day to day functioning and cause daytime sleepiness and impairment of cognitive function, memory loss, mood disturbance, and problems focussing. Using CPAP regularly can improve these symptoms.  Please keep an eye on your blood pressures at home. Call PCP immediately if blood pressure remains elevated (greater then 150/90)   Follow up in 1 year  Sleep Apnea Sleep apnea affects breathing during sleep. It causes breathing to stop for a short time or to become shallow. It can also increase the risk of:  Heart attack.  Stroke.  Being very overweight (obese).  Diabetes.  Heart failure.  Irregular heartbeat. The goal of treatment is to help you breathe normally again. What are the causes? There are three kinds of sleep apnea:  Obstructive sleep apnea. This is caused by a blocked or collapsed airway.  Central sleep apnea. This happens when the brain does not send the right signals to the muscles that control breathing.  Mixed sleep apnea. This is a combination of obstructive and central sleep apnea. The most common cause of this condition is a collapsed or blocked airway. This can happen if:  Your throat muscles are too relaxed.  Your tongue and tonsils are too large.  You are overweight.  Your airway  is too small. What increases the risk?  Being overweight.  Smoking.  Having a small airway.  Being older.  Being female.  Drinking alcohol.  Taking medicines to calm yourself (sedatives or tranquilizers).  Having family members with the condition. What are the signs or symptoms?  Trouble staying asleep.  Being sleepy or tired during the day.  Getting angry a lot.  Loud snoring.  Headaches in the morning.  Not being able to focus your mind (concentrate).  Forgetting things.  Less interest in sex.  Mood swings.  Personality changes.  Feelings of sadness (depression).  Waking up a lot during the night to pee (urinate).  Dry mouth.  Sore throat. How is this diagnosed?  Your medical history.  A physical exam.  A test that is done when you are sleeping (sleep study). The test is most often done in a sleep lab but may also be done at home. How is this treated?   Sleeping on your side.  Using a medicine to get rid of mucus in your nose (decongestant).  Avoiding the use of alcohol, medicines to help you relax, or certain pain medicines (narcotics).  Losing weight, if needed.  Changing your diet.  Not smoking.  Using a machine to open your airway while you sleep, such as: ? An oral appliance. This is a mouthpiece that shifts your lower jaw forward. ? A CPAP device. This device blows air through a mask when you breathe out (exhale). ? An EPAP device. This has valves that you put in each nostril. ? A BPAP device. This device blows air through  a mask when you breathe in (inhale) and breathe out.  Having surgery if other treatments do not work. It is important to get treatment for sleep apnea. Without treatment, it can lead to:  High blood pressure.  Coronary artery disease.  In men, not being able to have an erection (impotence).  Reduced thinking ability. Follow these instructions at home: Lifestyle  Make changes that your doctor  recommends.  Eat a healthy diet.  Lose weight if needed.  Avoid alcohol, medicines to help you relax, and some pain medicines.  Do not use any products that contain nicotine or tobacco, such as cigarettes, e-cigarettes, and chewing tobacco. If you need help quitting, ask your doctor. General instructions  Take over-the-counter and prescription medicines only as told by your doctor.  If you were given a machine to use while you sleep, use it only as told by your doctor.  If you are having surgery, make sure to tell your doctor you have sleep apnea. You may need to bring your device with you.  Keep all follow-up visits as told by your doctor. This is important. Contact a doctor if:  The machine that you were given to use during sleep bothers you or does not seem to be working.  You do not get better.  You get worse. Get help right away if:  Your chest hurts.  You have trouble breathing in enough air.  You have an uncomfortable feeling in your back, arms, or stomach.  You have trouble talking.  One side of your body feels weak.  A part of your face is hanging down. These symptoms may be an emergency. Do not wait to see if the symptoms will go away. Get medical help right away. Call your local emergency services (911 in the [place] Do not drive yourself to the hospital. Summary  This condition affects breathing during sleep.  The most common cause is a collapsed or blocked airway.  The goal of treatment is to help you breathe normally while you sleep. This information is not intended to replace advice given to you by your health care provider. Make sure you discuss any questions you have with your health care provider. Document Revised: 09/19/2018 Document Reviewed: 07/29/2018 Elsevier Patient Education  [place].

## 2020-08-15 NOTE — Progress Notes (Signed)
PATIENT: Anita Blake DOB: 1946-05-16  REASON FOR VISIT: follow up HISTORY FROM: patient  Chief Complaint  Patient presents with  . Follow-up    cpap fu, pt states she is doing well with cpap   . room 1     HISTORY OF PRESENT ILLNESS: Today 08/16/20 Anita Blake is a 74 y.o. female here today for follow up for OSA on CPAP. She reports that she is doing well. She admits that she does not take her CPAP machine when traveling. She and her husband both use CPAP and she feels that it is too much to worry with when traveling. She uses CPAP consistently when home. She denies concerns with supplies or machine. She does continue to note benefit of therapy with improved sleep quality and improvement in nocturia when using CPAP.   Compliance report dated 07/16/2020 through 08/14/2020 reveals that she used CPAP 25 of the past 30 days for compliance of 83%.  She used CPAP greater than 4 hours 25 of the past 30 days for compliance of 83%.  Average usage on days used was 8 hours and 11 minutes.  Residual AHI was 1.8 on 5 to 12 cm of water and an EPR of 3.  There was no significant leak noted.  HISTORY: (copied from my note on 08/13/2019)  Anita Blake is a 74 y.o. female here today for follow up of OSA on CPAP.  She reports that she has been doing much better on CPAP therapy.  She is able to tolerate therapy.  She has noted that she is sleeping better.  She is not waking to urinate in the middle the night any longer.  She received new supplies last week.  Compliance report reveals that she has used CPAP 29 of the last 30 days for compliance of 97%.  28 of these days she used CPAP greater than 4 hours for compliance of 93%.  Average usage was 7 hours and 47 minutes.  AHI was 1.3 on 5 to 12 cm of water and EPR of 3.  There was no significant leak.  HISTORY: (copied from my note on 06/18/2019)  Anita Blake a 74 y.o.femalehere today for follow up for OSA on CPAP.Mrs. Schaad returns today for  initial CPAP compliance report. She reports some obstacles in her way of using CPAP nightly. She has been concerned about daily cleaning process. She states that she did not have time in her day to clean her machine every day. If she is unable to clean the machine she will not use CPAP. She also states that her husband is ill. Upon nights where he is not feeling well she is up and down so much she does not feel that it is useful to use CPAP. She also has a dog. She reports that he does not sleep well if he is not wearing his pajamas. We have reviewed her compliance report dated 05/18/2019 through 06/16/2019. She has used CPAP 15 out of the last 30 days for compliance of 50%. 13 of those days were greater than 4 hours for compliance of 43%. AHI was 7.9 on 5 to 12 cm of water. There was a mild leak in the 95th percentile of 21.1. She does note that when she is using CPAP it helps significantly with GERD. She also sleeps better at night. She wakes less frequently.  HISTORY: (copied fromDr Dohmeier'snote on 01/19/2019)  Anita Blake a 74 y.o.femalepatient, seenon 02-03-2020in a referral from Dr. Everardo All  evaluation of fragmented sleep, daytime fatigue and sleepiness, in the setting of anemia and possibly apnea.  Chief complaint according to patient :pt states that all her life she has slept in 2 hr increments. she has been told she snores in sleep. pt has never had a sleep study. pt complaints of fatigue during the day. she states that she always felt like she pushes herself to stay awake. PCP sent her because laley the energy level had been severely decreased. recently started iron supplement due to low iron and she has felt that has gotten better.  Sleep habits are as follows: The patient adheres to a bedtime of 11.30 PM, and goes to sleep within 30 minutes - She describes her marital bedroom as cool, quiet and dark. She sleeps on her sides, but often changes position - she  has an adjustable bed and sleeps on 1 or 2 pillows.  Her husband has noted her to snore. She reports very vivid dreams, has one nocturia and will continue her last dream. Seems to dream every night. She also has hiatal hernia GERD causing some sleep interruption. No headaches, she wakes up every 2 hours , looks at the clock. She wakes at 4. 14 and 6. 31 AM , she rises between 7.31 and 8 .20 AM. She never used an alarm! " I Have never overslept" .   Sleep medical history: hip replacement and knee problem have caused her to sleep more in supine or avoiding the left side. Obesity at BMI 41. Snoring has been present for longer than a decade. NASH. Anemia with borderline iron levels.   Familymedical/sleep history:mother died at 57 of ESRD, father at age 76 of CVA>  Social history:married, never smoked, none drinker, caffeine - none - no tea, coffee, soda.  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  ESS: 1 FSS: 9  ALLERGIES: Allergies  Allergen Reactions  . Anesthetics, Halogenated     Difficulty with waking up from anesthesia from [facility], had aphasia, negative CT  . Crestor [Rosuvastatin]   . Lopid [Gemfibrozil]   . Latex Rash and Swelling    [Rash] redness/swelling/rash    HOME MEDICATIONS: Outpatient Medications Prior to Visit  Medication Sig Dispense Refill  . aspirin 81 MG tablet Take 81 mg by mouth daily.    . Cholecalciferol (VITAMIN D PO) Take 10,000 Int'l Units by mouth daily.     . famotidine (PEPCID) 40 MG tablet Take 1 tablet (40 mg total) by mouth daily as needed for heartburn or indigestion. 180 tablet 1  . losartan-hydrochlorothiazide (HYZAAR) 100-12.5 MG tablet Take 1 tablet every  Morning for BP 90 tablet 0  . MAGNESIUM PO Take 500 mg by mouth.     . Pitavastatin Calcium (LIVALO) 4 MG TABS Take 1 tablet (4 mg total) by mouth at bedtime. 90 tablet 1  . Probiotic Product (PROBIOTIC DAILY PO) Take by mouth daily.    . psyllium (METAMUCIL) 58.6 % powder Take 1 packet by mouth daily.     No facility-administered medications prior to visit.    PAST MEDICAL HISTORY: Past Medical History:  Diagnosis Date  . Anemia yrs ago  . Arthritis   . Complication of anesthesia    slow to wake up  .  GERD (gastroesophageal reflux disease)   . HLD (hyperlipidemia)   . Hyperlipidemia   . Labile hypertension    no meds  . Nonalcoholic steatohepatitis (NASH)   . Prediabetes   . Vitamin D deficiency     PAST SURGICAL HISTORY: Past Surgical History:  Procedure Laterality Date  . ABDOMINAL HYSTERECTOMY     partial, ovaries remain  . cataract surgery Left   . EYE SURGERY Left 2007   macular hole in retina  . index finger surgery Right   . right 5th digit finger - cyst removal Right 06/04/2019   Dr. Apolonio Schneiders  . right arm elbow surgery     ligament repair  . TOTAL HIP ARTHROPLASTY Left 10/06/2013   Procedure: LEFT TOTAL HIP ARTHROPLASTY ANTERIOR APPROACH;  Surgeon: Mauri Pole, MD;  Location: [facility];  Service: Orthopedics;  Laterality: Left;    FAMILY HISTORY: Family History  Problem Relation Age of Onset  . Heart disease Mother        Vague  . Kidney failure Mother   . Stroke Father   . Alzheimer's disease Other     SOCIAL HISTORY: Social History   Socioeconomic History  . Marital status: Married    Spouse name: Not on file  . Number of children: 1  . Years of education: Not on file  . Highest education level: Not on file  Occupational History  . Occupation: Retired  Tobacco Use  . Smoking status: Never Smoker  . Smokeless tobacco: Never Used  Vaping Use  . Vaping Use: Never used  Substance and Sexual Activity  . Alcohol use: No  . Drug use: No  .  Sexual activity: Not Currently  Other Topics Concern  . Not on file  Social History Narrative   Lives with husband.     Social Determinants of Health   Financial Resource Strain:   . Difficulty of Paying Living Expenses: Not on file  Food Insecurity:   . Worried About Charity fundraiser in the Last Year: Not on file  . Ran Out of Food in the Last Year: Not on file  Transportation Needs:   . Lack of Transportation (Medical): Not on file  . Lack of Transportation (Non-Medical): Not on file  Physical Activity:   . Days of Exercise per Week: Not on file  . Minutes of Exercise per Session: Not on file  Stress:   . Feeling of Stress : Not on file  Social Connections:   . Frequency of Communication with Friends and Family: Not on file  . Frequency of Social Gatherings with Friends and Family: Not on file  . Attends Religious Services: Not on file  . Active Member of Clubs or Organizations: Not on file  . Attends Archivist Meetings: Not on file  . Marital Status: Not on file  Intimate Partner Violence:   . Fear of Current or Ex-Partner: Not on file  . Emotionally Abused: Not on file  . Physically Abused: Not on file  . Sexually Abused: Not on file      PHYSICAL EXAM  Vitals:   08/15/20 1440  BP: (!) 172/86  Pulse: 70  Weight: 232 lb (105.2 kg)  Height: 5' 3"  (1.6 m)   Body mass index is 41.1 kg/m.  Generalized: Well developed, in no acute distress  Cardiology: normal rate and rhythm, no murmur noted Respiratory: clear to auscultation bilaterally  Neurological examination  Mentation: Alert oriented to time, place, history taking. Follows all commands speech  and language fluent Cranial nerve II-XII: Pupils were equal round reactive to light. Extraocular movements were full, visual field were full  Motor: The motor testing reveals 5 over 5 strength of all 4 extremities. Good symmetric motor tone is noted throughout.  Gait and station: Gait is normal.     DIAGNOSTIC DATA (LABS, IMAGING, TESTING) - I reviewed patient records, labs, notes, testing and imaging myself where available.  No flowsheet data found.   Lab Results  Component Value Date   WBC 7.8 06/16/2020   HGB 11.1 (L) 06/16/2020   HCT 35.2 06/16/2020   MCV 80.2 06/16/2020   PLT 260 06/16/2020      Component Value Date/Time   NA 139 06/16/2020 0909   K 4.3 06/16/2020 0909   CL 104 06/16/2020 0909   CO2 31 06/16/2020 0909   GLUCOSE 96 06/16/2020 0909   BUN 16 06/16/2020 0909   CREATININE 0.66 06/16/2020 0909   CALCIUM 9.6 06/16/2020 0909   PROT 6.9 06/16/2020 0909   ALBUMIN 4.0 05/16/2017 1033   AST 19 06/16/2020 0909   ALT 19 06/16/2020 0909   ALKPHOS 62 05/16/2017 1033   BILITOT 0.6 06/16/2020 0909   GFRNONAA 89 06/16/2020 0909   GFRAA 103 06/16/2020 0909   Lab Results  Component Value Date   CHOL 165 06/16/2020   HDL 58 06/16/2020   LDLCALC 88 06/16/2020   TRIG 101 06/16/2020   CHOLHDL 2.8 06/16/2020   Lab Results  Component Value Date   HGBA1C 5.9 (H) 06/16/2020   Lab Results  Component Value Date   [id number] 814 05/27/2018   Lab Results  Component Value Date   TSH 2.10 06/16/2020       ASSESSMENT AND PLAN 74 y.o. year old female  has a past medical history of Anemia (yrs ago), Arthritis, Complication of anesthesia, GERD (gastroesophageal reflux disease), HLD (hyperlipidemia), Hyperlipidemia, Labile hypertension, Nonalcoholic steatohepatitis (NASH), Prediabetes, and Vitamin D deficiency. here with     ICD-10-CM   1. OSA on CPAP  G47.33 For home use only DME continuous positive airway pressure (CPAP)   Z99.89   2. Labile hypertension  R09.89     Ms. Corbridge is doing well, today.  Compliance report reveals optimal daily and 4-hour compliance.  Time missed was while traveling.  We have discussed possibility of using a travel machine, however, she is not interested at this time.  I have encouraged her to continue using CPAP nightly and for 4  hours each night.  We have discussed blood pressure reading in the room today.  She reports labile readings and is followed closely by primary care.  She feels that blood pressure is elevated today specifically due to being late and uncomfortable in office settings.  Typical readings are 130/70.  She will continue to follow-up very closely with primary care.  Fortunately she is asymptomatic today.  She will follow up with me in 1 year, sooner if needed.  She verbalizes understanding and agreement with this plan.   Orders Placed This Encounter  Procedures  . For home use only DME continuous positive airway pressure (CPAP)    Supplies    Order Specific Question:   Length of Need    Answer:   Lifetime    Order Specific Question:   Patient has OSA or probable OSA    Answer:   Yes    Order Specific Question:   Is the patient currently using CPAP in the home    Answer:   Yes  Order Specific Question:   Settings    Answer:   Other see comments    Order Specific Question:   CPAP supplies needed    Answer:   Mask, headgear, cushions, filters, heated tubing and water chamber     No orders of the defined types were placed in this encounter.     I spent 15 minutes with the patient. 50% of this time was spent counseling and educating patient on plan of care and medications.    Debbora Presto, FNP-C 08/16/2020, 1:02 PM [facility] [address] [place], [place] [address] [phone]

## 2020-08-16 ENCOUNTER — Encounter: Payer: Self-pay | Admitting: Family Medicine

## 2020-08-16 NOTE — Progress Notes (Signed)
Order for cpap supplies sent to [facility] via community msg. Confirmation received that the order transmitted was successful.  

## 2020-08-29 DIAGNOSIS — H43811 Vitreous degeneration, right eye: Secondary | ICD-10-CM | POA: Diagnosis not present

## 2020-08-29 DIAGNOSIS — E119 Type 2 diabetes mellitus without complications: Secondary | ICD-10-CM | POA: Diagnosis not present

## 2020-08-29 DIAGNOSIS — H52203 Unspecified astigmatism, bilateral: Secondary | ICD-10-CM | POA: Diagnosis not present

## 2020-08-29 DIAGNOSIS — H25811 Combined forms of age-related cataract, right eye: Secondary | ICD-10-CM | POA: Diagnosis not present

## 2020-08-29 LAB — HM DIABETES EYE EXAM

## 2020-08-30 DIAGNOSIS — E785 Hyperlipidemia, unspecified: Secondary | ICD-10-CM | POA: Diagnosis not present

## 2020-08-30 DIAGNOSIS — E669 Obesity, unspecified: Secondary | ICD-10-CM | POA: Diagnosis not present

## 2020-08-30 DIAGNOSIS — K219 Gastro-esophageal reflux disease without esophagitis: Secondary | ICD-10-CM | POA: Diagnosis not present

## 2020-08-30 DIAGNOSIS — Z96649 Presence of unspecified artificial hip joint: Secondary | ICD-10-CM | POA: Diagnosis not present

## 2020-08-30 DIAGNOSIS — I10 Essential (primary) hypertension: Secondary | ICD-10-CM | POA: Diagnosis not present

## 2020-09-05 DIAGNOSIS — M5412 Radiculopathy, cervical region: Secondary | ICD-10-CM | POA: Diagnosis not present

## 2020-09-14 DIAGNOSIS — M5412 Radiculopathy, cervical region: Secondary | ICD-10-CM | POA: Diagnosis not present

## 2020-09-20 DIAGNOSIS — M5412 Radiculopathy, cervical region: Secondary | ICD-10-CM | POA: Diagnosis not present

## 2020-09-25 ENCOUNTER — Encounter: Payer: Self-pay | Admitting: Internal Medicine

## 2020-09-25 DIAGNOSIS — E1169 Type 2 diabetes mellitus with other specified complication: Secondary | ICD-10-CM | POA: Insufficient documentation

## 2020-09-25 NOTE — Progress Notes (Signed)
History of Present Illness:       This very nice 74 y.o.  MBF  presents for 3 month follow up with HTN, HLD, diet controlled T2DM  and Vitamin D Deficiency.   Patient gas GERD controlled on her meds.        Patient is treated for HTN (08/2019)  & BP has been controlled at home. Today's BP is at goal - 134/76. Patient has had no complaints of any cardiac type chest pain, palpitations, dyspnea / orthopnea / PND, dizziness, claudication, or dependent edema.       Hyperlipidemia is controlled with diet & Pitavastatin. Patient denies myalgias or other med SE's. Last Lipids were  At goal:  Lab Results  Component Value Date   CHOL 165 06/16/2020   HDL 58 06/16/2020   LDLCALC 88 06/16/2020   TRIG 101 06/16/2020   CHOLHDL 2.8 06/16/2020    Also, the patient has Morbid Obesity (BMI 444.5+) and consequent T2_NIDDM (A1c 6.5% /2015)  and has had no symptoms of reactive hypoglycemia, diabetic polys, paresthesias or visual blurring.  Last A1c was near goal:  Lab Results  Component Value Date   HGBA1C 5.9 (H) 06/16/2020       Further, the patient also has history of Vitamin D Deficiency ("41" /2016)  and supplements vitamin D without any suspected side-effects. Last vitamin D was at goal:   Lab Results  Component Value Date   VD25OH 82 06/16/2020    Current Outpatient Medications on File Prior to Visit  Medication Sig  . aspirin 81 MG tablet Take  daily.  Marland Kitchen VITAMIN D Take 10,000 Int'l Units  daily.   . famotidine 40 MG tablet Take 1 tablet  daily as needed for heartburn   . losartan-hctz 100-12.5 MG tablet Take 1 tablet every Morning   . MAGNESIUM PO Take 500 mg daily   . Pitavastatin (LIVALO) 4 MG TABS Take 1 tablet at bedtime.  . Probiotic  Take  daily.  Marland Kitchen METAMUCIL  Take 1 packet daily.    Allergies  Allergen Reactions  . Anesthetics, Halogenated     Difficulty with waking up from anesthesia from [facility], had aphasia, negative CT  . Crestor [Rosuvastatin]   . Lopid  [Gemfibrozil]   . Latex Rash and Swelling    [Rash] redness/swelling/rash    PMHx:   Past Medical History:  Diagnosis Date  . Anemia yrs ago  . Arthritis   . Complication of anesthesia    slow to wake up  . GERD (gastroesophageal reflux disease)   . HLD (hyperlipidemia)   . Hyperlipidemia   . Labile hypertension    no meds  . Nonalcoholic steatohepatitis (NASH)   . Prediabetes   . Vitamin D deficiency       Past Surgical History:  Procedure Laterality Date  . ABDOMINAL HYSTERECTOMY     partial, ovaries remain  . cataract surgery Left   . EYE SURGERY Left 2007   macular hole in retina  . index finger surgery Right   . right 5th digit finger - cyst removal Right 06/04/2019   Dr. Apolonio Schneiders  . right arm elbow surgery     ligament repair  . TOTAL HIP ARTHROPLASTY Left 10/06/2013   Procedure: LEFT TOTAL HIP ARTHROPLASTY ANTERIOR APPROACH;  Surgeon: Mauri Pole, MD;  Location: [facility];  Service: Orthopedics;  Laterality: Left;    FHx:    Reviewed / unchanged  SHx:    Reviewed / unchanged  Systems Review:  Constitutional: Denies fever, chills, wt changes, headaches, insomnia, fatigue, night sweats, change in appetite. Eyes: Denies redness, blurred vision, diplopia, discharge, itchy, watery eyes.  ENT: Denies discharge, congestion, post nasal drip, epistaxis, sore throat, earache, hearing loss, dental pain, tinnitus, vertigo, sinus pain, snoring.  CV: Denies chest pain, palpitations, irregular heartbeat, syncope, dyspnea, diaphoresis, orthopnea, PND, claudication or edema. Respiratory: denies cough, dyspnea, DOE, pleurisy, hoarseness, laryngitis, wheezing.  Gastrointestinal: Denies dysphagia, odynophagia, heartburn, reflux, water brash, abdominal pain or cramps, nausea, vomiting, bloating, diarrhea, constipation, hematemesis, melena, hematochezia  or hemorrhoids. Genitourinary: Denies dysuria, frequency, urgency, nocturia, hesitancy, discharge, hematuria or flank  pain. Musculoskeletal: Denies arthralgias, myalgias, stiffness, jt. swelling, pain, limping or strain/sprain.  Skin: Denies pruritus, rash, hives, warts, acne, eczema or change in skin lesion(s). Neuro: No weakness, tremor, incoordination, spasms, paresthesia or pain. Psychiatric: Denies confusion, memory loss or sensory loss. Endo: Denies change in weight, skin or hair change.  Heme/Lymph: No excessive bleeding, bruising or enlarged lymph nodes.  Physical Exam  BP 134/76   Pulse 66   Temp (!) 97 F (36.1 C)   Resp 16   Ht 5' 3.5" (1.613 m)   Wt 255 lb 6.4 oz (115.8 kg)   SpO2 99%   BMI 44.53 kg/m   Appears  well nourished, well groomed  and in no distress.  Eyes: PERRLA, EOMs, conjunctiva no swelling or erythema. Sinuses: No frontal/maxillary tenderness ENT/Mouth: EAC's clear, TM's nl w/o erythema, bulging. Nares clear w/o erythema, swelling, exudates. Oropharynx clear without erythema or exudates. Oral hygiene is good. Tongue normal, non obstructing. Hearing intact.  Neck: Supple. Thyroid not palpable. Car 2+/2+ without bruits, nodes or JVD. Chest: Respirations nl with BS clear & equal w/o rales, rhonchi, wheezing or stridor.  Cor: Heart sounds normal w/ regular rate and rhythm without sig. murmurs, gallops, clicks or rubs. Peripheral pulses normal and equal  without edema.  Abdomen: Soft & bowel sounds normal. Non-tender w/o guarding, rebound, hernias, masses or organomegaly.  Lymphatics: Unremarkable.  Musculoskeletal: Full ROM all peripheral extremities, joint stability, 5/5 strength and normal gait.  Skin: Warm, dry without exposed rashes, lesions or ecchymosis apparent.  Neuro: Cranial nerves intact, reflexes equal bilaterally. Sensory-motor testing grossly intact. Tendon reflexes grossly intact.  Pysch: Alert & oriented x 3.  Insight and judgement nl & appropriate. No ideations.  Assessment and Plan:  1. Essential hypertension  - Continue medication, monitor blood  pressure at home.  - Continue DASH diet.  Reminder to go to the ER if any CP,  SOB, nausea, dizziness, severe HA, changes vision/speech.  - CBC with Differential/Platelet - COMPLETE METABOLIC PANEL WITH GFR - Magnesium - TSH  2. Class 3 severe obesity due to excess calories with serious comorbidity  and body mass index (BMI) of 40.0 to 44.9 in adult (HCC)  - Lipid panel - TSH  3. T - Continue diet/meds, exercise,& lifestyle modifications.  - Continue monitor periodic cholesterol/liver & renal functions  ype 2 diabetes mellitus with hyperlipidemia ([place])  - Continue diet, exercise  - Lifestyle modifications.  - Monitor appropriate labs.  - Hemoglobin A1c - Insulin, random  4. Diabetes mellitus with coincident hypertension (HCC)  - Continue supplementation.  - Hemoglobin A1c - Insulin, random  5. Vitamin D deficiency  - VITAMIN D 25 Hydroxy  6. Medication management  - CBC with Differential/Platelet - COMPLETE METABOLIC PANEL WITH GFR - Magnesium - Lipid panel - TSH - Hemoglobin A1c - Insulin, random - VITAMIN D 25 Hydroxy  Discussed  regular exercise, BP monitoring, weight control to achieve/maintain BMI less than 25 and discussed med and SE's. Recommended labs to assess and monitor clinical status with further disposition pending results of labs.  I discussed the assessment and treatment plan with the patient. The patient was provided an opportunity to ask questions and all were answered. The patient agreed with the plan and demonstrated an understanding of the instructions.  I provided over 30 minutes of exam, counseling, chart review and  complex critical decision making.         The patient was advised to call back or seek an in-person evaluation if the symptoms worsen or if the condition fails to improve as anticipated.   Kirtland Bouchard, MD

## 2020-09-25 NOTE — Patient Instructions (Signed)

## 2020-09-26 ENCOUNTER — Ambulatory Visit (INDEPENDENT_AMBULATORY_CARE_PROVIDER_SITE_OTHER): Payer: Medicare PPO | Admitting: Internal Medicine

## 2020-09-26 ENCOUNTER — Other Ambulatory Visit: Payer: Self-pay

## 2020-09-26 ENCOUNTER — Encounter: Payer: Self-pay | Admitting: Internal Medicine

## 2020-09-26 VITALS — BP 134/76 | HR 66 | Temp 97.0°F | Resp 16 | Ht 63.5 in | Wt 225.4 lb

## 2020-09-26 DIAGNOSIS — Z79899 Other long term (current) drug therapy: Secondary | ICD-10-CM | POA: Diagnosis not present

## 2020-09-26 DIAGNOSIS — E785 Hyperlipidemia, unspecified: Secondary | ICD-10-CM | POA: Diagnosis not present

## 2020-09-26 DIAGNOSIS — Z6841 Body Mass Index (BMI) 40.0 and over, adult: Secondary | ICD-10-CM

## 2020-09-26 DIAGNOSIS — I10 Essential (primary) hypertension: Secondary | ICD-10-CM | POA: Diagnosis not present

## 2020-09-26 DIAGNOSIS — E119 Type 2 diabetes mellitus without complications: Secondary | ICD-10-CM

## 2020-09-26 DIAGNOSIS — E1169 Type 2 diabetes mellitus with other specified complication: Secondary | ICD-10-CM | POA: Diagnosis not present

## 2020-09-26 DIAGNOSIS — E559 Vitamin D deficiency, unspecified: Secondary | ICD-10-CM

## 2020-09-27 LAB — COMPLETE METABOLIC PANEL WITH GFR
AG Ratio: 1.6 (calc) (ref 1.0–2.5)
ALT: 20 U/L (ref 6–29)
AST: 18 U/L (ref 10–35)
Albumin: 4.4 g/dL (ref 3.6–5.1)
Alkaline phosphatase (APISO): 52 U/L (ref 37–153)
BUN: 16 mg/dL (ref 7–25)
CO2: 33 mmol/L — ABNORMAL HIGH (ref 20–32)
Calcium: 9.9 mg/dL (ref 8.6–10.4)
Chloride: 101 mmol/L (ref 98–110)
Creat: 0.74 mg/dL (ref 0.60–0.93)
GFR, Est African American: 94 mL/min/{1.73_m2} (ref >=60)
GFR, Est Non African American: 81 mL/min/{1.73_m2} (ref >=60)
Globulin: 2.7 g/dL (calc) (ref 1.9–3.7)
Glucose, Bld: 96 mg/dL (ref 65–99)
Potassium: 4.3 mmol/L (ref 3.5–5.3)
Sodium: 141 mmol/L (ref 135–146)
Total Bilirubin: 0.7 mg/dL (ref 0.2–1.2)
Total Protein: 7.1 g/dL (ref 6.1–8.1)

## 2020-09-27 LAB — HEMOGLOBIN A1C
Hgb A1c MFr Bld: 6 % of total Hgb — ABNORMAL HIGH (ref <5.7)
Mean Plasma Glucose: 126 (calc)
eAG (mmol/L): 7 (calc)

## 2020-09-27 LAB — VITAMIN D 25 HYDROXY (VIT D DEFICIENCY, FRACTURES): Vit D, 25-Hydroxy: 83 ng/mL (ref 30–100)

## 2020-09-27 LAB — CBC WITH DIFFERENTIAL/PLATELET
Absolute Monocytes: 447 cells/uL (ref 200–950)
Basophils Absolute: 28 cells/uL (ref 0–200)
Basophils Relative: 0.4 %
Eosinophils Absolute: 241 cells/uL (ref 15–500)
Eosinophils Relative: 3.4 %
HCT: 36.7 % (ref 35.0–45.0)
Hemoglobin: 11.6 g/dL — ABNORMAL LOW (ref 11.7–15.5)
Lymphs Abs: 2826 cells/uL (ref 850–3900)
MCH: 25.5 pg — ABNORMAL LOW (ref 27.0–33.0)
MCHC: 31.6 g/dL — ABNORMAL LOW (ref 32.0–36.0)
MCV: 80.7 fL (ref 80.0–100.0)
MPV: 11 fL (ref 7.5–12.5)
Monocytes Relative: 6.3 %
Neutro Abs: 3557 cells/uL (ref 1500–7800)
Neutrophils Relative %: 50.1 %
Platelets: 239 10*3/uL (ref 140–400)
RBC: 4.55 10*6/uL (ref 3.80–5.10)
RDW: 14.6 % (ref 11.0–15.0)
Total Lymphocyte: 39.8 %
WBC: 7.1 10*3/uL (ref 3.8–10.8)

## 2020-09-27 LAB — LIPID PANEL
Cholesterol: 185 mg/dL (ref <200)
HDL: 63 mg/dL (ref >=50)
LDL Cholesterol (Calc): 102 mg/dL (calc) — ABNORMAL HIGH
Non-HDL Cholesterol (Calc): 122 mg/dL (calc) (ref <130)
Total CHOL/HDL Ratio: 2.9 (calc) (ref <5.0)
Triglycerides: 108 mg/dL (ref <150)

## 2020-09-27 LAB — TSH: TSH: 1.68 mIU/L (ref 0.40–4.50)

## 2020-09-27 LAB — MAGNESIUM: Magnesium: 1.9 mg/dL (ref 1.5–2.5)

## 2020-09-27 LAB — INSULIN, RANDOM: Insulin: 14.2 u[IU]/mL

## 2020-09-27 NOTE — Progress Notes (Signed)
========================================================== -   Test results slightly outside the reference range are not unusual. If there is anything important, I will review this with you,  otherwise it is considered normal test values.  If you have further questions,  please do not hesitate to contact me at the office or via My Chart.  ==========================================================  -  CBC shows mild Chronic Anemia - which is stable ==========================================================  -  Total Chol = 185 and LDL Chol = 102 &Good HDL Chol= 63 - All  Excellent   - Very low risk for Heart Attack  / Stroke =============================================================  - A1c = 6.0-%  Blood sugar and A1c are STILL elevated in the borderline and  early or pre-diabetes range which has the same   300% increased risk for heart attack, stroke, cancer and   alzheimer- type vascular dementia as full blown diabetes.    -  It is very important that you work harder with diet by  avoiding all foods that are white except chicken,   fish & calliflower.  - Avoid white rice  (brown & wild rice is OK),   - Avoid white potatoes  (sweet potatoes in moderation is OK),   White bread or wheat bread or anything made out of   white flour like bagels, donuts, rolls, buns, biscuits, cakes,  - pastries, cookies, pizza crust, and pasta (made from  white flour & egg whites)   - vegetarian pasta or spinach or wheat pasta is OK.  - Multigrain breads like Arnold's, Pepperidge Farm or   multigrain sandwich thins or high fiber breads like   Eureka bread or "Dave's Killer" breads that are  4 to 5 grams fiber per slice !  are best.    Diet, exercise and weight loss can reverse and cure  diabetes in the early stages.   ==========================================================  -  Vitamin D = 83 - Excellent  ==========================================================  -  All Else -  CBC - Kidneys - Electrolytes - Liver - Magnesium & Thyroid    - all  Normal / OK ==========================================================   - Keep up the [place] Work ! ==========================================================

## 2020-09-30 DIAGNOSIS — M5412 Radiculopathy, cervical region: Secondary | ICD-10-CM | POA: Diagnosis not present

## 2020-10-06 DIAGNOSIS — M5412 Radiculopathy, cervical region: Secondary | ICD-10-CM | POA: Diagnosis not present

## 2020-10-18 ENCOUNTER — Encounter: Payer: Self-pay | Admitting: Internal Medicine

## 2020-10-26 ENCOUNTER — Other Ambulatory Visit: Payer: Self-pay | Admitting: Internal Medicine

## 2020-10-26 DIAGNOSIS — M79642 Pain in left hand: Secondary | ICD-10-CM | POA: Diagnosis not present

## 2020-11-03 DIAGNOSIS — Z1231 Encounter for screening mammogram for malignant neoplasm of breast: Secondary | ICD-10-CM | POA: Diagnosis not present

## 2020-11-03 LAB — HM MAMMOGRAPHY

## 2020-11-15 DIAGNOSIS — M79642 Pain in left hand: Secondary | ICD-10-CM | POA: Diagnosis not present

## 2020-12-05 ENCOUNTER — Other Ambulatory Visit: Payer: Self-pay | Admitting: Internal Medicine

## 2020-12-05 DIAGNOSIS — I10 Essential (primary) hypertension: Secondary | ICD-10-CM

## 2020-12-06 DIAGNOSIS — G4733 Obstructive sleep apnea (adult) (pediatric): Secondary | ICD-10-CM | POA: Diagnosis not present

## 2020-12-15 DIAGNOSIS — G5602 Carpal tunnel syndrome, left upper limb: Secondary | ICD-10-CM | POA: Diagnosis not present

## 2020-12-26 DIAGNOSIS — G5602 Carpal tunnel syndrome, left upper limb: Secondary | ICD-10-CM | POA: Diagnosis not present

## 2020-12-28 ENCOUNTER — Encounter: Payer: Self-pay | Admitting: Adult Health Nurse Practitioner

## 2020-12-28 ENCOUNTER — Ambulatory Visit (INDEPENDENT_AMBULATORY_CARE_PROVIDER_SITE_OTHER): Payer: Medicare PPO | Admitting: Adult Health Nurse Practitioner

## 2020-12-28 ENCOUNTER — Other Ambulatory Visit: Payer: Self-pay

## 2020-12-28 VITALS — BP 126/90 | HR 74 | Temp 97.5°F | Ht 63.5 in | Wt 231.0 lb

## 2020-12-28 DIAGNOSIS — E785 Hyperlipidemia, unspecified: Secondary | ICD-10-CM | POA: Diagnosis not present

## 2020-12-28 DIAGNOSIS — Z9989 Dependence on other enabling machines and devices: Secondary | ICD-10-CM

## 2020-12-28 DIAGNOSIS — Z0001 Encounter for general adult medical examination with abnormal findings: Secondary | ICD-10-CM

## 2020-12-28 DIAGNOSIS — H2510 Age-related nuclear cataract, unspecified eye: Secondary | ICD-10-CM | POA: Diagnosis not present

## 2020-12-28 DIAGNOSIS — Z0000 Encounter for general adult medical examination without abnormal findings: Secondary | ICD-10-CM

## 2020-12-28 DIAGNOSIS — R7309 Other abnormal glucose: Secondary | ICD-10-CM

## 2020-12-28 DIAGNOSIS — E119 Type 2 diabetes mellitus without complications: Secondary | ICD-10-CM | POA: Diagnosis not present

## 2020-12-28 DIAGNOSIS — R6889 Other general symptoms and signs: Secondary | ICD-10-CM | POA: Diagnosis not present

## 2020-12-28 DIAGNOSIS — K7581 Nonalcoholic steatohepatitis (NASH): Secondary | ICD-10-CM | POA: Diagnosis not present

## 2020-12-28 DIAGNOSIS — K449 Diaphragmatic hernia without obstruction or gangrene: Secondary | ICD-10-CM

## 2020-12-28 DIAGNOSIS — E1169 Type 2 diabetes mellitus with other specified complication: Secondary | ICD-10-CM | POA: Diagnosis not present

## 2020-12-28 DIAGNOSIS — E559 Vitamin D deficiency, unspecified: Secondary | ICD-10-CM | POA: Diagnosis not present

## 2020-12-28 DIAGNOSIS — K219 Gastro-esophageal reflux disease without esophagitis: Secondary | ICD-10-CM | POA: Diagnosis not present

## 2020-12-28 DIAGNOSIS — H35349 Macular cyst, hole, or pseudohole, unspecified eye: Secondary | ICD-10-CM | POA: Diagnosis not present

## 2020-12-28 DIAGNOSIS — I10 Essential (primary) hypertension: Secondary | ICD-10-CM

## 2020-12-28 DIAGNOSIS — I700 Atherosclerosis of aorta: Secondary | ICD-10-CM

## 2020-12-28 DIAGNOSIS — G4733 Obstructive sleep apnea (adult) (pediatric): Secondary | ICD-10-CM | POA: Diagnosis not present

## 2020-12-28 DIAGNOSIS — Z79899 Other long term (current) drug therapy: Secondary | ICD-10-CM

## 2020-12-28 MED ORDER — AMLODIPINE BESYLATE 2.5 MG PO TABS: 2.5000 mg | ORAL_TABLET | Freq: Every day | ORAL | 11 refills | Status: DC

## 2020-12-28 NOTE — Progress Notes (Signed)
MEDICARE ANNUAL WELLNESS   Assessment and Plan:  Encounter for Medicare Annual Wellness exam Follow up GYN, requested diabetes eye exam   Hypertension Elevated today but has been stable/at goal at home; Taking Losartan 186m / HCTZ 12.585mMonitor blood pressure at home; call if consistently over 130/80 Will follow up sooner in 3 months Continue DASH diet.   Reminder to go to the ER if any CP, SOB, nausea, dizziness, severe HA, changes vision/speech, left arm numbness and tingling and jaw pain. -     CBC with Differential/Platelet -     COMPLETE METABOLIC PANEL WITH GFR -     amLODipine (NORVASC) 2.5 MG tablet; Take 1 tablet (2.5 mg total) by mouth at bedtime.  Diabetes mellitus with coincident hypertension (HCChautauquaDiet Controlled  Discussed general issues about diabetes pathophysiology and management. Education: Reviewed 'ABCs' of diabetes management (respective goals in parentheses):  A1C (<7), blood pressure (<130/80), and cholesterol (LDL <70) Dietary recommendations Encouraged aerobic exercise.  Discussed foot care, check daily Yearly retinal exam Dental exam every 6 months Monitor blood glucose, discussed goal for patient   Hyperlipidemia associated with type 2 diabetes mellitus (HCAnnapolis NeckContinue medications: Pitavastatin [id number] bedtime Discussed dietary and exercise modifications Low fat diet -     Lipid panel  Hiatal hernia with GERD Doing well at this time Continue: Famotidine 56m88met discussed Monitor for triggers Avoid food with high acid content Avoid excessive cafeine Increase water intake  NASH (nonalcoholic steatohepatitis) -CMP Discussed dietary and exercise modifications   Age-related nuclear cataract, unspecified laterality Macular hole, unspecified laterality -Follow up with opthalmology Continue to monitor  Morbid obesity (BMI 42.79) Discussed dietary and exercise modifications  Vitamin D deficiency Continue supplementation to maintain goal  of 70-100 Taking Vitamin D 10,000 IU daily Defer vitamin D level  OSA on CPAP Continue CPAP/BiPAP, using nightly for at least 8 hours  Helping with daytime fatigue Weight loss still advised Discussed mask & tubing hygeine  Aortic atherosclerosis (HCC)[place] Control blood pressure, lipids and glucose Disscused lifestyle modifications, diet & exercise Continue to monitor  Abnormal glucose Discussed dietary and exercise modifications -     Hemoglobin A1c   Medication Management Continued   Discussed med's effects and SE's. Screening labs and tests as requested with regular follow-up as recommended. Over 30 minutes of face to face interview, exam, counseling, chart review, and complex, high level critical decision making was performed this visit.   Future Appointments  Date Time Provider [place]1/2022  9:30 AM CorbLiane Comber [facility] None  08/22/2021  7:30 AM LomaDebbora Presto [facility] None  01/01/2022  9:30 AM McClGarnet Sierras [facility] None    Plan:   During the course of the visit the patient was educated and counseled about appropriate screening and preventive services including:    Pneumococcal vaccine   Prevnar 13  Influenza vaccine  Td vaccine  Screening electrocardiogram  Bone densitometry screening  Colorectal cancer screening  Diabetes screening  Glaucoma screening  Nutrition counseling   Advanced directives: requested   HPI  74 y76. female  presents for a complete physical and follow up for has Class 3 severe obesity due to excess calories with serious comorbidity and body mass index (BMI) of 40.0 to 44.9 in adult ([facility]CCFlag[facility] Memorial Hospitalssential hypertension; Hyperlipemia; Hiatal hernia with GERD; Vitamin D deficiency; NASH (nonalcoholic steatohepatitis); Medication management; Macular hole; Cataract; OSA on CPAP; Prediabetes; Aortic atherosclerosis (HCC)[place] chest x-ray; and Type 2 diabetes mellitus with  hyperlipidemia  ([place]) on their problem list.   Reports overall she is doing well, she does not have any health or medication concerns today. She is married, 1 daughter, no grandkids but has "granddogs." She is primary caregiver for husband who is blind.  Had CXR for URI/cough sx 05/19/2020 - showed 1.2 cm rounded opacity of R medial lung apex, completed abx with stable opacity on follow up xray 06/14/2020, was recommended Ct chest for further evaluation which has been ordered and pending. Never smoker, sclerosis R sternovlavicular joint vs lung nodule. Denies cough, night sweats, fatigue, unintentional weight loss.   Has some plantar fascitis following with podiatry, had injection with some improvement, but still limiting walking some.   She has GERD consequent of hiatal hernia per GI, but feels well managed with lifestyle modification and famotidine 40 mg PRN only.  OSA on CPAP, endorses 100% compliance and restorative sleep.   BMI is Body mass index is 40.28 kg/m., she has been working on diet and exercise.  She is trying to get below 200 lb.  She walks dog daily, 30 min.  She eats minimal meat, pushes vegetables.  3-4 bottles of water daily.  Wt Readings from Last 3 Encounters:  12/28/20 231 lb (104.8 kg)  09/26/20 225 lb 6.4 oz (102.2 kg)  08/15/20 232 lb (105.2 kg)   Aortic atherosclerosis per CT 05/2020 and 07/05/20. Her blood pressure has not been controlled at home (has been 130s/70-80s), today their BP is BP: 126/90 She does workout. She denies chest pain, shortness of breath, dizziness, HA, blurry vision, diarrhea/GI sx.   She is on cholesterol medication (pitivastatin 2 mg daily only due to hx of myalgias with high dose statins) and denies myalgias. Her cholesterol is not at goal. The cholesterol last visit was:   Lab Results  Component Value Date   CHOL 199 12/28/2020   HDL 62 12/28/2020   LDLCALC 113 (H) 12/28/2020   TRIG 128 12/28/2020   CHOLHDL 3.2 12/28/2020   She has been working on  diet and exercise for prediabetes, she is on bASA, she is on ACE/ARB and denies foot ulcerations, increased appetite, nausea, paresthesia of the feet, polydipsia, polyuria, visual disturbances, vomiting and weight loss. Last A1C in the office was:  Lab Results  Component Value Date   HGBA1C 6.0 (H) 12/28/2020   Last GFR: Lab Results  Component Value Date   GFRAA 103 12/28/2020   Patient is on Vitamin D supplement.   Lab Results  Component Value Date   VD25OH 83 09/26/2020     She has fatty liver per [place] 2012 with mild stable LFT elevations monitored  Lab Results  Component Value Date   ALT 32 (H) 12/28/2020   AST 37 (H) 12/28/2020   ALKPHOS 62 05/16/2017   BILITOT 0.7 12/28/2020    Current Medications:  Current Outpatient Medications on File Prior to Visit  Medication Sig Dispense Refill  . aspirin 81 MG tablet Take 81 mg by mouth daily.    . Cholecalciferol (VITAMIN D PO) Take 10,000 Int'l Units by mouth daily.     . famotidine (PEPCID) 40 MG tablet TAKE 1 TABLET(40 MG) BY MOUTH TWICE DAILY 180 tablet 1  . losartan-hydrochlorothiazide (HYZAAR) 100-12.5 MG tablet TAKE 1 TABLET BY MOUTH EVERY MORNING FOR BLOOD PRESSURE 90 tablet 0  . MAGNESIUM PO Take 500 mg by mouth.     . Pitavastatin Calcium (LIVALO) 4 MG TABS Take 1 tablet (4 mg total) by mouth at bedtime. [place]  tablet 1  . Probiotic Product (PROBIOTIC DAILY PO) Take by mouth daily.    . psyllium (METAMUCIL) 58.6 % powder Take 1 packet by mouth daily.     No current facility-administered medications on file prior to visit.   Allergies:  Allergies  Allergen Reactions  . Anesthetics, Halogenated     Difficulty with waking up from anesthesia from [facility], had aphasia, negative CT  . Crestor [Rosuvastatin]   . Lopid [Gemfibrozil]   . Latex Rash and Swelling    [Rash] redness/swelling/rash   Medical History:  She has Class 3 severe obesity due to excess calories with serious comorbidity and body mass index (BMI) of 40.0 to 44.9  in adult [facility]); Essential hypertension; Hyperlipemia; Hiatal hernia with GERD; Vitamin D deficiency; NASH (nonalcoholic steatohepatitis); Medication management; Macular hole; Cataract; OSA on CPAP; Prediabetes; Aortic atherosclerosis ([place]); Abnormal chest x-ray; and Type 2 diabetes mellitus with hyperlipidemia (HCC) on their problem list.   MEDICARE WELLNESS OBJECTIVES: Physical activity: Current Exercise Habits: Home exercise routine, Type of exercise: walking, Time (Minutes): 30, Frequency (Times/Week): 7, Weekly Exercise (Minutes/Week): 210, Intensity: Mild Cardiac risk factors: Cardiac Risk Factors include: advanced age (>12mn, >>86women);diabetes mellitus;dyslipidemia;hypertension;obesity (BMI >30kg/m2) Depression/mood screen:   [facility] 12/28/2020  Decreased Interest 0  Down, Depressed, Hopeless 0  PHQ - 2 Score 0    ADLs:  In your present state of health, do you have any difficulty performing the following activities: 12/28/2020 03/16/2020  Hearing? N N  Vision? N N  Difficulty concentrating or making decisions? N N  Walking or climbing stairs? N N  Dressing or bathing? N N  Doing errands, shopping? N N  Preparing Food and eating ? N -  Using the Toilet? N -  In the past six months, have you accidently leaked urine? N -  Do you have problems with loss of bowel control? N -  Managing your Medications? N -  Managing your Finances? N -  Housekeeping or managing your Housekeeping? N -  Some recent data might be hidden     Cognitive Testing  Alert? Yes  Normal Appearance?Yes  Oriented to person? Yes  Place? Yes   Time? Yes  Recall of three objects?  Yes  Can perform simple calculations? Yes  Displays appropriate judgment?Yes  Can read the correct time from a watch face?Yes  EOL planning: Does Patient Have a Medical Advance Directive?: Yes Type of Advance Directive: HSmolanwill CBeallsvillein Chart?: No -  copy requested    Health Maintenance:   Immunization History  Administered Date(s) Administered  . DT (Pediatric) 05/10/2015  . PFIZER(Purple Top)SARS-COV-2 Vaccination 01/30/2020, 02/24/2020  . Td 07/27/2005   Preventative care: cologuard 06/2019 negative Colonoscopy 2017 Dr. MEarlean Shawl normal EGD 2017   Last mammogram:  10/2020 Yearly  MRI breasts: 02/2018, normal, resume 3D mammograms Last pap smear/pelvic exam: 2020 - by OBGYN - sees annually DEXA: 09/2019 managed by OBGYN  CT head 09/2013 MRI head: 12/2017 [place] 2012 [place] 2011 Stress test 05/2016 CXR 05/2020 Sleep study 02/2019  Prior vaccinations: TD or Tdap: 2016 Influenza: declines Pneumococcal: declines Prevnar13: declines Shingles/Zostavax: declines COVID completed 02/2020  Names of Other Physician/Practitioners you currently use: 1. [facility] here for primary care 2. Dr. SKathrin Penner Dr. GRenford Dills eye doctor, wears glasses 08/2020 abstracted, has seen in 2021, requested note for diabetes eye exam  3. Dr. MRandol Kern Dr. LRayann Heman dentist, last visit 2021 -  sees q3 month   Patient Care Team: Unk Pinto, MD as PCP - General (Internal Medicine) Richmond Campbell, MD as Consulting Physician (Gastroenterology) Delila Pereyra, MD as Consulting Physician (Gynecology) Paralee Cancel, MD as Consulting Physician (Orthopedic Surgery)  Surgical History:  She has a past surgical history that includes Abdominal hysterectomy; Eye surgery (Left, 2007); cataract surgery (Left); right arm elbow surgery; index finger surgery (Right); Total hip arthroplasty (Left, 10/06/2013); and right 5th digit finger - cyst removal (Right, 06/04/2019). Family History:  Herfamily history includes Alzheimer's disease in an other family member; Heart disease in her mother; Kidney failure in her mother; Stroke in her father. Social History:  She reports that she has never smoked. She has never used  smokeless tobacco. She reports that she does not drink alcohol and does not use drugs.   MEDICARE WELLNESS OBJECTIVES: Physical activity: Current Exercise Habits: Home exercise routine, Type of exercise: walking, Time (Minutes): 30, Frequency (Times/Week): 7, Weekly Exercise (Minutes/Week): 210, Intensity: Mild Cardiac risk factors: Cardiac Risk Factors include: advanced age (>60mn, >>65women);diabetes mellitus;dyslipidemia;hypertension;obesity (BMI >30kg/m2) Depression/mood screen:   Dep[facility] 12/28/2020  Decreased Interest 0  Down, Depressed, Hopeless 0  PHQ - 2 Score 0    ADLs:  In your present state of health, do you have any difficulty performing the following activities: 12/28/2020 03/16/2020  Hearing? N N  Vision? N N  Difficulty concentrating or making decisions? N N  Walking or climbing stairs? N N  Dressing or bathing? N N  Doing errands, shopping? N N  Preparing Food and eating ? N -  Using the Toilet? N -  In the past six months, have you accidently leaked urine? N -  Do you have problems with loss of bowel control? N -  Managing your Medications? N -  Managing your Finances? N -  Housekeeping or managing your Housekeeping? N -  Some recent data might be hidden     Cognitive Testing  Alert? Yes  Normal Appearance?Yes  Oriented to person? Yes  Place? Yes   Time? Yes  Recall of three objects?  Yes  Can perform simple calculations? Yes  Displays appropriate judgment?Yes  Can read the correct time from a watch face?Yes  EOL planning: Does Patient Have a Medical Advance Directive?: Yes Type of Advance Directive: HBeverlywill CFarmlandin Chart?: No - copy requested   Review of Systems: Review of Systems  Constitutional: Negative for malaise/fatigue and weight loss.  HENT: Negative for hearing loss and tinnitus.   Eyes: Negative for blurred vision and double vision.  Respiratory: Negative for  cough, sputum production, shortness of breath and wheezing.   Cardiovascular: Negative for chest pain, palpitations, orthopnea, claudication, leg swelling and PND.  Gastrointestinal: Negative for abdominal pain, blood in stool, constipation, diarrhea, heartburn, melena, nausea and vomiting.  Genitourinary: Negative.   Musculoskeletal: Negative for falls, joint pain and myalgias.  Skin: Negative for rash.  Neurological: Negative for dizziness, tingling, sensory change, weakness and headaches.  Endo/Heme/Allergies: Negative for polydipsia.  Psychiatric/Behavioral: Negative.  Negative for depression, memory loss, substance abuse and suicidal ideas. The patient is not nervous/anxious and does not have insomnia.   All other systems reviewed and are negative.   Physical Exam: Estimated body mass index is 40.28 kg/m as calculated from the following:   Height as of this encounter: 5' 3.5" (1.613 m).   Weight as of this encounter: 231 lb (104.8 kg). BP 126/90  Pulse 74   Temp (!) 97.5 F (36.4 C)   Ht 5' 3.5" (1.613 m)   Wt 231 lb (104.8 kg)   SpO2 96%   BMI 40.28 kg/m  General Appearance: Well nourished, in no apparent distress.  Eyes: PERRLA, EOMs, conjunctiva no swelling or erythema,  Sinuses: No Frontal/maxillary tenderness  ENT/Mouth: Ext aud canals clear, normal light reflex with TMs without erythema, bulging. Good dentition. No erythema, swelling, or exudate on post pharynx. Tonsils not swollen or erythematous. Hearing normal.  Neck: Supple, thyroid normal. No bruits  Respiratory: Respiratory effort normal, BS equal bilaterally without rales, rhonchi, wheezing or stridor.  Cardio: RRR without murmurs, rubs or gallops. Brisk peripheral pulses without edema.  Chest: symmetric, with normal excursions and percussion.  Breasts: Defer to GYN Abdomen: Soft, nontender, no guarding, rebound, hernias, masses, or organomegaly.  Lymphatics: Non tender without lymphadenopathy.  Genitourinary:  Defer to GYN Musculoskeletal: Full ROM all peripheral extremities,5/5 strength, and normal gait.  Skin: Warm, dry without rashes, lesions, ecchymosis. Neuro: Cranial nerves intact, reflexes equal bilaterally. Normal muscle tone, no cerebellar symptoms. Sensation intact.  Psych: Awake and oriented X 3, normal affect, Insight and Judgment appropriate.    Medicare Attestation I have personally reviewed: The patient's medical and social history Their use of alcohol, tobacco or illicit drugs Their current medications and supplements The patient's functional ability including ADLs,fall risks, home safety risks, cognitive, and hearing and visual impairment Diet and physical activities Evidence for depression or mood disorders  The patient's weight, height, BMI, and visual acuity have been recorded in the chart.  I have made referrals, counseling, and provided education to the patient based on review of the above and I have provided the patient with a written personalized care plan for preventive services.      Garnet Sierras, Laqueta Jean, DNP [facility] 12/28/2020  10:05 AM

## 2020-12-29 DIAGNOSIS — Z01419 Encounter for gynecological examination (general) (routine) without abnormal findings: Secondary | ICD-10-CM | POA: Diagnosis not present

## 2020-12-29 DIAGNOSIS — Z6841 Body Mass Index (BMI) 40.0 and over, adult: Secondary | ICD-10-CM | POA: Diagnosis not present

## 2020-12-29 DIAGNOSIS — N952 Postmenopausal atrophic vaginitis: Secondary | ICD-10-CM | POA: Diagnosis not present

## 2020-12-29 LAB — COMPLETE METABOLIC PANEL WITH GFR
AG Ratio: 1.6 (calc) (ref 1.0–2.5)
ALT: 32 U/L — ABNORMAL HIGH (ref 6–29)
AST: 37 U/L — ABNORMAL HIGH (ref 10–35)
Albumin: 4.2 g/dL (ref 3.6–5.1)
Alkaline phosphatase (APISO): 52 U/L (ref 37–153)
BUN: 13 mg/dL (ref 7–25)
CO2: 32 mmol/L (ref 20–32)
Calcium: 9.7 mg/dL (ref 8.6–10.4)
Chloride: 102 mmol/L (ref 98–110)
Creat: 0.65 mg/dL (ref 0.60–0.93)
GFR, Est African American: 103 mL/min/{1.73_m2} (ref >=60)
GFR, Est Non African American: 89 mL/min/{1.73_m2} (ref >=60)
Globulin: 2.6 g/dL (calc) (ref 1.9–3.7)
Glucose, Bld: 87 mg/dL (ref 65–99)
Potassium: 4.4 mmol/L (ref 3.5–5.3)
Sodium: 142 mmol/L (ref 135–146)
Total Bilirubin: 0.7 mg/dL (ref 0.2–1.2)
Total Protein: 6.8 g/dL (ref 6.1–8.1)

## 2020-12-29 LAB — CBC WITH DIFFERENTIAL/PLATELET
Absolute Monocytes: 365 cells/uL (ref 200–950)
Basophils Absolute: 29 cells/uL (ref 0–200)
Basophils Relative: 0.5 %
Eosinophils Absolute: 197 cells/uL (ref 15–500)
Eosinophils Relative: 3.4 %
HCT: 36.7 % (ref 35.0–45.0)
Hemoglobin: 11.7 g/dL (ref 11.7–15.5)
Lymphs Abs: 2146 cells/uL (ref 850–3900)
MCH: 26.2 pg — ABNORMAL LOW (ref 27.0–33.0)
MCHC: 31.9 g/dL — ABNORMAL LOW (ref 32.0–36.0)
MCV: 82.1 fL (ref 80.0–100.0)
MPV: 11.1 fL (ref 7.5–12.5)
Monocytes Relative: 6.3 %
Neutro Abs: 3062 cells/uL (ref 1500–7800)
Neutrophils Relative %: 52.8 %
Platelets: 285 10*3/uL (ref 140–400)
RBC: 4.47 10*6/uL (ref 3.80–5.10)
RDW: 13.8 % (ref 11.0–15.0)
Total Lymphocyte: 37 %
WBC: 5.8 10*3/uL (ref 3.8–10.8)

## 2020-12-29 LAB — LIPID PANEL
Cholesterol: 199 mg/dL (ref <200)
HDL: 62 mg/dL (ref >=50)
LDL Cholesterol (Calc): 113 mg/dL (calc) — ABNORMAL HIGH
Non-HDL Cholesterol (Calc): 137 mg/dL (calc) — ABNORMAL HIGH (ref <130)
Total CHOL/HDL Ratio: 3.2 (calc) (ref <5.0)
Triglycerides: 128 mg/dL (ref <150)

## 2020-12-29 LAB — HEMOGLOBIN A1C
Hgb A1c MFr Bld: 6 % of total Hgb — ABNORMAL HIGH (ref <5.7)
Mean Plasma Glucose: 126 mg/dL
eAG (mmol/L): 7 mmol/L

## 2020-12-31 ENCOUNTER — Other Ambulatory Visit: Payer: Self-pay | Admitting: Obstetrics and Gynecology

## 2021-01-04 ENCOUNTER — Other Ambulatory Visit: Payer: Self-pay | Admitting: Obstetrics and Gynecology

## 2021-01-04 DIAGNOSIS — Z9189 Other specified personal risk factors, not elsewhere classified: Secondary | ICD-10-CM

## 2021-01-06 DIAGNOSIS — G5602 Carpal tunnel syndrome, left upper limb: Secondary | ICD-10-CM | POA: Diagnosis not present

## 2021-01-11 DIAGNOSIS — M25562 Pain in left knee: Secondary | ICD-10-CM | POA: Diagnosis not present

## 2021-02-02 DIAGNOSIS — Z961 Presence of intraocular lens: Secondary | ICD-10-CM | POA: Diagnosis not present

## 2021-02-02 DIAGNOSIS — H35342 Macular cyst, hole, or pseudohole, left eye: Secondary | ICD-10-CM | POA: Diagnosis not present

## 2021-02-02 DIAGNOSIS — H43811 Vitreous degeneration, right eye: Secondary | ICD-10-CM | POA: Diagnosis not present

## 2021-02-02 DIAGNOSIS — H2511 Age-related nuclear cataract, right eye: Secondary | ICD-10-CM | POA: Diagnosis not present

## 2021-03-03 ENCOUNTER — Other Ambulatory Visit: Payer: Self-pay | Admitting: Adult Health Nurse Practitioner

## 2021-03-03 DIAGNOSIS — I10 Essential (primary) hypertension: Secondary | ICD-10-CM

## 2021-04-10 DIAGNOSIS — Z6841 Body Mass Index (BMI) 40.0 and over, adult: Secondary | ICD-10-CM | POA: Diagnosis not present

## 2021-04-10 DIAGNOSIS — M238X2 Other internal derangements of left knee: Secondary | ICD-10-CM | POA: Diagnosis not present

## 2021-04-10 DIAGNOSIS — M2392 Unspecified internal derangement of left knee: Secondary | ICD-10-CM | POA: Diagnosis not present

## 2021-04-10 DIAGNOSIS — M1712 Unilateral primary osteoarthritis, left knee: Secondary | ICD-10-CM | POA: Diagnosis not present

## 2021-04-10 DIAGNOSIS — M25562 Pain in left knee: Secondary | ICD-10-CM | POA: Diagnosis not present

## 2021-04-10 DIAGNOSIS — M2242 Chondromalacia patellae, left knee: Secondary | ICD-10-CM | POA: Diagnosis not present

## 2021-05-01 DIAGNOSIS — G4733 Obstructive sleep apnea (adult) (pediatric): Secondary | ICD-10-CM | POA: Diagnosis not present

## 2021-05-03 ENCOUNTER — Ambulatory Visit
Admission: RE | Admit: 2021-05-03 | Discharge: 2021-05-03 | Disposition: A | Discharge status: Home / Self Care | Payer: Medicare PPO | Source: Ambulatory Visit | Attending: Obstetrics and Gynecology | Admitting: Obstetrics and Gynecology

## 2021-05-03 DIAGNOSIS — Z9189 Other specified personal risk factors, not elsewhere classified: Secondary | ICD-10-CM

## 2021-05-03 DIAGNOSIS — Z1239 Encounter for other screening for malignant neoplasm of breast: Secondary | ICD-10-CM | POA: Diagnosis not present

## 2021-05-03 LAB — HM MAMMOGRAPHY

## 2021-05-03 MED ORDER — GADOBUTROL 1 MMOL/ML IV SOLN
8.0000 mL | Freq: Once | INTRAVENOUS | Status: AC | PRN
  Administered 2021-05-03: 8 mL via INTRAVENOUS

## 2021-06-16 ENCOUNTER — Encounter: Payer: Medicare PPO | Admitting: Adult Health

## 2021-07-04 ENCOUNTER — Encounter: Payer: Medicare PPO | Admitting: Adult Health

## 2021-07-04 DIAGNOSIS — Z0000 Encounter for general adult medical examination without abnormal findings: Secondary | ICD-10-CM

## 2021-07-13 ENCOUNTER — Other Ambulatory Visit: Payer: Self-pay

## 2021-07-13 ENCOUNTER — Encounter: Payer: Self-pay | Admitting: Adult Health

## 2021-07-13 ENCOUNTER — Ambulatory Visit (INDEPENDENT_AMBULATORY_CARE_PROVIDER_SITE_OTHER): Payer: Medicare PPO | Admitting: Adult Health

## 2021-07-13 ENCOUNTER — Other Ambulatory Visit: Payer: Self-pay | Admitting: Adult Health

## 2021-07-13 VITALS — BP 160/88 | HR 73 | Temp 97.3°F | Ht 63.0 in | Wt 233.0 lb

## 2021-07-13 DIAGNOSIS — I700 Atherosclerosis of aorta: Secondary | ICD-10-CM

## 2021-07-13 DIAGNOSIS — K7581 Nonalcoholic steatohepatitis (NASH): Secondary | ICD-10-CM

## 2021-07-13 DIAGNOSIS — Z136 Encounter for screening for cardiovascular disorders: Secondary | ICD-10-CM

## 2021-07-13 DIAGNOSIS — E785 Hyperlipidemia, unspecified: Secondary | ICD-10-CM | POA: Diagnosis not present

## 2021-07-13 DIAGNOSIS — Z0001 Encounter for general adult medical examination with abnormal findings: Secondary | ICD-10-CM

## 2021-07-13 DIAGNOSIS — Z0000 Encounter for general adult medical examination without abnormal findings: Secondary | ICD-10-CM

## 2021-07-13 DIAGNOSIS — Z1329 Encounter for screening for other suspected endocrine disorder: Secondary | ICD-10-CM

## 2021-07-13 DIAGNOSIS — Z9989 Dependence on other enabling machines and devices: Secondary | ICD-10-CM

## 2021-07-13 DIAGNOSIS — K219 Gastro-esophageal reflux disease without esophagitis: Secondary | ICD-10-CM

## 2021-07-13 DIAGNOSIS — R140 Abdominal distension (gaseous): Secondary | ICD-10-CM

## 2021-07-13 DIAGNOSIS — Z6841 Body Mass Index (BMI) 40.0 and over, adult: Secondary | ICD-10-CM

## 2021-07-13 DIAGNOSIS — E559 Vitamin D deficiency, unspecified: Secondary | ICD-10-CM

## 2021-07-13 DIAGNOSIS — I10 Essential (primary) hypertension: Secondary | ICD-10-CM

## 2021-07-13 DIAGNOSIS — R7309 Other abnormal glucose: Secondary | ICD-10-CM

## 2021-07-13 DIAGNOSIS — G4733 Obstructive sleep apnea (adult) (pediatric): Secondary | ICD-10-CM

## 2021-07-13 DIAGNOSIS — R109 Unspecified abdominal pain: Secondary | ICD-10-CM

## 2021-07-13 DIAGNOSIS — Z131 Encounter for screening for diabetes mellitus: Secondary | ICD-10-CM

## 2021-07-13 DIAGNOSIS — K449 Diaphragmatic hernia without obstruction or gangrene: Secondary | ICD-10-CM

## 2021-07-13 DIAGNOSIS — Z79899 Other long term (current) drug therapy: Secondary | ICD-10-CM

## 2021-07-13 DIAGNOSIS — Z1389 Encounter for screening for other disorder: Secondary | ICD-10-CM

## 2021-07-13 MED ORDER — PANTOPRAZOLE SODIUM 40 MG PO TBEC: 40.0000 mg | DELAYED_RELEASE_TABLET | Freq: Every day | ORAL | 1 refills | Status: DC

## 2021-07-13 MED ORDER — OLMESARTAN MEDOXOMIL 40 MG PO TABS: 40.0000 mg | ORAL_TABLET | Freq: Every day | ORAL | 1 refills | Status: DC

## 2021-07-13 NOTE — Progress Notes (Signed)
Complete Physical  Assessment and Plan:  Encounter for Annual Physical Exam with abnormal findings Due annually  Health Maintenance reviewed Healthy lifestyle reviewed and goals set Follow up GYN, requested diabetes eye exam   Atherosclerosis of aorta ([place]) - per CT 06/2020 Control blood pressure, cholesterol, glucose, increase exercise.   Labile hypertension Has been above goal - patient pref fewer meds - will try switch from hyzaar to olmesartan, keep log, 4-6 week follow up Monitor blood pressure at home; call if consistently over 130/80 Reviewed DASH diet.   Reminder to go to the ER if any CP, SOB, nausea, dizziness, severe HA, changes vision/speech, left arm numbness and tingling and jaw pain.  Gastroesophageal reflux disease, esophagitis presence not specified Adding protonix due to ? Breakthrough, referral to new GI per patient preference  Discussed diet, avoiding triggers and other lifestyle changes  NASH (nonalcoholic steatohepatitis) Weight loss advised, low processed carb diet, avoid alcohol/tylenol CMP/GFR  Other abnormal glucose (Prediabetes) Discussed disease and risks Discussed diet/exercise, weight management  Dietary recommendations Physical Activity recommendations -     Hemoglobin A1c  Age-related nuclear cataract, unspecified laterality Followed by ophthalmology   Macular hole, unspecified laterality Followed by ophthalmology  Hyperlipidemia, unspecified hyperlipidemia type Continue statin medication Continue low cholesterol diet and exercise.  Check lipid panel.  -     Lipid panel -     TSH  Morbid obesity (HCC) - BMI 41 Long discussion about weight loss, diet, and exercise Recommended diet heavy in fruits and veggies and low in animal meats, cheeses, and dairy products, processed foods, appropriate calorie intake Add breakfast, high protein, avoid processed carbs Increase water Suggested resistance exercises  Discussed appropriate weight for  height, continue with close monitoring Keep log of food intake and close follow up in 4-6 weeks  Vitamin D deficiency Continue to recommend supplementation for goal of 60-100 Check vitamin D level  OSA on CPAP  Reports 100% compliance, weight loss encouraged  Abdominal distention/bloating/abdominal pain ? Multifactoral - GERD - sent in protonix 40 mg daily to start, lifestyle reviewed, will refer to new GI provider per patient preference Keep food log Some pelvic sx, persistent distention/bloating not correlating with food intake, does have ovaries, body habitus limits manual exam, will proceed with [place] of abd/pelvis - close follow up in 1 month or sooner if needed   Orders Placed This Encounter  Procedures   US Pelvic Complete With Transvaginal   US Abdomen Complete   CBC with Differential/Platelet   COMPLETE METABOLIC PANEL WITH GFR   Magnesium   Lipid panel   TSH   Hemoglobin A1c   VITAMIN D 25 Hydroxy (Vit-D Deficiency, Fractures)   Microalbumin / creatinine urine ratio   Urinalysis, Routine w reflex microscopic   EKG 12-Lead    Discussed med's effects and SE's. Screening labs and tests as requested with regular follow-up as recommended. Over 40 minutes of exam, counseling, chart review, and complex, high level critical decision making was performed this visit.   Future Appointments  Date Time Provider [place]  07/27/2021  8:00 AM [facility] [place] [facility]  07/27/2021 10:15 AM [facility] [place] [facility]  08/18/2021 11:00 AM Liane Comber, NP [facility] None  08/22/2021  7:30 AM Debbora Presto, NP [facility] None  01/15/2022 10:30 AM Liane Comber, NP [facility] None  07/13/2022  9:00 AM Liane Comber, NP [facility] None     HPI  75 y.o. female  presents for a complete physical and follow up for has Class  3 severe obesity due to excess calories with serious comorbidity and body mass index (BMI) of 40.0 to 44.9 in adult [facility]); Essential hypertension;  Hyperlipemia; Hiatal hernia with GERD; Vitamin D deficiency; NASH (nonalcoholic steatohepatitis); Medication management; Macular hole; Cataract; OSA on CPAP; Other abnormal glucose (prediabetes); and Aortic atherosclerosis (HCC) on their problem list.   She is married, 1 daughter, no grandkids but has "granddogs." She is primary caregiver for husband who is blind.  She has GERD consequent of hiatal hernia per GI, but has reported well managed with lifestyle modification and famotidine 40 mg PRN only until recently. She notes having more GI issues for over a month, bloating, distention (persistent, not variable), swelling, intermittent pain (moves around) awakening her in the middle of the night, reports 1 episode urge to move her bowels or urinate and unable but none since. She reports was using warm water with baking soda initially with benefit but stopped working. She denies constipation, metamucil works well. Has tried doing daily daily pantoprazole 20 mg and famotidine with mild benefit but not consistently. Denies NSAID use, ETOH. She has been seeing Dr. Earlean Shawl in the past but she reports he has reduced hours and requesting referral to new provider, asking for referral. Hx of hysterectomy, ovaries spared. Does follow with GYN annually.   OSA on CPAP, endorses 100% compliance and restorative sleep.   BMI is Body mass index is 41.27 kg/m., she has been working on diet and exercise. She is trying to get below 200 lb.  She walks dog daily, 30 min.  She eats minimal meat, pushes vegetables and beans. Admits weakness to sweets. She tends to eat 1 large meal in the afternoon.  3-4 bottles of water daily.  Wt Readings from Last 3 Encounters:  07/13/21 233 lb (105.7 kg)  12/28/20 231 lb (104.8 kg)  09/26/20 225 lb 6.4 oz (102.2 kg)   Aortic atherosclerosis per CT 06/2020  Her blood pressure has not been controlled at home, today their BP is BP: (!) 160/88. She reports very labile. Has been taking  hyzaar, never started amlodipine, would like to minimize pills.  She does workout. She denies chest pain, shortness of breath, dizziness, edema.   She is on cholesterol medication (pitivastatin 4 mg daily, stopped taking since GI sx, hx of myalgias with high dose statins) and denies myalgias. Her cholesterol is not at goal. The cholesterol last visit was:   Lab Results  Component Value Date   CHOL 199 12/28/2020   HDL 62 12/28/2020   LDLCALC 113 (H) 12/28/2020   TRIG 128 12/28/2020   CHOLHDL 3.2 12/28/2020   She has been working on diet and exercise for prediabetes, she is on bASA, she is on ACE/ARB and denies foot ulcerations, increased appetite, nausea, paresthesia of the feet, polydipsia, polyuria, visual disturbances, vomiting and weight loss. Last A1C in the office was:  Lab Results  Component Value Date   HGBA1C 6.0 (H) 12/28/2020   Last GFR: Lab Results  Component Value Date   GFRAA 103 12/28/2020   Patient is on Vitamin D supplement.   Lab Results  Component Value Date   VD25OH 83 09/26/2020     She has fatty liver per [place] 2012 with mild stable LFT elevations monitored  Lab Results  Component Value Date   ALT 32 (H) 12/28/2020   AST 37 (H) 12/28/2020   ALKPHOS 62 05/16/2017   BILITOT 0.7 12/28/2020      Current Medications:  Current Outpatient Medications on File  Prior to Visit  Medication Sig Dispense Refill   aspirin 81 MG tablet Take 81 mg by mouth daily.     Cholecalciferol (VITAMIN D PO) Take 10,000 Int'l Units by mouth daily.      famotidine (PEPCID) 40 MG tablet TAKE 1 TABLET(40 MG) BY MOUTH TWICE DAILY (Patient taking differently: as needed.) 180 tablet 1   MAGNESIUM PO Take 500 mg by mouth.      psyllium (METAMUCIL) 58.6 % powder Take 1 packet by mouth daily.     Pitavastatin Calcium (LIVALO) 4 MG TABS Take 1 tablet (4 mg total) by mouth at bedtime. (Patient not taking: Reported on 07/13/2021) 90 tablet 1   Probiotic Product (PROBIOTIC DAILY PO) Take by  mouth daily. (Patient not taking: Reported on 07/13/2021)     No current facility-administered medications on file prior to visit.   Allergies:  Allergies  Allergen Reactions   Anesthetics, Halogenated     Difficulty with waking up from anesthesia from THR, had aphasia, negative CT   Crestor [Rosuvastatin]    Lopid [Gemfibrozil]    Latex Rash and Swelling    [Rash] redness/swelling/rash   Medical History:  She has Class 3 severe obesity due to excess calories with serious comorbidity and body mass index (BMI) of 40.0 to 44.9 in adult [facility]); Essential hypertension; Hyperlipemia; Hiatal hernia with GERD; Vitamin D deficiency; NASH (nonalcoholic steatohepatitis); Medication management; Macular hole; Cataract; OSA on CPAP; Other abnormal glucose (prediabetes); and Aortic atherosclerosis ([place]) on their problem list. Health Maintenance:   Immunization History  Administered Date(s) Administered   DT (Pediatric) 05/10/2015   PFIZER(Purple Top)SARS-COV-2 Vaccination 01/30/2020, 02/24/2020, 08/20/2020   Td 07/27/2005   Preventative care: Colonoscopy 2017 Dr. Earlean Shawl, normal cologuard 06/2019 negative EGD 2017   Last mammogram: reports 2022 at GYN, - report requested dense breasts had diagnostic left 11/2019 at breast center negative MRI breasts: 02/2018, normal, resume 3D mammograms Last pap smear/pelvic exam: 10/2020- by Harle Battiest - sees annually DEXA: reports in 2021, managed by OBGYN - report requested  Prior vaccinations: TD or Tdap: 2016 Influenza: declines Pneumococcal: declines Prevnar13: declines Shingles/Zostavax: declines COVID 19: 2/2 + booster -   Names of Other Physician/Practitioners you currently use: 1. [facility] here for primary care 2. Dr. Kathrin Penner, Dr. Renford Dills, eye doctor, wears glasses has upcoming 08/2021 3. Dr. Randol Kern, Dr. Rayann Heman, dentist, last visit 2022 - sees q3 month   Patient Care Team: Unk Pinto, MD as PCP -  General (Internal Medicine) Richmond Campbell, MD as Consulting Physician (Gastroenterology) Delila Pereyra, MD as Consulting Physician (Gynecology) Paralee Cancel, MD as Consulting Physician (Orthopedic Surgery)  Surgical History:  She has a past surgical history that includes Abdominal hysterectomy; Eye surgery (Left, 2007); cataract surgery (Left); right arm elbow surgery; index finger surgery (Right); Total hip arthroplasty (Left, 10/06/2013); and right 5th digit finger - cyst removal (Right, 06/04/2019). Family History:  Herfamily history includes Alzheimer's disease in an other family member; Heart disease in her mother; Kidney failure in her mother; Stroke in her father. Social History:  She reports that she has never smoked. She has never used smokeless tobacco. She reports that she does not drink alcohol and does not use drugs.  Review of Systems: Review of Systems  Constitutional:  Negative for malaise/fatigue and weight loss.  HENT:  Negative for hearing loss and tinnitus.   Eyes:  Negative for blurred vision and double vision.  Respiratory:  Negative for cough, sputum production, shortness of breath and wheezing.  Cardiovascular:  Negative for chest pain, palpitations, orthopnea, claudication, leg swelling and PND.  Gastrointestinal:  Positive for abdominal pain. Negative for blood in stool, constipation, diarrhea, heartburn, melena, nausea and vomiting.       Bloating, distention, belching, gasiness  Genitourinary: Negative.   Musculoskeletal:  Negative for falls, joint pain and myalgias.  Skin:  Negative for rash.  Neurological:  Negative for dizziness, tingling, sensory change, weakness and headaches.  Endo/Heme/Allergies:  Negative for polydipsia.  Psychiatric/Behavioral: Negative.  Negative for depression, memory loss, substance abuse and suicidal ideas. The patient is not nervous/anxious and does not have insomnia.   All other systems reviewed and are negative.  Physical  Exam: Estimated body mass index is 41.27 kg/m as calculated from the following:   Height as of this encounter: 5' 3"  (1.6 m).   Weight as of this encounter: 233 lb (105.7 kg). BP (!) 160/88   Pulse 73   Temp (!) 97.3 F (36.3 C)   Ht 5' 3"  (1.6 m)   Wt 233 lb (105.7 kg)   SpO2 99%   BMI 41.27 kg/m  General Appearance: Well nourished, in no apparent distress.  Eyes: PERRLA, EOMs, conjunctiva no swelling or erythema,  Sinuses: No Frontal/maxillary tenderness  ENT/Mouth: Ext aud canals clear, normal light reflex with TMs without erythema, bulging. Good dentition. No erythema, swelling, or exudate on post pharynx. Tonsils not swollen or erythematous. Hearing normal.  Neck: Supple, thyroid normal. No bruits  Respiratory: Respiratory effort normal, BS equal bilaterally without rales, rhonchi, wheezing or stridor.  Cardio: RRR without murmurs, rubs or gallops. Brisk peripheral pulses without edema.  Chest: symmetric, with normal excursions and percussion.  Breasts: Defer to GYN Abdomen: Soft, obese abdomen limits exam, vague tenderness not localizing, no guarding, rebound, no specific palpable hernias, masses, or organomegaly.  Lymphatics: Non tender without lymphadenopathy.  Genitourinary: Defer to GYN Musculoskeletal: Full ROM all peripheral extremities,5/5 strength, and normal gait.  Skin: Warm, dry without rashes, lesions, ecchymosis. Neuro: Cranial nerves intact, reflexes equal bilaterally. Normal muscle tone, no cerebellar symptoms. Sensation intact.  Psych: Awake and oriented X 3, normal affect, Insight and Judgment appropriate.   EKG: NSR  Anita Ribas, NP 12:49 PM [facility]

## 2021-07-13 NOTE — Patient Instructions (Addendum)
Anita Blake , Thank you for taking time to come for your Medicare Wellness Visit. I appreciate your ongoing commitment to your health goals. Please review the following plan we discussed and let me know if I can assist you in the future.   These are the goals we discussed:  Goals      Blood Pressure < 130/80     HEMOGLOBIN A1C < 5.7     LDL CALC < 100     Weight (lb) < 220 lb (99.8 kg)        This is a list of the screening recommended for you and due dates:  Health Maintenance  Topic Date Due   COVID-19 Vaccine (4 - Booster for Pfizer series) 11/19/2020   Zoster (Shingles) Vaccine (1 of 2) 10/13/2021*   Mammogram  10/06/2021   Tetanus Vaccine  05/09/2025   Colon Cancer Screening  08/16/2026   DEXA scan (bone density measurement)  Completed   Hepatitis C Screening: USPSTF Recommendation to screen - Ages 30-79 yo.  Completed   HPV Vaccine  Aged Out   Flu Shot  Discontinued   Pneumonia vaccines  Discontinued  *Topic was postponed. The date shown is not the original due date.     YOU CAN CALL TO MAKE AN ULTRASOUND..  I have put in an order for an ultrasound for you to have You can set them up at your convenience by calling this number [phone] You will likely have the ultrasound at [place] [address]  If you have any issues call our office and we will set this up for you.        Olmesartan Tablets What is this medication? OLMESARTAN (all mi SAR tan) is an angiotensin II receptor blocker, also knownas an ARB. It treats high blood pressure. This medicine may be used for other purposes; ask your health care provider orpharmacist if you have questions. COMMON BRAND NAME(S): Benicar What should I tell my care team before I take this medication? They need to know if you have any of these conditions: if you are on a special diet, such as a low-salt diet kidney or liver disease an unusual or allergic reaction to olmesartan, other medicines, foods, dyes, or  preservatives pregnant or trying to get pregnant breast-feeding How should I use this medication? Take this drug by mouth. Take it as directed on the prescription label at the same time every day. You can take it with or without food. If it upsets your stomach, take it with food. Keep taking it unless your health care providertells you to stop. Talk to your health care provider about the use of this drug in children. While it may be prescribed for children as young as 6 for selected conditions,precautions do apply. Overdosage: If you think you have taken too much of this medicine contact apoison control center or emergency room at once. NOTE: This medicine is only for you. Do not share this medicine with others. What if I miss a dose? If you miss a dose, take it as soon as you can. If it is almost time for yournext dose, take only that dose. Do not take double or extra doses. What may interact with this medication? blood pressure medicines diuretics, especially triamterene, spironolactone or amiloride potassium salts or potassium supplements This list may not describe all possible interactions. Give your health care provider a list of all the medicines, herbs, non-prescription drugs, or dietary supplements you use. Also tell them  if you smoke, drink alcohol, or use illegaldrugs. Some items may interact with your medicine. What should I watch for while using this medication? Visit your doctor or health care professional for regular checks on your progress. Check your blood pressure as directed. Ask your doctor or health care professional what your blood pressure should be and when you should contact him or her. Call your doctor or health care professional if you notice an irregularor fast heart beat. Women should inform their doctor if they wish to become pregnant or think they might be pregnant. There is a potential for serious side effects to an unborn child, particularly in the second or third  trimester. Talk to your health careprofessional or pharmacist for more information. You may get drowsy or dizzy. Do not drive, use machinery, or do anything that needs mental alertness until you know how this drug affects you. Do not stand or sit up quickly, especially if you are an older patient. This reduces the risk of dizzy or fainting spells. Alcohol can make you more drowsy and dizzy.Avoid alcoholic drinks. Avoid salt substitutes unless you are told otherwise by your doctor or healthcare professional. Do not treat yourself for coughs, colds, or pain while you are taking this medicine without asking your doctor or health care professional for advice.Some ingredients may increase your blood pressure. What side effects may I notice from receiving this medication? Side effects that you should report to your doctor or health care professionalas soon as possible: confusion, dizziness, light headedness or fainting spells decreased amount of urine passed diarrhea difficulty breathing or swallowing, hoarseness, or tightening of the throat fast or irregular heart beat, palpitations, or chest pain skin rash, itching swelling of your face, lips, tongue, hands, or feet vomiting weight loss Side effects that usually do not require medical attention (report to yourdoctor or health care professional if they continue or are bothersome): cough decreased sexual function or desire headache nasal congestion or stuffiness nausea sore or cramping muscles This list may not describe all possible side effects. Call your doctor for medical advice about side effects. You may report side effects to FDA at1-800-FDA-1088. Where should I keep my medication? Keep out of the reach of children and pets. Store at room temperature between 20 and 25 degrees C (68 and 77 degrees F).Throw away any unused drug after the expiration date. NOTE: This sheet is a summary. It may not cover all possible information. If you have  questions about this medicine, talk to your doctor, pharmacist, orhealth care provider.  2022 Elsevier/Gold Standard (2019-07-08 13:23:55)

## 2021-07-14 ENCOUNTER — Other Ambulatory Visit: Payer: Self-pay | Admitting: Adult Health

## 2021-07-14 ENCOUNTER — Encounter: Payer: Self-pay | Admitting: Internal Medicine

## 2021-07-14 LAB — LIPID PANEL
Cholesterol: 257 mg/dL — ABNORMAL HIGH (ref <200)
HDL: 66 mg/dL (ref >=50)
LDL Cholesterol (Calc): 167 mg/dL (calc) — ABNORMAL HIGH
Non-HDL Cholesterol (Calc): 191 mg/dL (calc) — ABNORMAL HIGH (ref <130)
Total CHOL/HDL Ratio: 3.9 (calc) (ref <5.0)
Triglycerides: 116 mg/dL (ref <150)

## 2021-07-14 LAB — COMPLETE METABOLIC PANEL WITH GFR
AG Ratio: 1.5 (calc) (ref 1.0–2.5)
ALT: 20 U/L (ref 6–29)
AST: 19 U/L (ref 10–35)
Albumin: 4.1 g/dL (ref 3.6–5.1)
Alkaline phosphatase (APISO): 50 U/L (ref 37–153)
BUN: 17 mg/dL (ref 7–25)
CO2: 31 mmol/L (ref 20–32)
Calcium: 9.7 mg/dL (ref 8.6–10.4)
Chloride: 102 mmol/L (ref 98–110)
Creat: 0.76 mg/dL (ref 0.60–1.00)
Globulin: 2.8 g/dL (calc) (ref 1.9–3.7)
Glucose, Bld: 88 mg/dL (ref 65–99)
Potassium: 4.3 mmol/L (ref 3.5–5.3)
Sodium: 143 mmol/L (ref 135–146)
Total Bilirubin: 0.5 mg/dL (ref 0.2–1.2)
Total Protein: 6.9 g/dL (ref 6.1–8.1)
eGFR: 83 mL/min/{1.73_m2} (ref >=60)

## 2021-07-14 LAB — CBC WITH DIFFERENTIAL/PLATELET
Absolute Monocytes: 455 cells/uL (ref 200–950)
Basophils Absolute: 41 cells/uL (ref 0–200)
Basophils Relative: 0.6 %
Eosinophils Absolute: 269 cells/uL (ref 15–500)
Eosinophils Relative: 3.9 %
HCT: 36.9 % (ref 35.0–45.0)
Hemoglobin: 11.8 g/dL (ref 11.7–15.5)
Lymphs Abs: 2484 cells/uL (ref 850–3900)
MCH: 25.9 pg — ABNORMAL LOW (ref 27.0–33.0)
MCHC: 32 g/dL (ref 32.0–36.0)
MCV: 81.1 fL (ref 80.0–100.0)
MPV: 10.9 fL (ref 7.5–12.5)
Monocytes Relative: 6.6 %
Neutro Abs: 3650 cells/uL (ref 1500–7800)
Neutrophils Relative %: 52.9 %
Platelets: 289 10*3/uL (ref 140–400)
RBC: 4.55 10*6/uL (ref 3.80–5.10)
RDW: 14.2 % (ref 11.0–15.0)
Total Lymphocyte: 36 %
WBC: 6.9 10*3/uL (ref 3.8–10.8)

## 2021-07-14 LAB — HEMOGLOBIN A1C
Hgb A1c MFr Bld: 5.9 % of total Hgb — ABNORMAL HIGH (ref <5.7)
Mean Plasma Glucose: 123 mg/dL
eAG (mmol/L): 6.8 mmol/L

## 2021-07-14 LAB — URINALYSIS, ROUTINE W REFLEX MICROSCOPIC
Bilirubin Urine: NEGATIVE
Glucose, UA: NEGATIVE
Hgb urine dipstick: NEGATIVE
Ketones, ur: NEGATIVE
Leukocytes,Ua: NEGATIVE
Nitrite: NEGATIVE
Protein, ur: NEGATIVE
Specific Gravity, Urine: 1.017 (ref 1.001–1.035)
pH: 6 (ref 5.0–8.0)

## 2021-07-14 LAB — MICROALBUMIN / CREATININE URINE RATIO
Creatinine, Urine: 138 mg/dL (ref 20–275)
Microalb Creat Ratio: 4 mcg/mg creat (ref <30)
Microalb, Ur: 0.6 mg/dL

## 2021-07-14 LAB — MAGNESIUM: Magnesium: 1.8 mg/dL (ref 1.5–2.5)

## 2021-07-14 LAB — TSH: TSH: 2.29 mIU/L (ref 0.40–4.50)

## 2021-07-14 MED ORDER — LIVALO 4 MG PO TABS: 1.0000 | ORAL_TABLET | Freq: Every day | ORAL | 1 refills | Status: DC

## 2021-07-20 ENCOUNTER — Telehealth: Payer: Self-pay | Admitting: Adult Health

## 2021-07-20 NOTE — Telephone Encounter (Signed)
Called patient, left vm to call me back, or respond to [facility] Chart message, left message to decrease to 33m once daily of (g) Benicar and notify our office if symptoms persist.

## 2021-07-20 NOTE — Telephone Encounter (Signed)
patient called to advise Benicar (g), w/ severe side effects. Can not tolerate. Patient states attempted to take whole 35m, this takes all her energy, so she tried 1/2 pill twice daily- am/pm, caused severe cramping in her legs, worst she has ever experienced. Can not tolerate. What is your recommendation.Please send MY Chart to patient, she was not able to figure out how to send a message, but would like to receive a message from you, to see if she can figure messaging out.

## 2021-07-21 ENCOUNTER — Other Ambulatory Visit: Payer: Self-pay | Admitting: Adult Health

## 2021-07-21 DIAGNOSIS — I10 Essential (primary) hypertension: Secondary | ICD-10-CM

## 2021-07-21 MED ORDER — LOSARTAN POTASSIUM-HCTZ 100-12.5 MG PO TABS: ORAL_TABLET | ORAL | 1 refills | Status: DC

## 2021-07-27 ENCOUNTER — Other Ambulatory Visit: Payer: Self-pay

## 2021-07-27 ENCOUNTER — Ambulatory Visit
Admission: RE | Admit: 2021-07-27 | Discharge: 2021-07-27 | Disposition: A | Discharge status: Home / Self Care | Payer: Medicare PPO | Source: Ambulatory Visit | Attending: Adult Health | Admitting: Adult Health

## 2021-07-27 ENCOUNTER — Other Ambulatory Visit: Payer: Medicare PPO

## 2021-07-27 DIAGNOSIS — I10 Essential (primary) hypertension: Secondary | ICD-10-CM | POA: Diagnosis not present

## 2021-07-27 DIAGNOSIS — K828 Other specified diseases of gallbladder: Secondary | ICD-10-CM | POA: Diagnosis not present

## 2021-07-27 DIAGNOSIS — R140 Abdominal distension (gaseous): Secondary | ICD-10-CM

## 2021-07-27 DIAGNOSIS — R109 Unspecified abdominal pain: Secondary | ICD-10-CM

## 2021-07-27 DIAGNOSIS — R102 Pelvic and perineal pain: Secondary | ICD-10-CM | POA: Diagnosis not present

## 2021-07-27 DIAGNOSIS — E7800 Pure hypercholesterolemia, unspecified: Secondary | ICD-10-CM | POA: Diagnosis not present

## 2021-07-27 DIAGNOSIS — Z90711 Acquired absence of uterus with remaining cervical stump: Secondary | ICD-10-CM | POA: Diagnosis not present

## 2021-08-01 ENCOUNTER — Encounter: Payer: Medicare PPO | Admitting: Nurse Practitioner

## 2021-08-02 DIAGNOSIS — M1712 Unilateral primary osteoarthritis, left knee: Secondary | ICD-10-CM | POA: Diagnosis not present

## 2021-08-16 DIAGNOSIS — R1084 Generalized abdominal pain: Secondary | ICD-10-CM | POA: Diagnosis not present

## 2021-08-17 NOTE — Progress Notes (Signed)
Assessment and Plan:  Anita Blake was seen today for follow-up.  Diagnoses and all orders for this visit:  Essential hypertension Titrate up on amlodipine to goal; follow up after up to 5 mg/day with progress; will try higher dose if needed Monitor blood pressure at home; call if consistently over 130/80 Continue DASH diet.   Reminder to go to the ER if any CP, SOB, nausea, dizziness, severe HA, changes vision/speech, left arm numbness and tingling and jaw pain. -     amLODipine (NORVASC) 2.5 MG tablet; Take 1 tab daily for 1 week; if blood pressure remains above 130/80 increase to 2 tabs daily and follow up.  Class 3 severe obesity due to excess calories with serious comorbidity and body mass index (BMI) of 40.0 to 44.9 in adult [facility]) Long discussion about weight loss, diet, and exercise Move calories early in AM Stop corn flakes; reviewed effect of processed carb Increase AM protein, don't restrict portions if plant based and not a lot of fat;  Discussed adding variation to diet, also adding resistance exercises, weights, incline training, etc, 2-3 days a week. Suggested personal trainer to start.  Discussed appropriate weight for height and initial goal 222 lb.  Follow up at next visit  Further disposition pending results of labs. Discussed med's effects and SE's.   Over 30 minutes of exam, counseling, chart review, and critical decision making was performed.   Future Appointments  Date Time Provider [place]  08/22/2021  7:30 AM Anita Blake, Amy, NP [facility] None  01/15/2022 10:30 AM Anita Bernheim, NP [facility] None  07/13/2022  9:00 AM Anita Comber, NP [facility] None    ------------------------------------------------------------------------------------------------------------------   HPI BP (!) 150/82   Pulse 62   Temp 97.7 F (36.5 C)   Wt 232 lb (105.2 kg)   SpO2 98%   BMI 41.10 kg/m  75 y.o.female presents for 1 month follow up on htn after med adjustment.   She has  labile htn, last OV 160/88, she was switched from losartan/hctz and amlodipine to olmesartan per her request to attempt to minimize pill burden, had cramping/intolerance, was switched back to losartan 100 mg/HCTZ 12.5 mg, reports was taking 130-140s/70-80s, but reports ran out, started taking 1/2 tab only of leftover amlodipine (1/2 of 2.5 mg) in the last week.   Today their BP is BP: (!) 150/82  She does workout. She denies chest pain, shortness of breath, dizziness.  BMI is Body mass index is 41.1 kg/m., she has been working on diet and exercise. Very active around home and yard, several hours each day. Eats mostly plant based, not hungry in the morning, occasionally will have dry corn/frosted flakes. Typically lunch and dinner, minimal pasta/bread, likes sweet potatoes, fruits veggies. No soda/sweet drinks.  Wt Readings from Last 3 Encounters:  08/18/21 232 lb (105.2 kg)  07/13/21 233 lb (105.7 kg)  12/28/20 231 lb (104.8 kg)      Past Medical History:  Diagnosis Date   Anemia yrs ago   Arthritis    Complication of anesthesia    slow to wake up   GERD (gastroesophageal reflux disease)    HLD (hyperlipidemia)    Hyperlipidemia    Labile hypertension    no meds   Nonalcoholic steatohepatitis (NASH)    Prediabetes    Vitamin D deficiency      Allergies  Allergen Reactions   Anesthetics, Halogenated     Difficulty with waking up from anesthesia from THR, had aphasia, negative CT   Crestor [  Rosuvastatin]    Lopid [Gemfibrozil]    Latex Rash and Swelling    [Rash] redness/swelling/rash    Current Outpatient Medications on File Prior to Visit  Medication Sig   aspirin 81 MG tablet Take 81 mg by mouth daily.   Cholecalciferol (VITAMIN D PO) Take 10,000 Int'l Units by mouth daily.    famotidine (PEPCID) 40 MG tablet TAKE 1 TABLET(40 MG) BY MOUTH TWICE DAILY (Patient taking differently: as needed.)   MAGNESIUM PO Take 500 mg by mouth.    pantoprazole (PROTONIX) 40 MG tablet  TAKE 1 TABLET(40 MG) BY MOUTH DAILY   Pitavastatin Calcium (LIVALO) 4 MG TABS Take 1 tablet (4 mg total) by mouth at bedtime.   No current facility-administered medications on file prior to visit.    ROS: all negative except above.   Physical Exam:  BP (!) 150/82   Pulse 62   Temp 97.7 F (36.5 C)   Wt 232 lb (105.2 kg)   SpO2 98%   BMI 41.10 kg/m   General Appearance: Well nourished, obese elder female in no apparent distress. Eyes: PERRLA, conjunctiva no swelling or erythema ENT/Mouth: mask in place; Hearing normal.  Neck: Supple, thyroid normal.  Respiratory: Respiratory effort normal, BS equal bilaterally without rales, rhonchi, wheezing or stridor.  Cardio: RRR with no MRGs. Brisk peripheral pulses without edema.  Abdomen: Soft, obese abdomen Lymphatics: Non tender without lymphadenopathy.  Musculoskeletal: no obvious deformity; normal gait.  Skin: Warm, dry without rashes, lesions, ecchymosis.  Neuro: Normal muscle tone Psych: Awake and oriented X 3, normal affect, Insight and Judgment appropriate.   Anita Ribas, NP 11:54 AM Anita Blake

## 2021-08-17 NOTE — Progress Notes (Signed)
PATIENT: Cornell Gaber Goehring DOB: 20-Jun-1946  REASON FOR VISIT: follow up HISTORY FROM: patient  Virtual Visit via Telephone Note  I connected with Charlann Lange Kary on 08/22/21 at  7:30 AM EDT by telephone and verified that I am speaking with the correct person using two identifiers.   I discussed the limitations, risks, security and privacy concerns of performing an evaluation and management service by telephone and the availability of in person appointments. I also discussed with the patient that there may be a patient responsible charge related to this service. The patient expressed understanding and agreed to proceed.   History of Present Illness:  08/22/21 ALL: BARBA SOLT is a 75 y.o. female here today for follow up for OSA on CPAP. She continues to do well. She is using CPAP every night for at least 4 hours. She denies concerns with machine or supplies.     08/16/20 ALL: RHAELYN GIRON is a 75 y.o. female here today for follow up for OSA on CPAP. She reports that she is doing well. She admits that she does not take her CPAP machine when traveling. She and her husband both use CPAP and she feels that it is too much to worry with when traveling. She uses CPAP consistently when home. She denies concerns with supplies or machine. She does continue to note benefit of therapy with improved sleep quality and improvement in nocturia when using CPAP.   Compliance report dated 07/16/2020 through 08/14/2020 reveals that she used CPAP 25 of the past 30 days for compliance of 83%.  She used CPAP greater than 4 hours 25 of the past 30 days for compliance of 83%.  Average usage on days used was 8 hours and 11 minutes.  Residual AHI was 1.8 on 5 to 12 cm of water and an EPR of 3.  There was no significant leak noted.   Observations/Objective:  Generalized: Well developed, in no acute distress  Mentation: Alert oriented to time, place, history taking. Follows all commands speech and language  fluent   Assessment and Plan:  75 y.o. year old female  has a past medical history of Anemia (yrs ago), Arthritis, Complication of anesthesia, GERD (gastroesophageal reflux disease), HLD (hyperlipidemia), Hyperlipidemia, Labile hypertension, Nonalcoholic steatohepatitis (NASH), Prediabetes, and Vitamin D deficiency. here with    ICD-10-CM   1. OSA on CPAP  G47.33 For home use only DME continuous positive airway pressure (CPAP)   Z99.89       Dhalia continues to do well on CPAP therapy. Compliance report reveals excellent compliance. She was encouraged to continue using CPAP nightly and greater than 4 hours each night. She will continue healthy lifestyle habits. Supply orders updated. She will return in 1 year.   Orders Placed This Encounter  Procedures   For home use only DME continuous positive airway pressure (CPAP)    Supplies    Order Specific Question:   Length of Need    Answer:   Lifetime    Order Specific Question:   Patient has OSA or probable OSA    Answer:   Yes    Order Specific Question:   Is the patient currently using CPAP in the home    Answer:   Yes    Order Specific Question:   Settings    Answer:   Other see comments    Order Specific Question:   CPAP supplies needed    Answer:   Mask, headgear, cushions, filters, heated tubing and water  chamber     No orders of the defined types were placed in this encounter.    Follow Up Instructions:  I discussed the assessment and treatment plan with the patient. The patient was provided an opportunity to ask questions and all were answered. The patient agreed with the plan and demonstrated an understanding of the instructions.   The patient was advised to call back or seek an in-person evaluation if the symptoms worsen or if the condition fails to improve as anticipated.  I provided 15 minutes of non-face-to-face time during this encounter. Patient located at their place of residence during [place] visit. Provider is  in the office.    Debbora Presto, NP

## 2021-08-18 ENCOUNTER — Ambulatory Visit (INDEPENDENT_AMBULATORY_CARE_PROVIDER_SITE_OTHER): Payer: Medicare PPO | Admitting: Adult Health

## 2021-08-18 ENCOUNTER — Encounter: Payer: Self-pay | Admitting: Adult Health

## 2021-08-18 ENCOUNTER — Other Ambulatory Visit: Payer: Self-pay

## 2021-08-18 VITALS — BP 150/82 | HR 62 | Temp 97.7°F | Wt 232.0 lb

## 2021-08-18 DIAGNOSIS — Z6841 Body Mass Index (BMI) 40.0 and over, adult: Secondary | ICD-10-CM | POA: Diagnosis not present

## 2021-08-18 DIAGNOSIS — I10 Essential (primary) hypertension: Secondary | ICD-10-CM

## 2021-08-18 MED ORDER — AMLODIPINE BESYLATE 2.5 MG PO TABS: ORAL_TABLET | ORAL | 1 refills | Status: DC

## 2021-08-18 NOTE — Patient Instructions (Addendum)
Goals      Blood Pressure < 130/80     HEMOGLOBIN A1C < 5.7     LDL CALC < 100     Weight (lb) < 220 lb (99.8 kg)        Increase amlodipine to full tab (2.5 mg daily) for 1 week - if frequently running above goal (130/80) please increase to 2 tabs (5 mg) for 1 week. Please contact me with your numbers at this point.   Plain bran flakes with fruit, sliced almonds Ground flax seeds Oat milk or almond milk - chobani extra creamy if very good  Avoid processed carbohydrates - corn flakes, etc  Try to move meals earlier in the day  Add weights - suggest a personal trainer   If doing stair climber or elliptical - raise resistance If doing treadmill - do with high incline at fast walking pace     Amlodipine Tablets What is this medication? AMLODIPINE (am LOE di peen) treats high blood pressure and prevents chest pain (angina). It works by relaxing the blood vessels, which helps decrease the amount of work your heart has to do. It belongs to a group of medications called calcium channel blockers. This medicine may be used for other purposes; ask your health care provider or pharmacist if you have questions. COMMON BRAND NAME(S): Norvasc What should I tell my care team before I take this medication? They need to know if you have any of these conditions: Heart disease Liver disease An unusual or allergic reaction to amlodipine, other medications, foods, dyes, or preservatives Pregnant or trying to get pregnant Breast-feeding How should I use this medication? Take this medication by mouth. Take it as directed on the prescription label at the same time every day. You can take it with or without food. If it upsets your stomach, take it with food. Keep taking it unless your care team tells you to stop. Talk to your care team about the use of this medication in children. While it may be prescribed for children as young as 6 for selected conditions, precautions do apply. Overdosage: If you  think you have taken too much of this medicine contact a poison control center or emergency room at once. NOTE: This medicine is only for you. Do not share this medicine with others. What if I miss a dose? If you miss a dose, take it as soon as you can. If it is almost time for your next dose, take only that dose. Do not take double or extra doses. What may interact with this medication? Clarithromycin Cyclosporine Diltiazem Itraconazole Simvastatin Tacrolimus This list may not describe all possible interactions. Give your health care provider a list of all the medicines, herbs, non-prescription drugs, or dietary supplements you use. Also tell them if you smoke, drink alcohol, or use illegal drugs. Some items may interact with your medicine. What should I watch for while using this medication? Visit your health care provider for regular checks on your progress. Check your blood pressure as directed. Ask your health care provider what your blood pressure should be. Also, find out when you should contact him or her. Do not treat yourself for coughs, colds, or pain while you are using this medication without asking your health care provider for advice. Some medications may increase your blood pressure. You may get drowsy or dizzy. Do not drive, use machinery, or do anything that needs mental alertness until you know how this medication affects you. Do not stand up  or sit up quickly, especially if you are an older patient. This reduces the risk of dizzy or fainting spells. Alcohol can make you more drowsy and dizzy. Avoid alcoholic drinks. What side effects may I notice from receiving this medication? Side effects that you should report to your care team as soon as possible: Allergic reactions-skin rash, itching, hives, swelling of the face, lips, tongue, or throat Heart attack-pain or tightness in the chest, shoulders, arms, or jaw, nausea, shortness of breath, cold or clammy skin, feeling faint or  lightheaded Low blood pressure-dizziness, feeling faint or lightheaded, blurry vision Side effects that usually do not require medical attention (report these to your care team if they continue or are bothersome): Facial flushing, redness Heart palpitations-rapid, pounding, or irregular heartbeat Nausea Stomach pain Swelling of the ankles, hands, or feet This list may not describe all possible side effects. Call your doctor for medical advice about side effects. You may report side effects to FDA at 1-800-FDA-1088. Where should I keep my medication? Keep out of the reach of children and pets. Store at room temperature between 20 and 25 degrees C (68 and 77 degrees F). Protect from light and moisture. Keep the container tightly closed. Get rid of any unused medication after the expiration date. To get rid of medications that are no longer needed or have expired: Take the medication to a medication take-back program. Check with your pharmacy or law enforcement to find a location. If you cannot return the medication, check the label or package insert to see if the medication should be thrown out in the garbage or flushed down the toilet. If you are not sure, ask your health care provider. If it is safe to put in the trash, empty the medication out of the container. Mix the medication with cat litter, dirt, coffee grounds, or other unwanted substance. Seal the mixture in a bag or container. Put it in the trash. NOTE: This sheet is a summary. It may not cover all possible information. If you have questions about this medicine, talk to your doctor, pharmacist, or health care provider.  2022 [employer]/Gold Standard (2020-10-29 14:59:47)

## 2021-08-22 ENCOUNTER — Telehealth (INDEPENDENT_AMBULATORY_CARE_PROVIDER_SITE_OTHER): Payer: Medicare PPO | Admitting: Family Medicine

## 2021-08-22 ENCOUNTER — Encounter: Payer: Self-pay | Admitting: Family Medicine

## 2021-08-22 DIAGNOSIS — Z9989 Dependence on other enabling machines and devices: Secondary | ICD-10-CM

## 2021-08-22 DIAGNOSIS — G4733 Obstructive sleep apnea (adult) (pediatric): Secondary | ICD-10-CM | POA: Diagnosis not present

## 2021-09-06 DIAGNOSIS — G4733 Obstructive sleep apnea (adult) (pediatric): Secondary | ICD-10-CM | POA: Diagnosis not present

## 2021-09-19 DIAGNOSIS — H524 Presbyopia: Secondary | ICD-10-CM | POA: Diagnosis not present

## 2021-09-19 DIAGNOSIS — H43811 Vitreous degeneration, right eye: Secondary | ICD-10-CM | POA: Diagnosis not present

## 2021-09-19 DIAGNOSIS — R7303 Prediabetes: Secondary | ICD-10-CM | POA: Diagnosis not present

## 2021-09-19 DIAGNOSIS — H2511 Age-related nuclear cataract, right eye: Secondary | ICD-10-CM | POA: Diagnosis not present

## 2021-09-19 LAB — HM DIABETES EYE EXAM

## 2021-09-21 DIAGNOSIS — K219 Gastro-esophageal reflux disease without esophagitis: Secondary | ICD-10-CM | POA: Diagnosis not present

## 2021-09-21 DIAGNOSIS — Z7982 Long term (current) use of aspirin: Secondary | ICD-10-CM | POA: Diagnosis not present

## 2021-09-21 DIAGNOSIS — G4733 Obstructive sleep apnea (adult) (pediatric): Secondary | ICD-10-CM | POA: Diagnosis not present

## 2021-09-21 DIAGNOSIS — M1990 Unspecified osteoarthritis, unspecified site: Secondary | ICD-10-CM | POA: Diagnosis not present

## 2021-09-21 DIAGNOSIS — E785 Hyperlipidemia, unspecified: Secondary | ICD-10-CM | POA: Diagnosis not present

## 2021-09-21 DIAGNOSIS — I10 Essential (primary) hypertension: Secondary | ICD-10-CM | POA: Diagnosis not present

## 2021-09-21 DIAGNOSIS — I739 Peripheral vascular disease, unspecified: Secondary | ICD-10-CM | POA: Diagnosis not present

## 2021-09-21 DIAGNOSIS — Z6841 Body Mass Index (BMI) 40.0 and over, adult: Secondary | ICD-10-CM | POA: Diagnosis not present

## 2021-09-27 ENCOUNTER — Encounter: Payer: Self-pay | Admitting: Internal Medicine

## 2021-10-03 DIAGNOSIS — M1712 Unilateral primary osteoarthritis, left knee: Secondary | ICD-10-CM | POA: Diagnosis not present

## 2021-11-06 DIAGNOSIS — Z1231 Encounter for screening mammogram for malignant neoplasm of breast: Secondary | ICD-10-CM | POA: Diagnosis not present

## 2021-11-17 DIAGNOSIS — M1712 Unilateral primary osteoarthritis, left knee: Secondary | ICD-10-CM | POA: Diagnosis not present

## 2021-11-28 ENCOUNTER — Other Ambulatory Visit: Payer: Self-pay | Admitting: Adult Health

## 2021-12-29 DIAGNOSIS — M1712 Unilateral primary osteoarthritis, left knee: Secondary | ICD-10-CM | POA: Diagnosis not present

## 2022-01-01 ENCOUNTER — Ambulatory Visit: Payer: Medicare PPO | Admitting: Adult Health Nurse Practitioner

## 2022-01-09 DIAGNOSIS — Z124 Encounter for screening for malignant neoplasm of cervix: Secondary | ICD-10-CM | POA: Diagnosis not present

## 2022-01-09 DIAGNOSIS — Z6841 Body Mass Index (BMI) 40.0 and over, adult: Secondary | ICD-10-CM | POA: Diagnosis not present

## 2022-01-09 DIAGNOSIS — N952 Postmenopausal atrophic vaginitis: Secondary | ICD-10-CM | POA: Diagnosis not present

## 2022-01-09 DIAGNOSIS — Z9189 Other specified personal risk factors, not elsewhere classified: Secondary | ICD-10-CM | POA: Diagnosis not present

## 2022-01-11 NOTE — Progress Notes (Signed)
MEDICARE ANNUAL WELLNESS   Assessment and Plan:  Encounter for Medicare Annual Wellness exam Follow up GYN, requested diabetes eye exam   Hypertension Elevated today but has been stable/at goal at home; Taking Losartan 150m / HCTZ 12.578mMonitor blood pressure at home; call if consistently over 130/80 Will follow up sooner in 3 months Continue DASH diet.   Reminder to go to the ER if any CP, SOB, nausea, dizziness, severe HA, changes vision/speech, left arm numbness and tingling and jaw pain. -     CBC with Differential/Platelet -     COMPLETE METABOLIC PANEL WITH GFR   Abnormal Glucose Continue diet and exercise Focus on weight loss -Check A1c   Hyperlipidemia Continue medications: Pitavastatin 84m48mt bedtime Discussed dietary and exercise modifications Low fat diet -     Lipid panel  Hiatal hernia with GERD Doing well at this time Continue: Famotidine 90m45met discussed Monitor for triggers Avoid food with high acid content Avoid excessive cafeine Increase water intake  NASH (nonalcoholic steatohepatitis) -CMP Discussed dietary and exercise modifications   Age-related nuclear cataract, unspecified laterality Macular hole, unspecified laterality -Follow up with opthalmology Continue to monitor  Class 2 Severe obesity due to excess calories with serious comorbidity and BMI 39.0-39.9 in adult ([facility] Centerscussed dietary and exercise modifications  Vitamin D deficiency Continue supplementation to maintain goal of 70-100 Taking Vitamin D 10,000 IU daily Defer vitamin D level  OSA on CPAP Continue CPAP/BiPAP, using nightly for at least 8 hours  Helping with daytime fatigue Weight loss still advised Discussed mask & tubing hygeine  Aortic atherosclerosis (HCC)[place] 07/05[place]20 Control blood pressure, lipids and glucose Disscused lifestyle modifications, diet & exercise Continue to monitor   Medication Management Continued   Discussed med's  effects and SE's. Screening labs and tests as requested with regular follow-up as recommended. Over 30 minutes of face to face interview, exam, counseling, chart review, and complex, high level critical decision making was performed this visit.   Future Appointments  Date Time Provider [place]  9:00 AM CorbLiane Comber [facility] None  08/28/2022  9:00 AM LomaDebbora Presto [facility] None  01/15/2023 11:00 AM MullMagda Bernheim [facility] None    Plan:   During the course of the visit the patient was educated and counseled about appropriate screening and preventive services including:   Pneumococcal vaccine  Prevnar 13 Influenza vaccine Td vaccine Screening electrocardiogram Bone densitometry screening Colorectal cancer screening Diabetes screening Glaucoma screening Nutrition counseling  Advanced directives: requested   HPI  Anita Blake  presents for a complete physical and follow up for has Class 3 severe obesity due to excess calories with serious comorbidity and body mass index (BMI) of 40.0 to 44.9 in adult ([facility]CC[facility] Hospitalssential hypertension; Hyperlipemia; Hiatal hernia with GERD; Vitamin D deficiency; NASH (nonalcoholic steatohepatitis); Medication management; Macular hole; Cataract; OSA on CPAP; Other abnormal glucose (prediabetes); and Aortic atherosclerosis (HCC) on their problem list.   Reports overall she is doing well, she does not have any health or medication concerns today. She is married, 1 daughter. She is primary caregiver for husband who is blind. Has a dog Materials engineero is a miniHospital doctoras plantar fasciitis intermittently worse when wears shoes without support  She has GERD consequent of hiatal hernia per GI, but feels well managed with lifestyle modification and famotidine 40 mg PRN only.  OSA on CPAP, endorses 100% compliance and restorative sleep.   BMI is Body mass  index is 39.5 kg/m., she has been working on diet and exercise.   She is trying to get below 200 lb. She is down 9 pounds She walks dog daily, 30 min.  She eats minimal meat, pushes vegetables.  3-4 bottles of water daily.  Wt Readings from Last 3 Encounters:  01/15/22 223 lb (101.2 kg)  08/18/21 232 lb (105.2 kg)  07/13/21 233 lb (105.7 kg)   Aortic atherosclerosis per CT 05/2020 and 07/05/20. Her blood pressure has not been controlled at home (has been 120-130s/70-80s), today their BP is BP: 124/78 BP Readings from Last 3 Encounters:  01/15/22 124/78  08/18/21 (!) 150/82  07/13/21 (!) 160/88    She does workout. She denies chest pain, shortness of breath, dizziness, HA, blurry vision, diarrhea/GI sx.   She is on cholesterol medication (pitivastatin 4 mg 1/2 tab daily only due to hx of myalgias with high dose statins) and denies myalgias. Her cholesterol is not at goal. The cholesterol last visit was:   Lab Results  Component Value Date   CHOL 257 (H) 07/13/2021   HDL 66 07/13/2021   LDLCALC 167 (H) 07/13/2021   TRIG 116 07/13/2021   CHOLHDL 3.9 07/13/2021   She has been working on diet and exercise for prediabetes, she is on bASA, she is on ACE/ARB and denies foot ulcerations, increased appetite, nausea, paresthesia of the feet, polydipsia, polyuria, visual disturbances, vomiting and weight loss. Last A1C in the office was:  Lab Results  Component Value Date   HGBA1C 5.9 (H) 07/13/2021   Last GFR: Lab Results  Component Value Date   GFRAA 103 12/28/2020   Patient is on Vitamin D supplement.   Lab Results  Component Value Date   VD25OH 83 09/26/2020     She has fatty liver per [place] 2012 with mild stable LFT elevations monitored ,last value was WNL Lab Results  Component Value Date   ALT 20 07/13/2021   AST 19 07/13/2021   ALKPHOS 62 05/16/2017   BILITOT 0.5 07/13/2021    Current Medications:  Current Outpatient Medications on File Prior to Visit  Medication Sig Dispense Refill   aspirin 81 MG tablet Take 81 mg by mouth daily.      Cholecalciferol (VITAMIN D) 125 MCG (5000 UT) CAPS Take by mouth.     famotidine (PEPCID) 40 MG tablet TAKE 1 TABLET(40 MG) BY MOUTH TWICE DAILY 180 tablet 1   MAGNESIUM PO Take 500 mg by mouth.      pantoprazole (PROTONIX) 40 MG tablet TAKE 1 TABLET(40 MG) BY MOUTH DAILY 90 tablet 3   Pitavastatin Calcium (LIVALO) 4 MG TABS Take 1 tablet (4 mg total) by mouth at bedtime. 90 tablet 1   amLODipine (NORVASC) 2.5 MG tablet Take 1 tab daily for 1 week; if blood pressure remains above 130/80 increase to 2 tabs daily and follow up. (Patient not taking: Reported on 01/15/2022) 90 tablet 1   Cholecalciferol (VITAMIN D PO) Take 10,000 Int'l Units by mouth daily.  (Patient not taking: Reported on 01/15/2022)     No current facility-administered medications on file prior to visit.   Allergies:  Allergies  Allergen Reactions   Anesthetics, Halogenated     Difficulty with waking up from anesthesia from THR, had aphasia, negative CT   Crestor [Rosuvastatin]    Lopid [Gemfibrozil]    Latex Rash and Swelling    [Rash] redness/swelling/rash   Medical History:  She has Class 3 severe obesity due to excess calories with serious  comorbidity and body mass index (BMI) of 40.0 to 44.9 in adult [facility]); Essential hypertension; Hyperlipemia; Hiatal hernia with GERD; Vitamin D deficiency; NASH (nonalcoholic steatohepatitis); Medication management; Macular hole; Cataract; OSA on CPAP; Other abnormal glucose (prediabetes); and Aortic atherosclerosis (HCC) on their problem list.   MEDICARE WELLNESS OBJECTIVES: Physical activity: Current Exercise Habits: Home exercise routine, Type of exercise: walking, Time (Minutes): 30, Frequency (Times/Week): 7, Weekly Exercise (Minutes/Week): 210, Intensity: Mild, Exercise limited by: Other - see comments Cardiac risk factors: Cardiac Risk Factors include: advanced age (>29mn, >>52women);diabetes mellitus;dyslipidemia;hypertension;obesity (BMI >30kg/m2) Depression/mood screen:    [facility] 01/15/2022  Decreased Interest 0  Down, Depressed, Hopeless 0  PHQ - 2 Score 0    ADLs:  In your present state of health, do you have any difficulty performing the following activities: 01/15/2022  Hearing? N  Vision? N  Difficulty concentrating or making decisions? N  Dressing or bathing? N  Doing errands, shopping? N  Some recent data might be hidden      Cognitive Testing  Alert? Yes  Normal Appearance?Yes  Oriented to person? Yes  Place? Yes   Time? Yes  Recall of three objects?  Yes  Can perform simple calculations? Yes  Displays appropriate judgment?Yes  Can read the correct time from a watch face?Yes  EOL planning: Does Patient Have a Medical Advance Directive?: Yes Type of Advance Directive: Healthcare Power of Attorney, Living will Does patient want to make changes to medical advance directive?: No - Patient declined Copy of HGarrisonin Chart?: No - copy requested    Health Maintenance:   Immunization History  Administered Date(s) Administered   DT (Pediatric) 05/10/2015   PFIZER(Purple Top)SARS-COV-2 Vaccination 01/30/2020, 02/24/2020, 08/20/2020   Td 07/27/2005   Preventative care: cologuard 06/2019 negative Colonoscopy 2017 Dr. MEarlean Shawl normal EGD 2017   Last mammogram: 11/06/21 Yearly - pt reported normal MRI breasts: 02/2018, normal, resume 3D mammograms Last pap smear/pelvic exam: [facility]21 [facility], Dr. TGertie FeyDEXA: 09/2019 managed by OBGYN  CT head 09/2013 MRI head: 12/2017 [place] 2012 [place] 2011 Stress test 05/2016 CXR 05/2020 Sleep study 02/2019  Prior vaccinations: TD or Tdap: 2016 Influenza: declines Pneumococcal: declines Prevnar13: declines Shingles/Zostavax: declines COVID completed 02/2020  Names of Other Physician/Practitioners you currently use: 1. [facility] here for primary care 2. Dr. TSatira Sark2022- no diabetic  retinopathy, sent for report 3. Dr. MRandol Kern Dr. LRayann Heman dentist, last visit 2022 - sees q3 month   Patient Care Team: MUnk Pinto MD as PCP - General (Internal Medicine) MRichmond Campbell MD as Consulting Physician (Gastroenterology) MDelila Pereyra MD as Consulting Physician (Gynecology) OParalee Cancel MD as Consulting Physician (Orthopedic Surgery)  Surgical History:  She has a past surgical history that includes Abdominal hysterectomy; Eye surgery (Left, 2007); cataract surgery (Left); right arm elbow surgery; index finger surgery (Right); Total hip arthroplasty (Left, 10/06/2013); and right 5th digit finger - cyst removal (Right, 06/04/2019). Family History:  Herfamily history includes Alzheimer's disease in an other family member; Heart disease in her mother; Kidney failure in her mother; Stroke in her father. Social History:  She reports that she has never smoked. She has never used smokeless tobacco. She reports that she does not drink alcohol and does not use drugs.     Review of Systems: Review of Systems  Constitutional:  Negative for malaise/fatigue and weight loss.  HENT:  Negative for hearing loss and tinnitus.   Eyes:  Negative  for blurred vision and double vision.  Respiratory:  Negative for cough, sputum production, shortness of breath and wheezing.   Cardiovascular:  Negative for chest pain, palpitations, orthopnea, claudication, leg swelling and PND.  Gastrointestinal:  Negative for abdominal pain, blood in stool, constipation, diarrhea, heartburn, melena, nausea and vomiting.  Genitourinary: Negative.   Musculoskeletal:  Negative for falls, joint pain and myalgias.  Skin:  Negative for rash.  Neurological:  Negative for dizziness, tingling, sensory change, weakness and headaches.  Endo/Heme/Allergies:  Negative for polydipsia.  Psychiatric/Behavioral: Negative.  Negative for depression, memory loss, substance abuse and suicidal ideas. The patient is not  nervous/anxious and does not have insomnia.   All other systems reviewed and are negative.  Physical Exam: Estimated body mass index is 39.5 kg/m as calculated from the following:   Height as of 07/13/21: 5' 3"  (1.6 m).   Weight as of this encounter: 223 lb (101.2 kg). BP 124/78    Pulse 66    Temp 97.9 F (36.6 C)    Wt 223 lb (101.2 kg)    SpO2 99%    BMI 39.50 kg/m  General Appearance: Well nourished, in no apparent distress.  Eyes: PERRLA, EOMs, conjunctiva no swelling or erythema,  Sinuses: No Frontal/maxillary tenderness  ENT/Mouth: Ext aud canals clear, normal light reflex with TMs without erythema, bulging. Good dentition. No erythema, swelling, or exudate on post pharynx. Tonsils not swollen or erythematous. Hearing normal.  Neck: Supple, thyroid normal. No bruits  Respiratory: Respiratory effort normal, BS equal bilaterally without rales, rhonchi, wheezing or stridor.  Cardio: RRR without murmurs, rubs or gallops. Brisk peripheral pulses without edema.  Chest: symmetric, with normal excursions and percussion.  Breasts: Defer to GYN Abdomen: Soft, nontender, no guarding, rebound, hernias, masses, or organomegaly.  Lymphatics: Non tender without lymphadenopathy.  Genitourinary: Defer to GYN Musculoskeletal: Full ROM all peripheral extremities,5/5 strength, and normal gait.  Skin: Warm, dry without rashes, lesions, ecchymosis. Neuro: Cranial nerves intact, reflexes equal bilaterally. Normal muscle tone, no cerebellar symptoms. Sensation intact.  Psych: Awake and oriented X 3, normal affect, Insight and Judgment appropriate.    Medicare Attestation I have personally reviewed: The patient's medical and social history Their use of alcohol, tobacco or illicit drugs Their current medications and supplements The patient's functional ability including ADLs,fall risks, home safety risks, cognitive, and hearing and visual impairment Diet and physical activities Evidence for  depression or mood disorders  The patient's weight, height, BMI, and visual acuity have been recorded in the chart.  I have made referrals, counseling, and provided education to the patient based on review of the above and I have provided the patient with a written personalized care plan for preventive services.     Magda Bernheim ANP-C  [name]  01/15/2022

## 2022-01-15 ENCOUNTER — Ambulatory Visit (INDEPENDENT_AMBULATORY_CARE_PROVIDER_SITE_OTHER): Payer: Medicare PPO | Admitting: Nurse Practitioner

## 2022-01-15 ENCOUNTER — Other Ambulatory Visit: Payer: Self-pay

## 2022-01-15 ENCOUNTER — Encounter: Payer: Self-pay | Admitting: Nurse Practitioner

## 2022-01-15 VITALS — BP 124/78 | HR 66 | Temp 97.9°F | Wt 223.0 lb

## 2022-01-15 DIAGNOSIS — K7581 Nonalcoholic steatohepatitis (NASH): Secondary | ICD-10-CM

## 2022-01-15 DIAGNOSIS — I700 Atherosclerosis of aorta: Secondary | ICD-10-CM | POA: Diagnosis not present

## 2022-01-15 DIAGNOSIS — I10 Essential (primary) hypertension: Secondary | ICD-10-CM

## 2022-01-15 DIAGNOSIS — E785 Hyperlipidemia, unspecified: Secondary | ICD-10-CM | POA: Diagnosis not present

## 2022-01-15 DIAGNOSIS — Z0001 Encounter for general adult medical examination with abnormal findings: Secondary | ICD-10-CM | POA: Diagnosis not present

## 2022-01-15 DIAGNOSIS — H2510 Age-related nuclear cataract, unspecified eye: Secondary | ICD-10-CM | POA: Diagnosis not present

## 2022-01-15 DIAGNOSIS — Z79899 Other long term (current) drug therapy: Secondary | ICD-10-CM | POA: Diagnosis not present

## 2022-01-15 DIAGNOSIS — K219 Gastro-esophageal reflux disease without esophagitis: Secondary | ICD-10-CM

## 2022-01-15 DIAGNOSIS — Z9989 Dependence on other enabling machines and devices: Secondary | ICD-10-CM

## 2022-01-15 DIAGNOSIS — R6889 Other general symptoms and signs: Secondary | ICD-10-CM

## 2022-01-15 DIAGNOSIS — G4733 Obstructive sleep apnea (adult) (pediatric): Secondary | ICD-10-CM

## 2022-01-15 DIAGNOSIS — E559 Vitamin D deficiency, unspecified: Secondary | ICD-10-CM

## 2022-01-15 DIAGNOSIS — K449 Diaphragmatic hernia without obstruction or gangrene: Secondary | ICD-10-CM | POA: Diagnosis not present

## 2022-01-15 DIAGNOSIS — E66812 Obesity, class 2: Secondary | ICD-10-CM

## 2022-01-15 DIAGNOSIS — Z6839 Body mass index (BMI) 39.0-39.9, adult: Secondary | ICD-10-CM

## 2022-01-15 DIAGNOSIS — H35349 Macular cyst, hole, or pseudohole, unspecified eye: Secondary | ICD-10-CM

## 2022-01-15 DIAGNOSIS — R7309 Other abnormal glucose: Secondary | ICD-10-CM | POA: Diagnosis not present

## 2022-01-15 DIAGNOSIS — Z0000 Encounter for general adult medical examination without abnormal findings: Secondary | ICD-10-CM

## 2022-01-15 NOTE — Patient Instructions (Signed)

## 2022-01-16 ENCOUNTER — Encounter: Payer: Self-pay | Admitting: Internal Medicine

## 2022-01-16 LAB — COMPLETE METABOLIC PANEL WITH GFR
AG Ratio: 1.7 (calc) (ref 1.0–2.5)
ALT: 23 U/L (ref 6–29)
AST: 21 U/L (ref 10–35)
Albumin: 4.4 g/dL (ref 3.6–5.1)
Alkaline phosphatase (APISO): 56 U/L (ref 37–153)
BUN: 13 mg/dL (ref 7–25)
CO2: 33 mmol/L — ABNORMAL HIGH (ref 20–32)
Calcium: 9.6 mg/dL (ref 8.6–10.4)
Chloride: 103 mmol/L (ref 98–110)
Creat: 0.68 mg/dL (ref 0.60–1.00)
Globulin: 2.6 g/dL (calc) (ref 1.9–3.7)
Glucose, Bld: 84 mg/dL (ref 65–99)
Potassium: 4.8 mmol/L (ref 3.5–5.3)
Sodium: 142 mmol/L (ref 135–146)
Total Bilirubin: 0.8 mg/dL (ref 0.2–1.2)
Total Protein: 7 g/dL (ref 6.1–8.1)
eGFR: 92 mL/min/{1.73_m2} (ref >=60)

## 2022-01-16 LAB — CBC WITH DIFFERENTIAL/PLATELET
Absolute Monocytes: 504 cells/uL (ref 200–950)
Basophils Absolute: 43 cells/uL (ref 0–200)
Basophils Relative: 0.6 %
Eosinophils Absolute: 227 cells/uL (ref 15–500)
Eosinophils Relative: 3.2 %
HCT: 38.4 % (ref 35.0–45.0)
Hemoglobin: 12.2 g/dL (ref 11.7–15.5)
Lymphs Abs: 2954 cells/uL (ref 850–3900)
MCH: 25.7 pg — ABNORMAL LOW (ref 27.0–33.0)
MCHC: 31.8 g/dL — ABNORMAL LOW (ref 32.0–36.0)
MCV: 80.8 fL (ref 80.0–100.0)
MPV: 11 fL (ref 7.5–12.5)
Monocytes Relative: 7.1 %
Neutro Abs: 3373 cells/uL (ref 1500–7800)
Neutrophils Relative %: 47.5 %
Platelets: 294 10*3/uL (ref 140–400)
RBC: 4.75 10*6/uL (ref 3.80–5.10)
RDW: 14.2 % (ref 11.0–15.0)
Total Lymphocyte: 41.6 %
WBC: 7.1 10*3/uL (ref 3.8–10.8)

## 2022-01-16 LAB — LIPID PANEL
Cholesterol: 175 mg/dL (ref <200)
HDL: 62 mg/dL (ref >=50)
LDL Cholesterol (Calc): 93 mg/dL (calc)
Non-HDL Cholesterol (Calc): 113 mg/dL (calc) (ref <130)
Total CHOL/HDL Ratio: 2.8 (calc) (ref <5.0)
Triglycerides: 102 mg/dL (ref <150)

## 2022-01-16 LAB — HEMOGLOBIN A1C
Hgb A1c MFr Bld: 5.9 % of total Hgb — ABNORMAL HIGH (ref <5.7)
Mean Plasma Glucose: 123 mg/dL
eAG (mmol/L): 6.8 mmol/L

## 2022-04-16 DIAGNOSIS — H5310 Unspecified subjective visual disturbances: Secondary | ICD-10-CM | POA: Diagnosis not present

## 2022-04-16 DIAGNOSIS — H35372 Puckering of macula, left eye: Secondary | ICD-10-CM | POA: Diagnosis not present

## 2022-07-04 ENCOUNTER — Encounter: Payer: Medicare PPO | Admitting: Adult Health

## 2022-07-13 ENCOUNTER — Encounter: Payer: Self-pay | Admitting: Nurse Practitioner

## 2022-07-13 ENCOUNTER — Ambulatory Visit (INDEPENDENT_AMBULATORY_CARE_PROVIDER_SITE_OTHER): Payer: Medicare PPO | Admitting: Nurse Practitioner

## 2022-07-13 VITALS — BP 122/74 | HR 52 | Temp 97.5°F | Ht 63.0 in | Wt 222.0 lb

## 2022-07-13 DIAGNOSIS — I10 Essential (primary) hypertension: Secondary | ICD-10-CM

## 2022-07-13 DIAGNOSIS — H2510 Age-related nuclear cataract, unspecified eye: Secondary | ICD-10-CM

## 2022-07-13 DIAGNOSIS — E559 Vitamin D deficiency, unspecified: Secondary | ICD-10-CM | POA: Diagnosis not present

## 2022-07-13 DIAGNOSIS — Z0001 Encounter for general adult medical examination with abnormal findings: Secondary | ICD-10-CM

## 2022-07-13 DIAGNOSIS — E66813 Obesity, class 3: Secondary | ICD-10-CM

## 2022-07-13 DIAGNOSIS — I700 Atherosclerosis of aorta: Secondary | ICD-10-CM | POA: Diagnosis not present

## 2022-07-13 DIAGNOSIS — E785 Hyperlipidemia, unspecified: Secondary | ICD-10-CM

## 2022-07-13 DIAGNOSIS — H35349 Macular cyst, hole, or pseudohole, unspecified eye: Secondary | ICD-10-CM

## 2022-07-13 DIAGNOSIS — R7309 Other abnormal glucose: Secondary | ICD-10-CM

## 2022-07-13 DIAGNOSIS — Z79899 Other long term (current) drug therapy: Secondary | ICD-10-CM | POA: Diagnosis not present

## 2022-07-13 DIAGNOSIS — Z0000 Encounter for general adult medical examination without abnormal findings: Secondary | ICD-10-CM

## 2022-07-13 DIAGNOSIS — K7581 Nonalcoholic steatohepatitis (NASH): Secondary | ICD-10-CM

## 2022-07-13 DIAGNOSIS — Z6841 Body Mass Index (BMI) 40.0 and over, adult: Secondary | ICD-10-CM | POA: Diagnosis not present

## 2022-07-13 DIAGNOSIS — K219 Gastro-esophageal reflux disease without esophagitis: Secondary | ICD-10-CM

## 2022-07-13 DIAGNOSIS — G4733 Obstructive sleep apnea (adult) (pediatric): Secondary | ICD-10-CM

## 2022-07-13 DIAGNOSIS — Z136 Encounter for screening for cardiovascular disorders: Secondary | ICD-10-CM | POA: Diagnosis not present

## 2022-07-13 NOTE — Patient Instructions (Signed)
HAPPY BIRTHDAY TO YOU!!!   Eating Plan for Brain Health A healthy diet is an important part of overall wellness, and it may help to improve brain function. Eating a healthy diet can lower the risk for Alzheimer's disease and dementia. It also may slow the progression of those diseases. Depending on your overall health and any conditions you have, you may need to follow certain dietary guidelines. Work with your health care provider or nutrition specialist (dietitian) to create an eating plan that is right for you. What are tips for following this plan? Reading food labels Check the Nutrition Facts on food labels for the Daily Value (DV) percentages of nutrients in one serving of food. DVs are based on the recommended amounts of nutrients to eat, or not to exceed, each day. Aim for a DV of 5% or less per serving for: Saturated fats. Trans fats. Cholesterol. Salt (sodium). Choose whole grains instead of processed grains such as white flour, white bread, and white rice. "Whole grain" or "whole wheat" should be among the first items in the ingredients list. Shopping Before you go grocery shopping, plan your meals and make a shopping list. Look for lean meats, fish, low-fat dairy products, fruits and vegetables, and whole grains. Avoid buying processed or prepared foods. These are higher in added sugar, fat, and sodium. Cooking Use healthy oils, such as olive oil, instead of butter or margarine. Avoid frying foods. Healthier ways of cooking include roasting, baking, poaching, and steaming. Many healthy foods can be prepared without cooking, such as canned tuna, nuts, beans, vegetables, and fruits. Meal planning Plan to eat one or more servings of each of these foods every day: Green leafy vegetables. Nuts. Whole grains. Plan to eat berries, beans, fish, and poultry two or more times every week. Avoid red meats, butter, cheese, sweets, and fried foods. Eat 6 smaller meals throughout the day  rather than 3 full meals. General tips If you have difficulty chewing or swallowing: Choose foods that are tender, soft, and moist. Avoid foods that are sticky, hard, dry, chewy, or crunchy. Cut food into pieces that are smaller than your thumbnail. If you are underweight: Add extra protein or calories to meals by adding cream, nut butters, or protein powder to foods and drinks. Drink nutritional supplement shakes as told by your health care provider or dietitian. Drink enough fluid to keep your urine pale yellow. If you drink alcohol: Limit how much you use to: 0-1 drink a day for women who are not pregnant. 0-2 drinks a day for men. Be aware of how much alcohol is in your drink. In the [place] one drink equals one 12 oz bottle of beer (355 mL), one 5 oz glass of wine (148 mL), or one 1 oz glass of hard liquor (44 mL). Take daily vitamin and mineral supplements as told by your health care provider or dietitian. Follow daily calorie and nutrient intake goals as told by your dietitian. What foods are recommended?  Foods that are high in omega-3s (omega-3 fatty acids). Omega-3s are often found in coldwater fish. They are also found in ground flaxseed, walnuts, edamame, and seaweed. It is recommended that adults eat at least 8 oz of fish or other seafood every week. Wild salmon, albacore, tuna, sardines, and farmed trout are among the best sources for omega-3s. Bake or grill fish instead of frying it to avoid unhealthy fats. Foods that contain vitamins C, D, and E. These include: Avocados. Beans. Nuts and seeds. Green leafy vegetables,  like spinach and kale. Cruciferous vegetables, like broccoli or Brussels sprouts. Cherries and berries, especially blackberries and blueberries. Berries have antioxidants and nutrients that support memory function. Whole grains, such as oatmeal, whole-grain cereal, whole-grain bread, brown rice, and barley. Herbs and spices. Cooking with certain herbs and  spices, such as turmeric, can help you absorb vitamins. Foods in the [place] diet, the DASH (Dietary Approaches to Stop Hypertension) diet, or a combination of foods in both plans. These diets recommend that you: Eat green leafy vegetables, nuts, and whole grains every day. Drink one glass of wine a day if appropriate. Eat berries, beans, fish, and poultry two or more times every week. Limit red meats, butter, cheese, sweets, fried foods, and fast food to once a week or less. Healthy breakfast foods, such as whole-grain cereal, low-fat yogurt, berries or vegetables, and nuts. Eating breakfast every day can boost brain function and concentration for people of all ages. What foods are not recommended? Foods that are high in trans fats, including: Fried foods. Snack foods such as potato chips. Pastries like cookies and donuts, especially those that come prepackaged. Foods that are high in saturated fats, including: Processed, precooked, or cured meat, such as sausages or meat loaves. Red meat. Certain dairy products like butter, margarine, and some cheeses. Processed grains, such as white bread. Sweets and fast food. Limit these types of food to once a week or less. Summary Eating a nutritious diet can support brain health as well as overall wellness. Choose foods that are high in omega-3 fatty acids, vitamins, and whole grains. Avoid foods that contain trans fats and saturated fats. Limit sweets and fast food. This information is not intended to replace advice given to you by your health care provider. Make sure you discuss any questions you have with your health care provider. Document Revised: 03/02/2020 Document Reviewed: 03/02/2020 Elsevier Patient Education  [employer].

## 2022-07-13 NOTE — Progress Notes (Signed)
Complete Physical  Assessment and Plan:  Encounter for Annual Physical Exam with abnormal findings Due annually  Health Maintenance reviewed Healthy lifestyle reviewed and goals set Follow up GYN, requested diabetes eye exam   Atherosclerosis of aorta ([place]) - per CT 06/2020 Control blood pressure, cholesterol, glucose, increase exercise.   Labile hypertension Has been above goal - patient pref fewer meds - will try switch from hyzaar to olmesartan, keep log, 4-6 week follow up Monitor blood pressure at home; call if consistently over 130/80 Reviewed DASH diet.   Reminder to go to the ER if any CP, SOB, nausea, dizziness, severe HA, changes vision/speech, left arm numbness and tingling and jaw pain.  Gastroesophageal reflux disease, esophagitis presence not specified Adding protonix due to ? Breakthrough, referral to new GI per patient preference  Discussed diet, avoiding triggers and other lifestyle changes  NASH (nonalcoholic steatohepatitis) Weight loss advised, low processed carb diet, avoid alcohol/tylenol CMP/GFR  Other abnormal glucose (Prediabetes) Discussed disease and risks Discussed diet/exercise, weight management  Dietary recommendations Physical Activity recommendations -     Hemoglobin A1c  Age-related nuclear cataract, unspecified laterality Followed by ophthalmology   Macular hole, unspecified laterality Followed by ophthalmology  Hyperlipidemia, unspecified hyperlipidemia type Continue statin medication Continue low cholesterol diet and exercise.  Check lipid panel.  -     Lipid panel -     TSH  Morbid obesity (Anita Blake) - BMI 41 Long discussion about weight loss, diet, and exercise Recommended diet heavy in fruits and veggies and low in animal meats, cheeses, and dairy products, processed foods, appropriate calorie intake Add breakfast, high protein, avoid processed carbs Increase water Suggested resistance exercises  Discussed appropriate weight for  height, continue with close monitoring Keep log of food intake and close follow up in 4-6 weeks  Vitamin D deficiency Continue to recommend supplementation for goal of 60-100 Check vitamin D level  OSA on CPAP  Reports 100% compliance, weight loss encouraged    No orders of the defined types were placed in this encounter.   Discussed med's effects and SE's. Screening labs and tests as requested with regular follow-up as recommended. Over 40 minutes of exam, counseling, chart review, and complex, high level critical decision making was performed this visit.   Future Appointments  Date Time Provider [place]  08/28/2022  9:00 AM Debbora Presto, NP [facility] None  01/15/2023 11:00 AM Alycia Rossetti, NP [facility] None  07/15/2023 10:00 AM Darrol Jump, NP [facility] None     HPI  Anita Blake  presents for a complete physical and follow up for has Class 3 severe obesity due to excess calories with serious comorbidity and body mass index (BMI) of 40.0 to 44.9 in adult Anita Blake); Essential hypertension; Hyperlipemia; Hiatal hernia with GERD; Vitamin D deficiency; NASH (nonalcoholic steatohepatitis); Medication management; Macular hole; Cataract; OSA on CPAP; Other abnormal glucose (prediabetes); and Aortic atherosclerosis (Anita Blake) on their problem list.   She is married, 1 daughter, no grandkids but has "granddogs." She is primary caregiver for husband who is blind.  She has GERD consequent of hiatal hernia per GI, but has reported well managed with lifestyle modification and famotidine 40 mg PRN only until recently. She notes having more GI issues for over a month, bloating, distention (persistent, not variable), swelling, intermittent pain (moves around) awakening her in the middle of the night, reports 1 episode urge to move her bowels or urinate and unable but none since. She reports was using warm water with baking soda  initially with benefit but stopped working. She denies  constipation, metamucil works well. Has tried doing daily daily pantoprazole 20 mg and famotidine with mild benefit but not consistently. Denies NSAID use, ETOH. She has been seeing Dr. Earlean Shawl in the past but she reports he has reduced hours and requesting referral to new provider, asking for referral. Hx of hysterectomy, ovaries spared. Does follow with GYN annually.   OSA on CPAP, endorses 100% compliance and restorative sleep.   BMI is Body mass index is 39.33 kg/m., she has been working on diet and exercise. She is trying to get below 200 lb.  She walks dog daily, 30 min.  She eats minimal meat, pushes vegetables and beans. Admits weakness to sweets. She tends to eat 1 large meal in the afternoon.  3-4 bottles of water daily.  Wt Readings from Last 3 Encounters:  07/13/22 222 lb (100.7 kg)  01/15/22 223 lb (101.2 kg)  08/18/21 232 lb (105.2 kg)   Aortic atherosclerosis per CT 06/2020  Her blood pressure has not been controlled at home, today their BP is BP: 122/Anita. She reports very labile. Has been taking hyzaar, never started amlodipine, would like to minimize pills.  She does workout. She denies chest pain, shortness of breath, dizziness, edema.   She is on cholesterol medication (pitivastatin 4 mg daily, stopped taking since GI sx, hx of myalgias with high dose statins) and denies myalgias. Her cholesterol is not at goal. The cholesterol last visit was:   Lab Results  Component Value Date   CHOL 175 01/15/2022   HDL 62 01/15/2022   LDLCALC 93 01/15/2022   TRIG 102 01/15/2022   CHOLHDL 2.8 01/15/2022   She has been working on diet and exercise for prediabetes, she is on bASA, she is on ACE/ARB and denies foot ulcerations, increased appetite, nausea, paresthesia of the feet, polydipsia, polyuria, visual disturbances, vomiting and weight loss. Last A1C in the office was:  Lab Results  Component Value Date   HGBA1C 5.9 (H) 01/15/2022   Last GFR: Lab Results  Component Value Date    GFRAA 103 12/28/2020   Patient is on Vitamin D supplement.   Lab Results  Component Value Date   VD25OH 83 09/26/2020     She has fatty liver per [place] 2012 with mild stable LFT elevations monitored  Lab Results  Component Value Date   ALT 23 01/15/2022   AST 21 01/15/2022   ALKPHOS 62 05/16/2017   BILITOT 0.8 01/15/2022      Current Medications:  Current Outpatient Medications on File Prior to Visit  Medication Sig Dispense Refill  . aspirin 81 MG tablet Take 81 mg by mouth daily.    . Cholecalciferol (VITAMIN D) 125 MCG (5000 UT) CAPS Take by mouth.    . famotidine (PEPCID) 40 MG tablet TAKE 1 TABLET(40 MG) BY MOUTH TWICE DAILY (Patient taking differently: as needed.) 180 tablet 1  . MAGNESIUM PO Take 500 mg by mouth.     . pantoprazole (PROTONIX) 40 MG tablet TAKE 1 TABLET(40 MG) BY MOUTH DAILY (Patient taking differently: as needed.) 90 tablet 3  . Pitavastatin Calcium (LIVALO) 4 MG TABS Take 1 tablet (4 mg total) by mouth at bedtime. 90 tablet 1   No current facility-administered medications on file prior to visit.   Allergies:  Allergies  Allergen Reactions  . Anesthetics, Halogenated     Difficulty with waking up from anesthesia from [facility], had aphasia, negative CT  . Crestor [Rosuvastatin]   .  Lopid [Gemfibrozil]   . Latex Rash and Swelling    [Rash] redness/swelling/rash   Medical History:  She has Class 3 severe obesity due to excess calories with serious comorbidity and body mass index (BMI) of 40.0 to 44.9 in adult [facility]); Essential hypertension; Hyperlipemia; Hiatal hernia with GERD; Vitamin D deficiency; NASH (nonalcoholic steatohepatitis); Medication management; Macular hole; Cataract; OSA on CPAP; Other abnormal glucose (prediabetes); and Aortic atherosclerosis (Anita Blake) on their problem list. Health Maintenance:   Immunization History  Administered Date(s) Administered  . DT (Pediatric) 05/10/2015  . PFIZER(Purple Top)SARS-COV-2 Vaccination 01/30/2020,  02/24/2020, 08/20/2020  . Td 07/27/2005   Preventative care: Colonoscopy 2017 Dr. Earlean Shawl, normal cologuard 06/2019 negative EGD 2017   Last mammogram: reports 2022 at GYN, - report requested dense breasts had diagnostic left 11/2019 at breast [facility] negative - needs to schedule.   MRI breasts: 02/2018, normal, resume 3D mammograms Last pap smear/pelvic exam: 10/2021- by Harle Battiest - sees annually DEXA: reports in 2021, managed by OBGYN - report requested  Prior vaccinations: TD or Tdap: 2016 Influenza: declines Pneumococcal: declines Prevnar13: declines Shingles/Zostavax: declines COVID 19: 2/2 + booster -   Names of Other Physician/Practitioners you currently use: 1. [facility] here for primary care 2. Dr. Kathrin Penner, Dr. Renford Dills, eye doctor, wears glasses has upcoming 08/2021 3. Dr. Randol Kern, Dr. Rayann Heman, dentist, last visit 2022 - sees q3 month   Patient Care Team: Unk Pinto, MD as PCP - General (Internal Medicine) Richmond Campbell, MD as Consulting Physician (Gastroenterology) Delila Pereyra, MD as Consulting Physician (Gynecology) Paralee Cancel, MD as Consulting Physician (Orthopedic Surgery)  Surgical History:  She has a past surgical history that includes Abdominal hysterectomy; Eye surgery (Left, 2007); cataract surgery (Left); right arm elbow surgery; index finger surgery (Right); Total hip arthroplasty (Left, 10/06/2013); and right 5th digit finger - cyst removal (Right, 06/04/2019). Family History:  Herfamily history includes Alzheimer's disease in an other family member; Heart disease in her mother; Kidney failure in her mother; Stroke in her father. Social History:  She reports that she has never smoked. She has never used smokeless tobacco. She reports that she does not drink alcohol and does not use drugs.  Review of Systems: Review of Systems  Constitutional:  Negative for malaise/fatigue and weight loss.  HENT:  Negative  for hearing loss and tinnitus.   Eyes:  Negative for blurred vision and double vision.  Respiratory:  Negative for cough, sputum production, shortness of breath and wheezing.   Cardiovascular:  Negative for chest pain, palpitations, orthopnea, claudication, leg swelling and PND.  Gastrointestinal:  Positive for abdominal pain. Negative for blood in stool, constipation, diarrhea, heartburn, melena, nausea and vomiting.       Bloating, distention, belching, gasiness  Genitourinary: Negative.   Musculoskeletal:  Negative for falls, joint pain and myalgias.  Skin:  Negative for rash.  Neurological:  Negative for dizziness, tingling, sensory change, weakness and headaches.  Endo/Heme/Allergies:  Negative for polydipsia.  Psychiatric/Behavioral: Negative.  Negative for depression, memory loss, substance abuse and suicidal ideas. The patient is not nervous/anxious and does not have insomnia.   All other systems reviewed and are negative.   Physical Exam: Estimated body mass index is 39.33 kg/m as calculated from the following:   Height as of this encounter: 5' 3"  (1.6 m).   Weight as of this encounter: 222 lb (100.7 kg). BP 122/Anita   Pulse (!) 52   Temp (!) 97.5 F (36.4 C)   Ht 5' 3"  (1.6 m)  Wt 222 lb (100.7 kg)   SpO2 98%   BMI 39.33 kg/m  General Appearance: Well nourished, in no apparent distress.  Eyes: PERRLA, EOMs, conjunctiva no swelling or erythema,  Sinuses: No Frontal/maxillary tenderness  ENT/Mouth: Ext aud canals clear, normal light reflex with TMs without erythema, bulging. Good dentition. No erythema, swelling, or exudate on post pharynx. Tonsils not swollen or erythematous. Hearing normal.  Neck: Supple, thyroid normal. No bruits  Respiratory: Respiratory effort normal, BS equal bilaterally without rales, rhonchi, wheezing or stridor.  Cardio: RRR without murmurs, rubs or gallops. Brisk peripheral pulses without edema.  Chest: symmetric, with normal excursions and  percussion.  Breasts: Defer to GYN Abdomen: Soft, obese abdomen limits exam, vague tenderness not localizing, no guarding, rebound, no specific palpable hernias, masses, or organomegaly.  Lymphatics: Non tender without lymphadenopathy.  Genitourinary: Defer to GYN Musculoskeletal: Full ROM all peripheral extremities,5/5 strength, and normal gait.  Skin: Warm, dry without rashes, lesions, ecchymosis. Neuro: Cranial nerves intact, reflexes equal bilaterally. Normal muscle tone, no cerebellar symptoms. Sensation intact.  Psych: Awake and oriented X 3, normal affect, Insight and Judgment appropriate.   EKG: NSR  Darrol Jump, NP 9:08 AM [facility]

## 2022-07-14 LAB — COMPLETE METABOLIC PANEL WITH GFR
AG Ratio: 1.5 (calc) (ref 1.0–2.5)
ALT: 48 U/L — ABNORMAL HIGH (ref 6–29)
AST: 43 U/L — ABNORMAL HIGH (ref 10–35)
Albumin: 4.3 g/dL (ref 3.6–5.1)
Alkaline phosphatase (APISO): 63 U/L (ref 37–153)
BUN: 23 mg/dL (ref 7–25)
CO2: 33 mmol/L — ABNORMAL HIGH (ref 20–32)
Calcium: 9.5 mg/dL (ref 8.6–10.4)
Chloride: 102 mmol/L (ref 98–110)
Creat: 0.75 mg/dL (ref 0.60–1.00)
Globulin: 2.8 g/dL (calc) (ref 1.9–3.7)
Glucose, Bld: 91 mg/dL (ref 65–99)
Potassium: 4.4 mmol/L (ref 3.5–5.3)
Sodium: 139 mmol/L (ref 135–146)
Total Bilirubin: 0.4 mg/dL (ref 0.2–1.2)
Total Protein: 7.1 g/dL (ref 6.1–8.1)
eGFR: 84 mL/min/{1.73_m2} (ref >=60)

## 2022-07-14 LAB — CBC WITH DIFFERENTIAL/PLATELET
Absolute Monocytes: 476 cells/uL (ref 200–950)
Basophils Absolute: 28 cells/uL (ref 0–200)
Basophils Relative: 0.4 %
Eosinophils Absolute: 235 cells/uL (ref 15–500)
Eosinophils Relative: 3.4 %
HCT: 39.8 % (ref 35.0–45.0)
Hemoglobin: 12.2 g/dL (ref 11.7–15.5)
Lymphs Abs: 2850 cells/uL (ref 850–3900)
MCH: 25.9 pg — ABNORMAL LOW (ref 27.0–33.0)
MCHC: 30.7 g/dL — ABNORMAL LOW (ref 32.0–36.0)
MCV: 84.5 fL (ref 80.0–100.0)
MPV: 11 fL (ref 7.5–12.5)
Monocytes Relative: 6.9 %
Neutro Abs: 3312 cells/uL (ref 1500–7800)
Neutrophils Relative %: 48 %
Platelets: 268 10*3/uL (ref 140–400)
RBC: 4.71 10*6/uL (ref 3.80–5.10)
RDW: 13.7 % (ref 11.0–15.0)
Total Lymphocyte: 41.3 %
WBC: 6.9 10*3/uL (ref 3.8–10.8)

## 2022-07-14 LAB — URINALYSIS, ROUTINE W REFLEX MICROSCOPIC
Bilirubin Urine: NEGATIVE
Glucose, UA: NEGATIVE
Hgb urine dipstick: NEGATIVE
Ketones, ur: NEGATIVE
Leukocytes,Ua: NEGATIVE
Nitrite: NEGATIVE
Protein, ur: NEGATIVE
Specific Gravity, Urine: 1.018 (ref 1.001–1.035)
pH: 6.5 (ref 5.0–8.0)

## 2022-07-14 LAB — HEMOGLOBIN A1C
Hgb A1c MFr Bld: 5.8 % of total Hgb — ABNORMAL HIGH (ref <5.7)
Mean Plasma Glucose: 120 mg/dL
eAG (mmol/L): 6.6 mmol/L

## 2022-07-14 LAB — LIPID PANEL
Cholesterol: 178 mg/dL (ref <200)
HDL: 62 mg/dL (ref >=50)
LDL Cholesterol (Calc): 99 mg/dL (calc)
Non-HDL Cholesterol (Calc): 116 mg/dL (calc) (ref <130)
Total CHOL/HDL Ratio: 2.9 (calc) (ref <5.0)
Triglycerides: 83 mg/dL (ref <150)

## 2022-07-14 LAB — TSH: TSH: 1.5 mIU/L (ref 0.40–4.50)

## 2022-07-14 LAB — VITAMIN D 25 HYDROXY (VIT D DEFICIENCY, FRACTURES): Vit D, 25-Hydroxy: 60 ng/mL (ref 30–100)

## 2022-07-14 LAB — MAGNESIUM: Magnesium: 2.1 mg/dL (ref 1.5–2.5)

## 2022-07-24 DIAGNOSIS — K219 Gastro-esophageal reflux disease without esophagitis: Secondary | ICD-10-CM | POA: Diagnosis not present

## 2022-07-24 DIAGNOSIS — M1990 Unspecified osteoarthritis, unspecified site: Secondary | ICD-10-CM | POA: Diagnosis not present

## 2022-07-24 DIAGNOSIS — G4733 Obstructive sleep apnea (adult) (pediatric): Secondary | ICD-10-CM | POA: Diagnosis not present

## 2022-07-24 DIAGNOSIS — R030 Elevated blood-pressure reading, without diagnosis of hypertension: Secondary | ICD-10-CM | POA: Diagnosis not present

## 2022-07-24 DIAGNOSIS — E785 Hyperlipidemia, unspecified: Secondary | ICD-10-CM | POA: Diagnosis not present

## 2022-07-24 DIAGNOSIS — Z6841 Body Mass Index (BMI) 40.0 and over, adult: Secondary | ICD-10-CM | POA: Diagnosis not present

## 2022-07-24 DIAGNOSIS — Z87892 Personal history of anaphylaxis: Secondary | ICD-10-CM | POA: Diagnosis not present

## 2022-07-31 DIAGNOSIS — M1712 Unilateral primary osteoarthritis, left knee: Secondary | ICD-10-CM | POA: Diagnosis not present

## 2022-08-14 ENCOUNTER — Telehealth: Payer: Self-pay | Admitting: Nurse Practitioner

## 2022-08-14 ENCOUNTER — Other Ambulatory Visit: Payer: Self-pay | Admitting: Nurse Practitioner

## 2022-08-14 DIAGNOSIS — M1712 Unilateral primary osteoarthritis, left knee: Secondary | ICD-10-CM | POA: Diagnosis not present

## 2022-08-14 DIAGNOSIS — Z1211 Encounter for screening for malignant neoplasm of colon: Secondary | ICD-10-CM

## 2022-08-14 NOTE — Telephone Encounter (Signed)
Pt received a notice that she due for her cologuard test, wanting a kit mailed to her please

## 2022-08-16 DIAGNOSIS — H2511 Age-related nuclear cataract, right eye: Secondary | ICD-10-CM | POA: Diagnosis not present

## 2022-08-16 DIAGNOSIS — H35342 Macular cyst, hole, or pseudohole, left eye: Secondary | ICD-10-CM | POA: Diagnosis not present

## 2022-08-16 DIAGNOSIS — Z961 Presence of intraocular lens: Secondary | ICD-10-CM | POA: Diagnosis not present

## 2022-08-16 DIAGNOSIS — H43811 Vitreous degeneration, right eye: Secondary | ICD-10-CM | POA: Diagnosis not present

## 2022-08-23 DIAGNOSIS — Z1211 Encounter for screening for malignant neoplasm of colon: Secondary | ICD-10-CM | POA: Diagnosis not present

## 2022-08-23 NOTE — Progress Notes (Unsigned)
PATIENT: Anita Blake DOB: 02-16-1946  REASON FOR VISIT: follow up HISTORY FROM: patient  Virtual Visit via Telephone Note  I connected with Charlann Lange Hagey on 08/28/22 at  9:00 AM EDT by telephone and verified that I am speaking with the correct person using two identifiers.   I discussed the limitations, risks, security and privacy concerns of performing an evaluation and management service by telephone and the availability of in person appointments. I also discussed with the patient that there may be a patient responsible charge related to this service. The patient expressed understanding and agreed to proceed.   History of Present Illness:  08/28/22 ALL (mychart): Vanesha returns for follow up for OSA on CPAP. She continues to do well. She denies concerns with machine or supplies. She uses CPAP every night when home for about 7-8 hours. She does not use CPAP when she travels. She does feel better when using CPAP.     08/22/2021 ALL (mychart): LOLETHA BERTINI is a 76 y.o. female here today for follow up for OSA on CPAP. She continues to do well. She is using CPAP every night for at least 4 hours. She denies concerns with machine or supplies.     08/16/20 ALL: DOROTHIE WAH is a 76 y.o. female here today for follow up for OSA on CPAP. She reports that she is doing well. She admits that she does not take her CPAP machine when traveling. She and her husband both use CPAP and she feels that it is too much to worry with when traveling. She uses CPAP consistently when home. She denies concerns with supplies or machine. She does continue to note benefit of therapy with improved sleep quality and improvement in nocturia when using CPAP.   Compliance report dated 07/16/2020 through 08/14/2020 reveals that she used CPAP 25 of the past 30 days for compliance of 83%.  She used CPAP greater than 4 hours 25 of the past 30 days for compliance of 83%.  Average usage on days used was 8 hours and 11 minutes.   Residual AHI was 1.8 on 5 to 12 cm of water and an EPR of 3.  There was no significant leak noted.   Observations/Objective:  Generalized: Well developed, in no acute distress  Mentation: Alert oriented to time, place, history taking. Follows all commands speech and language fluent   Assessment and Plan:  76 y.o. year old female  has a past medical history of Anemia (yrs ago), Arthritis, Complication of anesthesia, GERD (gastroesophageal reflux disease), HLD (hyperlipidemia), Hyperlipidemia, Labile hypertension, Nonalcoholic steatohepatitis (NASH), Prediabetes, and Vitamin D deficiency. here with    ICD-10-CM   1. OSA on CPAP  G47.33    Z99.89        Maral continues to do well on CPAP therapy. Compliance report reveals excellent compliance. She was encouraged to continue using CPAP nightly and greater than 4 hours each night. She will continue healthy lifestyle habits. Supply orders updated. She will return in 1 year.   No orders of the defined types were placed in this encounter.    No orders of the defined types were placed in this encounter.     Follow Up Instructions:  I discussed the assessment and treatment plan with the patient. The patient was provided an opportunity to ask questions and all were answered. The patient agreed with the plan and demonstrated an understanding of the instructions.   The patient was advised to call back or seek an  in-person evaluation if the symptoms worsen or if the condition fails to improve as anticipated.  I provided 15 minutes of non-face-to-face time during this encounter. Patient located at their place of residence during [place] visit. Provider is in the office.    Debbora Presto, NP

## 2022-08-23 NOTE — Patient Instructions (Signed)

## 2022-08-27 ENCOUNTER — Encounter: Payer: Self-pay | Admitting: *Deleted

## 2022-08-28 ENCOUNTER — Encounter: Payer: Self-pay | Admitting: Family Medicine

## 2022-08-28 ENCOUNTER — Telehealth (INDEPENDENT_AMBULATORY_CARE_PROVIDER_SITE_OTHER): Payer: Medicare PPO | Admitting: Family Medicine

## 2022-08-28 DIAGNOSIS — G4733 Obstructive sleep apnea (adult) (pediatric): Secondary | ICD-10-CM | POA: Diagnosis not present

## 2022-08-28 DIAGNOSIS — Z9989 Dependence on other enabling machines and devices: Secondary | ICD-10-CM | POA: Diagnosis not present

## 2022-08-28 LAB — COLOGUARD: COLOGUARD: NEGATIVE

## 2022-08-30 ENCOUNTER — Telehealth: Payer: Self-pay | Admitting: Neurology

## 2022-09-03 ENCOUNTER — Other Ambulatory Visit: Payer: Self-pay

## 2022-09-03 DIAGNOSIS — M13862 Other specified arthritis, left knee: Secondary | ICD-10-CM | POA: Diagnosis not present

## 2022-09-03 MED ORDER — LIVALO 4 MG PO TABS: 1.0000 | ORAL_TABLET | Freq: Every day | ORAL | 1 refills | Status: DC

## 2022-09-17 ENCOUNTER — Other Ambulatory Visit: Payer: Self-pay

## 2022-09-17 MED ORDER — LIVALO 4 MG PO TABS: 1.0000 | ORAL_TABLET | Freq: Every day | ORAL | 1 refills | Status: DC

## 2022-09-26 DIAGNOSIS — H04123 Dry eye syndrome of bilateral lacrimal glands: Secondary | ICD-10-CM | POA: Diagnosis not present

## 2022-09-26 DIAGNOSIS — H524 Presbyopia: Secondary | ICD-10-CM | POA: Diagnosis not present

## 2022-09-26 DIAGNOSIS — H52203 Unspecified astigmatism, bilateral: Secondary | ICD-10-CM | POA: Diagnosis not present

## 2022-09-26 DIAGNOSIS — H43811 Vitreous degeneration, right eye: Secondary | ICD-10-CM | POA: Diagnosis not present

## 2022-09-26 DIAGNOSIS — H2511 Age-related nuclear cataract, right eye: Secondary | ICD-10-CM | POA: Diagnosis not present

## 2022-09-26 DIAGNOSIS — R7303 Prediabetes: Secondary | ICD-10-CM | POA: Diagnosis not present

## 2022-10-17 ENCOUNTER — Encounter: Payer: Self-pay | Admitting: Nurse Practitioner

## 2022-10-17 ENCOUNTER — Ambulatory Visit (INDEPENDENT_AMBULATORY_CARE_PROVIDER_SITE_OTHER): Payer: Medicare PPO | Admitting: Nurse Practitioner

## 2022-10-17 VITALS — BP 142/80 | HR 60 | Temp 97.1°F | Ht 63.0 in | Wt 219.0 lb

## 2022-10-17 DIAGNOSIS — H6122 Impacted cerumen, left ear: Secondary | ICD-10-CM | POA: Diagnosis not present

## 2022-10-17 DIAGNOSIS — H9313 Tinnitus, bilateral: Secondary | ICD-10-CM

## 2022-10-17 DIAGNOSIS — R42 Dizziness and giddiness: Secondary | ICD-10-CM

## 2022-10-17 NOTE — Progress Notes (Signed)
Assessment and Plan:  Anita Blake was seen today for a .  Diagnoses and all order for this visit:  1. Dizziness Stay well hydrated. Change positions slowly  - Ambulatory referral to Audiology  2. Vertigo Possible idiopathic BPPV without association of N/V.   Continue to monitor  3. Tinnitus aurium, bilateral Refer for initial evaluation of hearing loss  - Ambulatory referral to Audiology  4. Left ear impacted cerumen Left ear(s) irrigated with lukewarm water, hydrogen peroxide. Large piece of brown cerumen x3 extracted with ear curette. TM:  WNL.  Pt tolerated procedure well.    - Ear Lavage   Continue to monitor for any increase in fever, chills, N/V, diarrhea, changes to bowel habits, blood in stool.  Notify office for further evaluation and treatment, questions or concerns if s/s fail to improve. The risks and benefits of my recommendations, as well as other treatment options were discussed with the patient today. Questions were answered.  Further disposition pending results of labs. Discussed med's effects and SE's.    Over 20 minutes of exam, counseling, chart review, and critical decision making was performed.   Future Appointments  Date Time Provider [place]  01/15/2023 11:00 AM Alycia Rossetti, NP [facility] None  07/15/2023 10:00 AM Darrol Jump, NP [facility] None  09/03/2023  9:15 AM Lomax, Amy, NP [facility] None    ------------------------------------------------------------------------------------------------------------------   HPI BP (!) 142/80   Pulse 60   Temp (!) 97.1 F (36.2 C)   Ht 5' 3"  (1.6 m)   Wt 219 lb (99.3 kg)   SpO2 98%   BMI 38.79 kg/m    Patient presents for evaluation of dizziness. The symptoms started about a month ago and have been intermittent. The attacks occur frequently and last 3 minutes. Positions that worsen symptoms: any motion. Previous workup/treatments: none. Associated ear symptoms: tinnitus. Associated  CNS symptoms: none. Recent infections: none. Head trauma: denied. Drug ingestion: none. Noise exposure: no occupational exposure.   She has not had an initial hearing exam.  She does not wear hearing aides.  She does have left cerumen impaction.    Past Medical History:  Diagnosis Date   Anemia yrs ago   Arthritis    Complication of anesthesia    slow to wake up   GERD (gastroesophageal reflux disease)    HLD (hyperlipidemia)    Hyperlipidemia    Labile hypertension    no meds   Nonalcoholic steatohepatitis (NASH)    Prediabetes    Vitamin D deficiency      Allergies  Allergen Reactions   Anesthetics, Halogenated     Difficulty with waking up from anesthesia from [facility], had aphasia, negative CT   Crestor [Rosuvastatin]    Lopid [Gemfibrozil]    Latex Rash and Swelling    [Rash] redness/swelling/rash    Current Outpatient Medications on File Prior to Visit  Medication Sig   aspirin 81 MG tablet Take 81 mg by mouth daily.   Cholecalciferol (VITAMIN D) 125 MCG (5000 UT) CAPS Take by mouth.   famotidine (PEPCID) 40 MG tablet TAKE 1 TABLET(40 MG) BY MOUTH TWICE DAILY (Patient taking differently: as needed.)   MAGNESIUM PO Take 500 mg by mouth.    pantoprazole (PROTONIX) 40 MG tablet TAKE 1 TABLET(40 MG) BY MOUTH DAILY (Patient taking differently: as needed.)   Pitavastatin Calcium (LIVALO) 4 MG TABS Take 1 tablet (4 mg total) by mouth at bedtime.   No current facility-administered medications on file prior to visit.  ROS: all negative except what is noted in the HPI.   Physical Exam:  BP (!) 142/80   Pulse 60   Temp (!) 97.1 F (36.2 C)   Ht 5' 3"  (1.6 m)   Wt 219 lb (99.3 kg)   SpO2 98%   BMI 38.79 kg/m   General Appearance: NAD.  Awake, conversant and cooperative. Eyes: PERRLA, EOMs intact.  Sclera white.  Conjunctiva without erythema. Sinuses: No frontal/maxillary tenderness.  No nasal discharge. Nares patent.  ENT/Mouth: Left TM UTA d/t cerumen impaction.   Ext aud canals clear.  Hearing intact.  Posterior pharynx without swelling or exudate.  Tonsils without swelling or erythema.  Neck: Supple.  No masses, nodules or thyromegaly. Respiratory: Effort is regular with non-labored breathing. Breath sounds are equal bilaterally without rales, rhonchi, wheezing or stridor.  Cardio: RRR with no MRGs. Brisk peripheral pulses without edema.  Abdomen: Active BS in all four quadrants.  Soft and non-tender without guarding, rebound tenderness, hernias or masses. Lymphatics: Non tender without lymphadenopathy.  Musculoskeletal: Full ROM, 5/5 strength, normal ambulation.  No clubbing or cyanosis. Skin: Appropriate color for ethnicity. Warm without rashes, lesions, ecchymosis, ulcers.  Neuro: CN II-XII grossly normal. Normal muscle tone without cerebellar symptoms and intact sensation.   Psych: AO X 3,  appropriate mood and affect, insight and judgment.     Darrol Jump, NP 2:39 PM [facility]

## 2022-10-30 ENCOUNTER — Ambulatory Visit (INDEPENDENT_AMBULATORY_CARE_PROVIDER_SITE_OTHER): Payer: Medicare PPO | Admitting: Nurse Practitioner

## 2022-10-30 ENCOUNTER — Encounter: Payer: Self-pay | Admitting: Nurse Practitioner

## 2022-10-30 VITALS — BP 148/80 | HR 64 | Temp 98.0°F | Resp 16 | Ht 63.0 in | Wt 225.0 lb

## 2022-10-30 DIAGNOSIS — R319 Hematuria, unspecified: Secondary | ICD-10-CM | POA: Diagnosis not present

## 2022-10-30 DIAGNOSIS — R1032 Left lower quadrant pain: Secondary | ICD-10-CM

## 2022-10-30 DIAGNOSIS — R350 Frequency of micturition: Secondary | ICD-10-CM | POA: Diagnosis not present

## 2022-10-30 DIAGNOSIS — R300 Dysuria: Secondary | ICD-10-CM

## 2022-10-30 DIAGNOSIS — K5792 Diverticulitis of intestine, part unspecified, without perforation or abscess without bleeding: Secondary | ICD-10-CM

## 2022-10-30 DIAGNOSIS — R1031 Right lower quadrant pain: Secondary | ICD-10-CM | POA: Diagnosis not present

## 2022-10-30 DIAGNOSIS — R197 Diarrhea, unspecified: Secondary | ICD-10-CM | POA: Diagnosis not present

## 2022-10-30 MED ORDER — METRONIDAZOLE 500 MG PO TABS: 500.0000 mg | ORAL_TABLET | Freq: Two times a day (BID) | ORAL | 0 refills | Status: AC

## 2022-10-30 MED ORDER — CIPROFLOXACIN HCL 750 MG PO TABS: 750.0000 mg | ORAL_TABLET | Freq: Two times a day (BID) | ORAL | 0 refills | Status: AC

## 2022-10-30 NOTE — Progress Notes (Signed)
Assessment and Plan:  Anita Blake was seen today for a follow up.  Diagnoses and all order for this visit:  1. Diverticulitis Continue to monitor for increase in fever, chills, N/V, uncontrolled abdominal pain. Report to ER if s/s fail to improve Continue Pantoprazole  - CBC with Differential/Platelet - COMPLETE METABOLIC PANEL WITH GFR - ciprofloxacin (CIPRO) 750 MG tablet; Take 1 tablet (750 mg total) by mouth 2 (two) times daily for 7 days.  Dispense: 14 tablet; Refill: 0 - metroNIDAZOLE (FLAGYL) 500 MG tablet; Take 1 tablet (500 mg total) by mouth 2 (two) times daily for 7 days.  Dispense: 14 tablet; Refill: 0  2. Dysuria Stay well hydrated to keep system flushed May take AZO OTC PRN for pain  - Urinalysis w microscopic + reflex cultur  3. Frequency of urination Continue to monitor  4. Hematuria, unspecified type May be from diarrhea. Continue to monitor  - CBC with Differential/Platelet  5. Left and Right lower quadrant abdominal pain Continue to monitor  6. Bloody diarrhea Report to ER if s/s fail to improve  - CBC with Differential/Platelet - COMPLETE METABOLIC PANEL WITH GFR  Notify office for further evaluation and treatment, questions or concerns if s/s fail to improve. The risks and benefits of my recommendations, as well as other treatment options were discussed with the patient today. Questions were answered.  Further disposition pending results of labs. Discussed med's effects and SE's.    Over 20 minutes of exam, counseling, chart review, and critical decision making was performed.   Future Appointments  Date Time Provider [place]  01/15/2023 11:00 AM Alycia Rossetti, NP [facility] None  07/15/2023 10:00 AM Darrol Jump, NP [facility] None  09/03/2023  9:15 AM Lomax, Amy, NP [facility] None    ------------------------------------------------------------------------------------------------------------------   HPI BP (!) 148/80   Pulse  64   Temp 98 F (36.7 C)   Resp 16   Ht 5' 3"  (1.6 m)   Wt 225 lb (102.1 kg)   SpO2 98%   BMI 39.86 kg/m    Patient complains of diarrhea, LLQ abdominal pain, melena, and dizziness . The pain is described as colicky and dull, and is a 4/10 in intensity. Onset was 2 weeks ago. Symptoms have been gradually worsening since that time. Associated symptoms: belching, dysuria, frequency, hematuria, and melena. Aggravating factors: bowel movement, eating. Alleviating factors: none. The patient denies anorexia, chills, constipation, and vomiting.  She has noticed bright red flank blood when wiping and in the toilet.  Unsure if it is from urine or BM. She has been taking famotidine and pantoprazole without relief.  She reports that she has a hx of diverticulosis.  No recent flare.   She also complains of dysuria and frequency. She has had symptoms for 2 weeks.  Patient denies back pain and vaginal discharge. Patient does not have a history of recurrent UTI. Patient does not have a history of pyelonephritis.     Past Medical History:  Diagnosis Date   Anemia yrs ago   Arthritis    Complication of anesthesia    slow to wake up   GERD (gastroesophageal reflux disease)    HLD (hyperlipidemia)    Hyperlipidemia    Labile hypertension    no meds   Nonalcoholic steatohepatitis (NASH)    Prediabetes    Vitamin D deficiency      Allergies  Allergen Reactions   Anesthetics, Halogenated     Difficulty with waking up from anesthesia from THR,  had aphasia, negative CT   Crestor [Rosuvastatin]    Lopid [Gemfibrozil]    Latex Rash and Swelling    [Rash] redness/swelling/rash    Current Outpatient Medications on File Prior to Visit  Medication Sig   aspirin 81 MG tablet Take 81 mg by mouth daily.   Cholecalciferol (VITAMIN D) 125 MCG (5000 UT) CAPS Take by mouth.   famotidine (PEPCID) 40 MG tablet TAKE 1 TABLET(40 MG) BY MOUTH TWICE DAILY (Patient taking differently: as needed.)   MAGNESIUM PO  Take 500 mg by mouth.    pantoprazole (PROTONIX) 40 MG tablet TAKE 1 TABLET(40 MG) BY MOUTH DAILY (Patient taking differently: as needed.)   Pitavastatin Calcium (LIVALO) 4 MG TABS Take 1 tablet (4 mg total) by mouth at bedtime.   No current facility-administered medications on file prior to visit.    ROS: all negative except what is noted in the HPI.   Physical Exam:  BP (!) 148/80   Pulse 64   Temp 98 F (36.7 C)   Resp 16   Ht 5' 3"  (1.6 m)   Wt 225 lb (102.1 kg)   SpO2 98%   BMI 39.86 kg/m   General Appearance: NAD.  Awake, conversant and cooperative. Eyes: PERRLA, EOMs intact.  Sclera white.  Conjunctiva without erythema. Sinuses: No frontal/maxillary tenderness.  No nasal discharge. Nares patent.  ENT/Mouth: Ext aud canals clear.  Bilateral TMs w/DOL and without erythema or bulging. Hearing intact.  Posterior pharynx without swelling or exudate.  Tonsils without swelling or erythema.  Neck: Supple.  No masses, nodules or thyromegaly. Respiratory: Effort is regular with non-labored breathing. Breath sounds are equal bilaterally without rales, rhonchi, wheezing or stridor.  Cardio: RRR with no MRGs. Brisk peripheral pulses without edema.  Abdomen: Active BS in all four quadrants.  Tender to palpation LLQ and RLQ. Lymphatics: Non tender without lymphadenopathy.  Musculoskeletal: Full ROM, 5/5 strength, normal ambulation.  No clubbing or cyanosis. Skin: Appropriate color for ethnicity. Warm without rashes, lesions, ecchymosis, ulcers.  Neuro: CN II-XII grossly normal. Normal muscle tone without cerebellar symptoms and intact sensation.   Psych: AO X 3,  appropriate mood and affect, insight and judgment.     Darrol Jump, NP 3:04 PM [facility]

## 2022-10-30 NOTE — Patient Instructions (Signed)
Diverticulitis  Diverticulitis is infection or inflammation of small pouches (diverticula) in the colon that form due to a condition called diverticulosis. Diverticula can trap stool (feces) and bacteria, causing infection and inflammation. Diverticulitis may cause severe stomach pain and diarrhea. It may lead to tissue damage in the colon that causes bleeding or blockage. The diverticula may also burst (rupture) and cause infected stool to enter other areas of the abdomen. What are the causes? This condition is caused by stool becoming trapped in the diverticula, which allows bacteria to grow in the diverticula. This leads to inflammation and infection. What increases the risk? You are more likely to develop this condition if you have diverticulosis. The risk increases if you: Are overweight or obese. Do not get enough exercise. Drink alcohol. Use tobacco products. Eat a diet that has a lot of red meat such as beef, pork, or lamb. Eat a diet that does not include enough fiber. High-fiber foods include fruits, vegetables, beans, nuts, and whole grains. Are over 40 years of age. What are the signs or symptoms? Symptoms of this condition may include: Pain and tenderness in the abdomen. The pain is normally located on the left side of the abdomen, but it may occur in other areas. Fever and chills. Nausea. Vomiting. Cramping. Bloating. Changes in bowel routines. Blood in your stool. How is this diagnosed? This condition is diagnosed based on: Your medical history. A physical exam. Tests to make sure there is nothing else causing your condition. These tests may include: Blood tests. Urine tests. CT scan of the abdomen. How is this treated? Most cases of this condition are mild and can be treated at home. Treatment may include: Taking over-the-counter pain medicines. Following a clear liquid diet. Taking antibiotic medicines by mouth. Resting. More severe cases may need to be treated  at a hospital. Treatment may include: Not eating or drinking. Taking prescription pain medicine. Receiving antibiotic medicines through an IV. Receiving fluids and nutrition through an IV. Surgery. When your condition is under control, your health care provider may recommend that you have a colonoscopy. This is an exam to look at the entire large intestine. During the exam, a lubricated, bendable tube is inserted into the anus and then passed into the rectum, colon, and other parts of the large intestine. A colonoscopy can show how severe your diverticula are and whether something else may be causing your symptoms. Follow these instructions at home: Medicines Take over-the-counter and prescription medicines only as told by your health care provider. These include fiber supplements, probiotics, and stool softeners. If you were prescribed an antibiotic medicine, take it as told by your health care provider. Do not stop taking the antibiotic even if you start to feel better. Ask your health care provider if the medicine prescribed to you requires you to avoid driving or using machinery. Eating and drinking  Follow a full liquid diet or another diet as directed by your health care provider. After your symptoms improve, your health care provider may tell you to change your diet. He or she may recommend that you eat a diet that contains at least 25 grams (25 g) of fiber daily. Fiber makes it easier to pass stool. Healthy sources of fiber include: Berries. One cup contains 4-8 grams of fiber. Beans or lentils. One-half cup contains 5-8 grams of fiber. Green vegetables. One cup contains 4 grams of fiber. Avoid eating red meat. General instructions Do not use any products that contain nicotine or tobacco, such as   cigarettes, e-cigarettes, and chewing tobacco. If you need help quitting, ask your health care provider. Exercise for at least 30 minutes, 3 times each week. You should exercise hard enough to  raise your heart rate and break a sweat. Keep all follow-up visits as told by your health care provider. This is important. You may need to have a colonoscopy. Contact a health care provider if: Your pain does not improve. Your bowel movements do not return to normal. Get help right away if: Your pain gets worse. Your symptoms do not get better with treatment. Your symptoms suddenly get worse. You have a fever. You vomit more than one time. You have stools that are bloody, black, or tarry. Summary Diverticulitis is infection or inflammation of small pouches (diverticula) in the colon that form due to a condition called diverticulosis. Diverticula can trap stool (feces) and bacteria, causing infection and inflammation. You are at higher risk for this condition if you have diverticulosis and you eat a diet that does not include enough fiber. Most cases of this condition are mild and can be treated at home. More severe cases may need to be treated at a hospital. When your condition is under control, your health care provider may recommend that you have an exam called a colonoscopy. This exam can show how severe your diverticula are and whether something else may be causing your symptoms. Keep all follow-up visits as told by your health care provider. This is important. This information is not intended to replace advice given to you by your health care provider. Make sure you discuss any questions you have with your health care provider. Document Revised: 09/14/2019 Document Reviewed: 09/14/2019 Elsevier Patient Education  2023 Elsevier Inc.  

## 2022-10-31 LAB — CBC WITH DIFFERENTIAL/PLATELET
Absolute Monocytes: 616 cells/uL (ref 200–950)
Basophils Absolute: 39 cells/uL (ref 0–200)
Basophils Relative: 0.5 %
Eosinophils Absolute: 231 cells/uL (ref 15–500)
Eosinophils Relative: 3 %
HCT: 36.8 % (ref 35.0–45.0)
Hemoglobin: 12 g/dL (ref 11.7–15.5)
Lymphs Abs: 2610 cells/uL (ref 850–3900)
MCH: 26.5 pg — ABNORMAL LOW (ref 27.0–33.0)
MCHC: 32.6 g/dL (ref 32.0–36.0)
MCV: 81.2 fL (ref 80.0–100.0)
MPV: 11 fL (ref 7.5–12.5)
Monocytes Relative: 8 %
Neutro Abs: 4204 cells/uL (ref 1500–7800)
Neutrophils Relative %: 54.6 %
Platelets: 242 10*3/uL (ref 140–400)
RBC: 4.53 10*6/uL (ref 3.80–5.10)
RDW: 14.2 % (ref 11.0–15.0)
Total Lymphocyte: 33.9 %
WBC: 7.7 10*3/uL (ref 3.8–10.8)

## 2022-10-31 LAB — URINALYSIS W MICROSCOPIC + REFLEX CULTURE
Bacteria, UA: NONE SEEN /HPF
Bilirubin Urine: NEGATIVE
Glucose, UA: NEGATIVE
Hgb urine dipstick: NEGATIVE
Hyaline Cast: NONE SEEN /LPF
Ketones, ur: NEGATIVE
Leukocyte Esterase: NEGATIVE
Nitrites, Initial: NEGATIVE
Protein, ur: NEGATIVE
RBC / HPF: NONE SEEN /HPF (ref 0–2)
Specific Gravity, Urine: 1.009 (ref 1.001–1.035)
WBC, UA: NONE SEEN /HPF (ref 0–5)
pH: 6.5 (ref 5.0–8.0)

## 2022-10-31 LAB — COMPLETE METABOLIC PANEL WITH GFR
AG Ratio: 1.6 (calc) (ref 1.0–2.5)
ALT: 26 U/L (ref 6–29)
AST: 24 U/L (ref 10–35)
Albumin: 4.1 g/dL (ref 3.6–5.1)
Alkaline phosphatase (APISO): 50 U/L (ref 37–153)
BUN: 19 mg/dL (ref 7–25)
CO2: 34 mmol/L — ABNORMAL HIGH (ref 20–32)
Calcium: 9.3 mg/dL (ref 8.6–10.4)
Chloride: 102 mmol/L (ref 98–110)
Creat: 0.77 mg/dL (ref 0.60–1.00)
Globulin: 2.6 g/dL (calc) (ref 1.9–3.7)
Glucose, Bld: 71 mg/dL (ref 65–99)
Potassium: 4.4 mmol/L (ref 3.5–5.3)
Sodium: 142 mmol/L (ref 135–146)
Total Bilirubin: 0.7 mg/dL (ref 0.2–1.2)
Total Protein: 6.7 g/dL (ref 6.1–8.1)
eGFR: 81 mL/min/{1.73_m2} (ref >=60)

## 2022-10-31 LAB — NO CULTURE INDICATED

## 2022-11-07 DIAGNOSIS — H9042 Sensorineural hearing loss, unilateral, left ear, with unrestricted hearing on the contralateral side: Secondary | ICD-10-CM | POA: Diagnosis not present

## 2022-11-12 DIAGNOSIS — Z1231 Encounter for screening mammogram for malignant neoplasm of breast: Secondary | ICD-10-CM | POA: Diagnosis not present

## 2022-11-15 DIAGNOSIS — R1012 Left upper quadrant pain: Secondary | ICD-10-CM | POA: Diagnosis not present

## 2022-11-15 DIAGNOSIS — R197 Diarrhea, unspecified: Secondary | ICD-10-CM | POA: Diagnosis not present

## 2022-11-15 DIAGNOSIS — K222 Esophageal obstruction: Secondary | ICD-10-CM | POA: Diagnosis not present

## 2022-11-15 DIAGNOSIS — R011 Cardiac murmur, unspecified: Secondary | ICD-10-CM | POA: Diagnosis not present

## 2022-11-15 DIAGNOSIS — R1032 Left lower quadrant pain: Secondary | ICD-10-CM | POA: Diagnosis not present

## 2022-11-15 DIAGNOSIS — R0989 Other specified symptoms and signs involving the circulatory and respiratory systems: Secondary | ICD-10-CM | POA: Diagnosis not present

## 2022-11-15 DIAGNOSIS — R1013 Epigastric pain: Secondary | ICD-10-CM | POA: Diagnosis not present

## 2022-11-15 DIAGNOSIS — Z8719 Personal history of other diseases of the digestive system: Secondary | ICD-10-CM | POA: Diagnosis not present

## 2022-11-29 ENCOUNTER — Encounter: Payer: Self-pay | Admitting: Internal Medicine

## 2022-11-29 DIAGNOSIS — R0989 Other specified symptoms and signs involving the circulatory and respiratory systems: Secondary | ICD-10-CM | POA: Diagnosis not present

## 2022-12-07 DIAGNOSIS — R1013 Epigastric pain: Secondary | ICD-10-CM | POA: Diagnosis not present

## 2022-12-07 DIAGNOSIS — R109 Unspecified abdominal pain: Secondary | ICD-10-CM | POA: Diagnosis not present

## 2022-12-20 DIAGNOSIS — R509 Fever, unspecified: Secondary | ICD-10-CM | POA: Diagnosis not present

## 2022-12-20 DIAGNOSIS — J208 Acute bronchitis due to other specified organisms: Secondary | ICD-10-CM | POA: Diagnosis not present

## 2022-12-20 DIAGNOSIS — R051 Acute cough: Secondary | ICD-10-CM | POA: Diagnosis not present

## 2022-12-20 DIAGNOSIS — B9689 Other specified bacterial agents as the cause of diseases classified elsewhere: Secondary | ICD-10-CM | POA: Diagnosis not present

## 2022-12-20 DIAGNOSIS — J029 Acute pharyngitis, unspecified: Secondary | ICD-10-CM | POA: Diagnosis not present

## 2022-12-20 DIAGNOSIS — Q2546 Tortuous aortic arch: Secondary | ICD-10-CM | POA: Diagnosis not present

## 2022-12-20 DIAGNOSIS — R0981 Nasal congestion: Secondary | ICD-10-CM | POA: Diagnosis not present

## 2022-12-20 DIAGNOSIS — R0602 Shortness of breath: Secondary | ICD-10-CM | POA: Diagnosis not present

## 2023-01-02 DIAGNOSIS — R011 Cardiac murmur, unspecified: Secondary | ICD-10-CM | POA: Diagnosis not present

## 2023-01-06 DIAGNOSIS — M549 Dorsalgia, unspecified: Secondary | ICD-10-CM | POA: Diagnosis not present

## 2023-01-06 DIAGNOSIS — R079 Chest pain, unspecified: Secondary | ICD-10-CM | POA: Diagnosis not present

## 2023-01-06 DIAGNOSIS — R0789 Other chest pain: Secondary | ICD-10-CM | POA: Diagnosis not present

## 2023-01-06 DIAGNOSIS — R1012 Left upper quadrant pain: Secondary | ICD-10-CM | POA: Diagnosis not present

## 2023-01-10 DIAGNOSIS — N952 Postmenopausal atrophic vaginitis: Secondary | ICD-10-CM | POA: Diagnosis not present

## 2023-01-10 DIAGNOSIS — Z01419 Encounter for gynecological examination (general) (routine) without abnormal findings: Secondary | ICD-10-CM | POA: Diagnosis not present

## 2023-01-10 DIAGNOSIS — Z6839 Body mass index (BMI) 39.0-39.9, adult: Secondary | ICD-10-CM | POA: Diagnosis not present

## 2023-01-15 ENCOUNTER — Ambulatory Visit: Payer: Medicare PPO | Admitting: Nurse Practitioner

## 2023-02-08 DIAGNOSIS — G4733 Obstructive sleep apnea (adult) (pediatric): Secondary | ICD-10-CM | POA: Diagnosis not present

## 2023-03-14 DIAGNOSIS — M7989 Other specified soft tissue disorders: Secondary | ICD-10-CM | POA: Diagnosis not present

## 2023-03-14 DIAGNOSIS — M79651 Pain in right thigh: Secondary | ICD-10-CM | POA: Diagnosis not present

## 2023-03-14 DIAGNOSIS — M79604 Pain in right leg: Secondary | ICD-10-CM | POA: Diagnosis not present

## 2023-05-09 DIAGNOSIS — G4733 Obstructive sleep apnea (adult) (pediatric): Secondary | ICD-10-CM | POA: Diagnosis not present

## 2023-05-17 DIAGNOSIS — M1990 Unspecified osteoarthritis, unspecified site: Secondary | ICD-10-CM | POA: Diagnosis not present

## 2023-05-17 DIAGNOSIS — E785 Hyperlipidemia, unspecified: Secondary | ICD-10-CM | POA: Diagnosis not present

## 2023-05-17 DIAGNOSIS — N182 Chronic kidney disease, stage 2 (mild): Secondary | ICD-10-CM | POA: Diagnosis not present

## 2023-05-17 DIAGNOSIS — Z823 Family history of stroke: Secondary | ICD-10-CM | POA: Diagnosis not present

## 2023-05-17 DIAGNOSIS — G4733 Obstructive sleep apnea (adult) (pediatric): Secondary | ICD-10-CM | POA: Diagnosis not present

## 2023-05-17 DIAGNOSIS — R011 Cardiac murmur, unspecified: Secondary | ICD-10-CM | POA: Diagnosis not present

## 2023-05-17 DIAGNOSIS — Z8249 Family history of ischemic heart disease and other diseases of the circulatory system: Secondary | ICD-10-CM | POA: Diagnosis not present

## 2023-05-17 DIAGNOSIS — Z87892 Personal history of anaphylaxis: Secondary | ICD-10-CM | POA: Diagnosis not present

## 2023-05-27 DIAGNOSIS — Z1211 Encounter for screening for malignant neoplasm of colon: Secondary | ICD-10-CM | POA: Diagnosis not present

## 2023-05-27 DIAGNOSIS — I10 Essential (primary) hypertension: Secondary | ICD-10-CM | POA: Diagnosis not present

## 2023-05-27 DIAGNOSIS — E7800 Pure hypercholesterolemia, unspecified: Secondary | ICD-10-CM | POA: Diagnosis not present

## 2023-05-27 DIAGNOSIS — E785 Hyperlipidemia, unspecified: Secondary | ICD-10-CM | POA: Diagnosis not present

## 2023-05-27 DIAGNOSIS — I700 Atherosclerosis of aorta: Secondary | ICD-10-CM | POA: Diagnosis not present

## 2023-05-27 DIAGNOSIS — M79604 Pain in right leg: Secondary | ICD-10-CM | POA: Diagnosis not present

## 2023-05-27 DIAGNOSIS — R7303 Prediabetes: Secondary | ICD-10-CM | POA: Diagnosis not present

## 2023-05-27 DIAGNOSIS — Z0000 Encounter for general adult medical examination without abnormal findings: Secondary | ICD-10-CM | POA: Diagnosis not present

## 2023-06-12 DIAGNOSIS — M6281 Muscle weakness (generalized): Secondary | ICD-10-CM | POA: Diagnosis not present

## 2023-06-12 DIAGNOSIS — M79604 Pain in right leg: Secondary | ICD-10-CM | POA: Diagnosis not present

## 2023-06-25 DIAGNOSIS — M6281 Muscle weakness (generalized): Secondary | ICD-10-CM | POA: Diagnosis not present

## 2023-06-25 DIAGNOSIS — M79604 Pain in right leg: Secondary | ICD-10-CM | POA: Diagnosis not present

## 2023-06-26 DIAGNOSIS — K449 Diaphragmatic hernia without obstruction or gangrene: Secondary | ICD-10-CM | POA: Diagnosis not present

## 2023-06-26 DIAGNOSIS — K219 Gastro-esophageal reflux disease without esophagitis: Secondary | ICD-10-CM | POA: Diagnosis not present

## 2023-06-26 DIAGNOSIS — Z1211 Encounter for screening for malignant neoplasm of colon: Secondary | ICD-10-CM | POA: Diagnosis not present

## 2023-07-15 ENCOUNTER — Encounter: Payer: Self-pay | Admitting: Nurse Practitioner

## 2023-07-15 ENCOUNTER — Ambulatory Visit (INDEPENDENT_AMBULATORY_CARE_PROVIDER_SITE_OTHER): Payer: Medicare PPO | Admitting: Nurse Practitioner

## 2023-07-15 VITALS — BP 140/84 | HR 67 | Temp 97.9°F | Ht 63.0 in | Wt 222.2 lb

## 2023-07-15 DIAGNOSIS — Z1389 Encounter for screening for other disorder: Secondary | ICD-10-CM

## 2023-07-15 DIAGNOSIS — H2510 Age-related nuclear cataract, unspecified eye: Secondary | ICD-10-CM

## 2023-07-15 DIAGNOSIS — H35349 Macular cyst, hole, or pseudohole, unspecified eye: Secondary | ICD-10-CM

## 2023-07-15 DIAGNOSIS — E538 Deficiency of other specified B group vitamins: Secondary | ICD-10-CM | POA: Diagnosis not present

## 2023-07-15 DIAGNOSIS — R7309 Other abnormal glucose: Secondary | ICD-10-CM | POA: Diagnosis not present

## 2023-07-15 DIAGNOSIS — Z1211 Encounter for screening for malignant neoplasm of colon: Secondary | ICD-10-CM

## 2023-07-15 DIAGNOSIS — R7303 Prediabetes: Secondary | ICD-10-CM | POA: Diagnosis not present

## 2023-07-15 DIAGNOSIS — I700 Atherosclerosis of aorta: Secondary | ICD-10-CM

## 2023-07-15 DIAGNOSIS — E559 Vitamin D deficiency, unspecified: Secondary | ICD-10-CM

## 2023-07-15 DIAGNOSIS — Z79899 Other long term (current) drug therapy: Secondary | ICD-10-CM | POA: Diagnosis not present

## 2023-07-15 DIAGNOSIS — K449 Diaphragmatic hernia without obstruction or gangrene: Secondary | ICD-10-CM

## 2023-07-15 DIAGNOSIS — E785 Hyperlipidemia, unspecified: Secondary | ICD-10-CM | POA: Diagnosis not present

## 2023-07-15 DIAGNOSIS — Z0001 Encounter for general adult medical examination with abnormal findings: Secondary | ICD-10-CM | POA: Diagnosis not present

## 2023-07-15 DIAGNOSIS — K7581 Nonalcoholic steatohepatitis (NASH): Secondary | ICD-10-CM

## 2023-07-15 DIAGNOSIS — Z0000 Encounter for general adult medical examination without abnormal findings: Secondary | ICD-10-CM | POA: Diagnosis not present

## 2023-07-15 DIAGNOSIS — Z136 Encounter for screening for cardiovascular disorders: Secondary | ICD-10-CM

## 2023-07-15 DIAGNOSIS — G4733 Obstructive sleep apnea (adult) (pediatric): Secondary | ICD-10-CM

## 2023-07-15 DIAGNOSIS — I10 Essential (primary) hypertension: Secondary | ICD-10-CM

## 2023-07-15 MED ORDER — PANTOPRAZOLE SODIUM 40 MG PO TBEC
40.0000 mg | DELAYED_RELEASE_TABLET | Freq: Every day | ORAL | 0 refills | Status: AC | PRN
Start: 2023-07-15 — End: ?

## 2023-07-15 NOTE — Progress Notes (Signed)
Complete Physical  Assessment and Plan:  Encounter for Annual Physical Exam with abnormal findings Due annually  Health Maintenance reviewed  Atherosclerosis of aorta (HCC) - per CT 06/2020 Discussed lifestyle modifications. Recommended diet heavy in fruits and veggies, omega 3's. Decrease consumption of animal meats, cheeses, and dairy products. Remain active and exercise as tolerated. Continue to monitor. Check lipids  Essential Hypertension Controlled  Continue medications;  Discussed DASH (Dietary Approaches to Stop Hypertension) DASH diet is lower in sodium than a typical American diet. Cut back on foods that are high in saturated fat, cholesterol, and trans fats. Eat more whole-grain foods, fish, poultry, and nuts Remain active and exercise as tolerated daily.  Monitor BP at home-Call if greater than 130/80.   NASH (nonalcoholic steatohepatitis) Weight loss advised, low processed carb diet, avoid alcohol/tylenol Monitor LFTs/CMP/GFR  Other abnormal glucose (Prediabetes) Education: Reviewed 'ABCs' of diabetes management  A1C (<7) Blood pressure (<130/80) Cholesterol (LDL <70) Continue Eye Exam yearly  Continue Dental Exam Q6 mo Discussed dietary recommendations Discussed Physical Activity recommendations Foot exam UTD Check A1C  Age-related nuclear cataract, unspecified laterality No recent changes Followed by ophthalmology  Continue to monitor  Macular hole, unspecified laterality No recent changes Followed by ophthalmology Continue to monitor  Hyperlipidemia, unspecified hyperlipidemia type Discussed lifestyle modifications. Recommended diet heavy in fruits and veggies, omega 3's. Decrease consumption of animal meats, cheeses, and dairy products. Remain active and exercise as tolerated. Continue to monitor. Check lipids  Morbid obesity (HCC) - BMI 41 Discussed appropriate BMI Goal of losing 1 lb per month. Diet modification. Physical  activity. Encouraged/praised to build confidence.  Vitamin D deficiency Continue to recommend supplementation for goal of 60-100 Check vitamin D level  OSA on CPAP  Reports 100% compliance, weight loss encouraged Continue to monitor  Medication management All medications discussed and reviewed in full. All questions and concerns regarding medications addressed.    Screening for heart disease EKG  Dizziness Resolved Continue to monitor  Diverticulitis Resolved Continue to remain well huydrated and remain active Recommend high fiber diet Monitor for increase in abd pain accompanied by fever, chills, N/V   Screening for colon cancer UTD with Cologard (negative) - Due 2027  Screening for hematuria or proteinuria  Check UA/Microalbumin  B12 Deficiency Monitor levels  Orders Placed This Encounter  Procedures   CBC with Differential/Platelet   COMPLETE METABOLIC PANEL WITH GFR   Magnesium   Lipid panel   TSH   Hemoglobin A1C w/out eAG   Insulin, random   VITAMIN D 25 Hydroxy (Vit-D Deficiency, Fractures)   Urinalysis, Routine w reflex microscopic   Microalbumin / creatinine urine ratio   Vitamin B12   EKG 12-Lead   Meds ordered this encounter  Medications   pantoprazole (PROTONIX) 40 MG tablet    Sig: Take 1 tablet (40 mg total) by mouth daily as needed.    Dispense:  30 tablet    Refill:  0    **Patient requests 90 days supply**    Order Specific Question:   Supervising Provider    Answer:   Lucky Cowboy [phone]   Discussed med's effects and SE's. Screening labs and tests as requested with regular follow-up as recommended.  Over 40 minutes of exam, counseling, chart review, and complex, high level critical decision making was performed this visit.   Future Appointments  Date Time Provider Department Center  09/03/2023  9:15 AM Shawnie Dapper, NP [facility] None  07/14/2024 10:00 AM Adela Glimpse, NP [facility] None  HPI  77 y.o. female  presents for  a complete physical and follow up for has Class 3 severe obesity due to excess calories with serious comorbidity and body mass index (BMI) of 40.0 to 44.9 in adult [facility]); Essential hypertension; Hyperlipemia; Hiatal hernia with GERD; Vitamin D deficiency; NASH (nonalcoholic steatohepatitis); Medication management; Macular hole; Cataract; OSA on CPAP; Other abnormal glucose (prediabetes); and Aortic atherosclerosis (HCC) on their problem list.   She is married, 1 daughter, no grandkids but has "granddogs." She is primary caregiver for husband who is blind.  Saw Audiology 11/07/22 for tinnitus, hearing loss and vertigo.  She was noted to have normal hearing in the right ear and mild sensorineural hearing loss in the left ear.  Recommend ENT f/u to view obtained audiogram and make recommendations.  Their office does not do comprehensive vestibular testing only the Dix-Hallpike testing to dx BPPV.  Did not feel that this test was warranted based upon her description of her symtpoms.   She has GERD consequent of hiatal hernia per GI, but has reported well managed with lifestyle modification and famotidine 40 mg PRN only until recently. She does take Pantoprazole PRN.  She has been seeing Dr. Kinnie Scales in the past.  Hx of hysterectomy, ovaries spared. Does follow with GYN annually.   OSA on CPAP, endorses 100% compliance and restorative sleep.   BMI is Body mass index is 39.36 kg/m., she has been working on diet and exercise. She is trying to get below 200 lb.  She walks dog daily, 30 min.   Wt Readings from Last 3 Encounters:  07/15/23 222 lb 3.2 oz (100.8 kg)  10/30/22 225 lb (102.1 kg)  10/17/22 219 lb (99.3 kg)   Aortic atherosclerosis per CT 06/2020  Her blood pressure has not been controlled at home, today their BP is BP: (!) 140/84. She reports very labile. Has been taking hyzaar, never started amlodipine, would like to minimize pills.  She does workout. She denies chest pain, shortness of breath,  dizziness, edema.   She is on cholesterol medication and denies myalgias. Her cholesterol is at goal. The cholesterol last visit was:   Lab Results  Component Value Date   CHOL 178 07/13/2022   HDL 62 07/13/2022   LDLCALC 99 07/13/2022   TRIG 83 07/13/2022   CHOLHDL 2.9 07/13/2022   She has been working on diet and exercise for prediabetes, she is on bASA, she is on ACE/ARB and denies foot ulcerations, increased appetite, nausea, paresthesia of the feet, polydipsia, polyuria, visual disturbances, vomiting and weight loss. Last A1C in the office was:  Lab Results  Component Value Date   HGBA1C 5.8 (H) 07/13/2022   Last GFR: Lab Results  Component Value Date   GFRAA 103 12/28/2020   Patient is on Vitamin D supplement.   Lab Results  Component Value Date   VD25OH 60 07/13/2022     She has fatty liver per [place] 2012 with mild stable LFT elevations monitored  Lab Results  Component Value Date   ALT 26 10/30/2022   AST 24 10/30/2022   ALKPHOS 62 05/16/2017   BILITOT 0.7 10/30/2022    Current Medications:  Current Outpatient Medications on File Prior to Visit  Medication Sig Dispense Refill   aspirin 81 MG tablet Take 81 mg by mouth daily.     Cholecalciferol (VITAMIN D) 125 MCG (5000 UT) CAPS Take by mouth.     famotidine (PEPCID) 40 MG tablet TAKE 1 TABLET(40 MG) BY MOUTH TWICE  DAILY (Patient taking differently: as needed.) 180 tablet 1   MAGNESIUM PO Take 500 mg by mouth.      pantoprazole (PROTONIX) 40 MG tablet TAKE 1 TABLET(40 MG) BY MOUTH DAILY (Patient taking differently: as needed.) 90 tablet 3   Pitavastatin Calcium (LIVALO) 4 MG TABS Take 1 tablet (4 mg total) by mouth at bedtime. 90 tablet 1   No current facility-administered medications on file prior to visit.   Allergies:  Allergies  Allergen Reactions   Anesthetics, Halogenated     Difficulty with waking up from anesthesia from THR, had aphasia, negative CT   Crestor [Rosuvastatin]    Lopid [Gemfibrozil]     Latex Rash and Swelling    [Rash] redness/swelling/rash   Medical History:  She has Class 3 severe obesity due to excess calories with serious comorbidity and body mass index (BMI) of 40.0 to 44.9 in adult [facility]); Essential hypertension; Hyperlipemia; Hiatal hernia with GERD; Vitamin D deficiency; NASH (nonalcoholic steatohepatitis); Medication management; Macular hole; Cataract; OSA on CPAP; Other abnormal glucose (prediabetes); and Aortic atherosclerosis (HCC) on their problem list. Health Maintenance:   Immunization History  Administered Date(s) Administered   DT (Pediatric) 05/10/2015   PFIZER(Purple Top)SARS-COV-2 Vaccination 01/30/2020, 02/24/2020, 08/20/2020   Td 07/27/2005   Preventative care: Colonoscopy 2017 Dr. Kinnie Scales, normal cologuard 08/2022 Negative - Due 2027 EGD 2017   Last mammogram: reports 2022 at GYN, - report requested dense breasts had diagnostic left 11/2019 at breast center negative - Due MRI breasts: 02/2018, normal, resume 3D mammograms Last pap smear/pelvic exam: 10/2021- by Melrose Nakayama - sees annually DEXA: reports in 2021, managed by OBGYN - report requested  Prior vaccinations: TD or Tdap: 2016 Influenza: declines Pneumococcal: declines Prevnar13: declines Shingles/Zostavax: declines COVID 19: 2/2 + booster -   Names of Other Physician/Practitioners you currently use: 1. [facility] here for primary care 2. Dr. Dagoberto Ligas, Dr. Leonia Corona, eye doctor, wears glasses has upcoming 2024 3. Dr. Jon Billings, Dr. De Hollingshead, dentist, last visit 2024 - sees q3 month   Patient Care Team: Lucky Cowboy, MD as PCP - General (Internal Medicine) Sharrell Ku, MD as Consulting Physician (Gastroenterology) Teodora Medici, MD as Consulting Physician (Gynecology) Durene Romans, MD as Consulting Physician (Orthopedic Surgery)  Surgical History:  She has a past surgical history that includes Abdominal hysterectomy; Eye surgery (Left, 2007);  cataract surgery (Left); right arm elbow surgery; index finger surgery (Right); Total hip arthroplasty (Left, 10/06/2013); and right 5th digit finger - cyst removal (Right, 06/04/2019). Family History:  Herfamily history includes Alzheimer's disease in an other family member; Heart disease in her mother; Kidney failure in her mother; Stroke in her father. Social History:  She reports that she has never smoked. She has never used smokeless tobacco. She reports that she does not drink alcohol and does not use drugs.  Review of Systems: Review of Systems  Constitutional:  Negative for malaise/fatigue and weight loss.  HENT:  Negative for hearing loss and tinnitus.   Eyes:  Negative for blurred vision and double vision.  Respiratory:  Negative for cough, sputum production, shortness of breath and wheezing.   Cardiovascular:  Negative for chest pain, palpitations, orthopnea, claudication, leg swelling and PND.  Gastrointestinal:  Negative for abdominal pain, blood in stool, constipation, diarrhea, heartburn, melena, nausea and vomiting.  Genitourinary: Negative.   Musculoskeletal:  Negative for falls, joint pain and myalgias.  Skin:  Negative for rash.  Neurological:  Negative for dizziness, tingling, sensory change, weakness and headaches.  Endo/Heme/Allergies:  Negative for polydipsia.  Psychiatric/Behavioral: Negative.  Negative for depression, memory loss, substance abuse and suicidal ideas. The patient is not nervous/anxious and does not have insomnia.   All other systems reviewed and are negative.   Physical Exam: Estimated body mass index is 39.36 kg/m as calculated from the following:   Height as of this encounter: 5\' 3"  (1.6 m).   Weight as of this encounter: 222 lb 3.2 oz (100.8 kg). BP (!) 140/84   Pulse 67   Temp 97.9 F (36.6 C)   Ht 5\' 3"  (1.6 m)   Wt 222 lb 3.2 oz (100.8 kg)   SpO2 98%   BMI 39.36 kg/m  General Appearance: Well nourished, in no apparent distress.   Eyes: PERRLA, EOMs, conjunctiva no swelling or erythema,  Sinuses: No Frontal/maxillary tenderness  ENT/Mouth: Ext aud canals clear, normal light reflex with TMs without erythema, bulging. Good dentition. No erythema, swelling, or exudate on post pharynx. Tonsils not swollen or erythematous. Hearing normal.  Neck: Supple, thyroid normal. No bruits  Respiratory: Respiratory effort normal, BS equal bilaterally without rales, rhonchi, wheezing or stridor.  Cardio: RRR without murmurs, rubs or gallops. Brisk peripheral pulses without edema.  Chest: symmetric, with normal excursions and percussion.  Breasts: Defer to GYN Abdomen: Soft, obese abdomen limits exam, vague tenderness not localizing, no guarding, rebound, no specific palpable hernias, masses, or organomegaly.  Lymphatics: Non tender without lymphadenopathy.  Genitourinary: Defer to GYN Musculoskeletal: Full ROM all peripheral extremities,5/5 strength, and normal gait.  Skin: Warm, dry without rashes, lesions, ecchymosis. Neuro: Cranial nerves intact, reflexes equal bilaterally. Normal muscle tone, no cerebellar symptoms. Sensation intact.  Psych: Awake and oriented X 3, normal affect, Insight and Judgment appropriate.   EKG: NSR  Adela Glimpse, NP 10:06 AM [facility]

## 2023-07-15 NOTE — Patient Instructions (Signed)

## 2023-07-17 DIAGNOSIS — M79604 Pain in right leg: Secondary | ICD-10-CM | POA: Diagnosis not present

## 2023-07-17 DIAGNOSIS — M6281 Muscle weakness (generalized): Secondary | ICD-10-CM | POA: Diagnosis not present

## 2023-08-13 DIAGNOSIS — G4733 Obstructive sleep apnea (adult) (pediatric): Secondary | ICD-10-CM | POA: Diagnosis not present

## 2023-08-29 NOTE — Patient Instructions (Signed)

## 2023-08-29 NOTE — Progress Notes (Signed)
PATIENT: Anita Blake DOB: 01/29/1946  REASON FOR VISIT: follow up HISTORY FROM: patient  Virtual Visit via Telephone Note  I connected with Anita Blake on 09/03/23 at  9:15 AM EDT by telephone and verified that I am speaking with the correct person using two identifiers.   I discussed the limitations, risks, security and privacy concerns of performing an evaluation and management service by telephone and the availability of in person appointments. I also discussed with the patient that there may be a patient responsible charge related to this service. The patient expressed understanding and agreed to proceed.   History of Present Illness:  09/03/23 ALL (mychart): Anita Blake returns for follow up for OSA on CPAP. She is doing well. She uses therapy nightly for about 8 hours, on average. She is sleeping well. She denies concerns with machine or supplies. She will be eligible for new machine next year.     08/28/2022 ALL ([facility]): Anita Blake returns for follow up for OSA on CPAP. She continues to do well. She denies concerns with machine or supplies. She uses CPAP every night when home for about 7-8 hours. She does not use CPAP when she travels. She does feel better when using CPAP.     08/22/2021 ALL (mychart): Anita Blake is a 77 y.o. female here today for follow up for OSA on CPAP. She continues to do well. She is using CPAP every night for at least 4 hours. She denies concerns with machine or supplies.     08/16/20 ALL: Anita Blake is a 77 y.o. female here today for follow up for OSA on CPAP. She reports that she is doing well. She admits that she does not take her CPAP machine when traveling. She and her husband both use CPAP and she feels that it is too much to worry with when traveling. She uses CPAP consistently when home. She denies concerns with supplies or machine. She does continue to note benefit of therapy with improved sleep quality and improvement in nocturia when using  CPAP.   Compliance report dated 07/16/2020 through 08/14/2020 reveals that she used CPAP 25 of the past 30 days for compliance of 83%.  She used CPAP greater than 4 hours 25 of the past 30 days for compliance of 83%.  Average usage on days used was 8 hours and 11 minutes.  Residual AHI was 1.8 on 5 to 12 cm of water and an EPR of 3.  There was no significant leak noted.   Observations/Objective:  Generalized: Well developed, in no acute distress  Mentation: Alert oriented to time, place, history taking. Follows all commands speech and language fluent   Assessment and Plan:  77 y.o. year old female  has a past medical history of Anemia (yrs ago), Arthritis, Complication of anesthesia, GERD (gastroesophageal reflux disease), HLD (hyperlipidemia), Hyperlipidemia, Labile hypertension, Nonalcoholic steatohepatitis (NASH), Prediabetes, and Vitamin D deficiency. here with    ICD-10-CM   1. OSA on CPAP  G47.33 For home use only DME continuous positive airway pressure (CPAP)      [place] continues to do well on CPAP therapy. Compliance report reveals excellent compliance. She was encouraged to continue using CPAP nightly and greater than 4 hours each night. She will continue healthy lifestyle habits. Supply orders updated. She will return in 1 year.    Orders Placed This Encounter  Procedures   For home use only DME continuous positive airway pressure (CPAP)    Supplies    Order  Specific Question:   Length of Need    Answer:   Lifetime    Order Specific Question:   Patient has OSA or probable OSA    Answer:   Yes    Order Specific Question:   Is the patient currently using CPAP in the home    Answer:   Yes    Order Specific Question:   Settings    Answer:   Other see comments    Order Specific Question:   CPAP supplies needed    Answer:   Mask, headgear, cushions, filters, heated tubing and water chamber     No orders of the defined types were placed in this encounter.     Follow Up  Instructions:  I discussed the assessment and treatment plan with the patient. The patient was provided an opportunity to ask questions and all were answered. The patient agreed with the plan and demonstrated an understanding of the instructions.   The patient was advised to call back or seek an in-person evaluation if the symptoms worsen or if the condition fails to improve as anticipated.  I provided 15 minutes of non-face-to-face time during this encounter. Patient located at their place of residence during Mychart visit. Provider is in the office.    Shawnie Dapper, NP

## 2023-09-02 ENCOUNTER — Telehealth: Payer: Self-pay

## 2023-09-02 NOTE — Telephone Encounter (Signed)
Pleas read MyChart message from today 09/02/2023

## 2023-09-03 ENCOUNTER — Encounter: Payer: Self-pay | Admitting: Family Medicine

## 2023-09-03 ENCOUNTER — Telehealth (INDEPENDENT_AMBULATORY_CARE_PROVIDER_SITE_OTHER): Payer: Medicare PPO | Admitting: Family Medicine

## 2023-09-03 DIAGNOSIS — G4733 Obstructive sleep apnea (adult) (pediatric): Secondary | ICD-10-CM | POA: Diagnosis not present

## 2023-10-08 LAB — HM DIABETES EYE EXAM

## 2023-10-16 ENCOUNTER — Ambulatory Visit: Payer: Medicare PPO | Admitting: Nurse Practitioner

## 2023-10-21 ENCOUNTER — Ambulatory Visit: Payer: Medicare PPO | Admitting: Nurse Practitioner

## 2023-11-06 ENCOUNTER — Encounter: Payer: Self-pay | Admitting: Internal Medicine

## 2023-12-09 ENCOUNTER — Other Ambulatory Visit: Payer: Self-pay | Admitting: Nurse Practitioner

## 2024-03-20 ENCOUNTER — Other Ambulatory Visit: Payer: Self-pay

## 2024-03-20 ENCOUNTER — Ambulatory Visit
Admission: RE | Admit: 2024-03-20 | Discharge: 2024-03-20 | Disposition: A | Discharge status: Home / Self Care | Source: Ambulatory Visit

## 2024-03-20 DIAGNOSIS — R1032 Left lower quadrant pain: Secondary | ICD-10-CM

## 2024-07-14 ENCOUNTER — Encounter: Payer: Medicare PPO | Admitting: Nurse Practitioner

## 2024-09-07 NOTE — Progress Notes (Unsigned)
 PATIENT: Anita Blake DOB: [birth year]  REASON FOR VISIT: follow up HISTORY FROM: patient  Virtual Visit via Telephone Note  I connected with Anita Blake on 09/08/24 at  9:00 AM EDT by telephone and verified that I am speaking with the correct person using two identifiers.   I discussed the limitations, risks, security and privacy concerns of performing an evaluation and management service by telephone and the availability of in person appointments. I also discussed with the patient that there may be a patient responsible charge related to this service. The patient expressed understanding and agreed to proceed.   History of Present Illness:  09/08/24 ALL (mychart): Anita Blake returns for follow up for OSA on CPAP.   She is eligible for a new CPAP machine.     09/03/2023 ALL ([facility]):  Anita Blake returns for follow up for OSA on CPAP. She is doing well. She uses therapy nightly for about 8 hours, on average. She is sleeping well. She denies concerns with machine or supplies. She will be eligible for new machine next year.     08/28/2022 ALL ([facility]ychart): Anita Blake returns for follow up for OSA on CPAP. She continues to do well. She denies concerns with machine or supplies. She uses CPAP every night when home for about 7-8 hours. She does not use CPAP when she travels. She does feel better when using CPAP.     08/22/2021 ALL (mychart): Anita Blake is a 78 y.o. female here today for follow up for OSA on CPAP. She continues to do well. She is using CPAP every night for at least 4 hours. She denies concerns with machine or supplies.     08/16/20 ALL: Anita Blake is a 78 y.o. female here today for follow up for OSA on CPAP. She reports that she is doing well. She admits that she does not take her CPAP machine when traveling. She and her husband both use CPAP and she feels that it is too much to worry with when traveling. She uses CPAP consistently when home. She denies concerns with supplies or  machine. She does continue to note benefit of therapy with improved sleep quality and improvement in nocturia when using CPAP.   Compliance report dated 07/16/2020 through 08/14/2020 reveals that she used CPAP 25 of the past 30 days for compliance of 83%.  She used CPAP greater than 4 hours 25 of the past 30 days for compliance of 83%.  Average usage on days used was 8 hours and 11 minutes.  Residual AHI was 1.8 on 5 to 12 cm of water and an EPR of 3.  There was no significant leak noted.   Observations/Objective:  Generalized: Well developed, in no acute distress  Mentation: Alert oriented to time, place, history taking. Follows all commands speech and language fluent   Assessment and Plan:  78 y.o. year old female  has a past medical history of Anemia (yrs ago), Arthritis, Complication of anesthesia, GERD (gastroesophageal reflux disease), HLD (hyperlipidemia), Hyperlipidemia, Labile hypertension, Nonalcoholic steatohepatitis (NASH), Prediabetes, and Vitamin D  deficiency. here with    ICD-10-CM   1. OSA on CPAP  G47.33        Anita Blake continues to do well on CPAP therapy. Compliance report reveals excellent compliance. She was encouraged to continue using CPAP nightly and greater than 4 hours each night. She will continue healthy lifestyle habits. Supply orders updated. She will return in 1 year.    No orders of the defined types were  placed in this encounter.    No orders of the defined types were placed in this encounter.     Follow Up Instructions:  I discussed the assessment and treatment plan with the patient. The patient was provided an opportunity to ask questions and all were answered. The patient agreed with the plan and demonstrated an understanding of the instructions.   The patient was advised to call back or seek an in-person evaluation if the symptoms worsen or if the condition fails to improve as anticipated.  I provided 15 minutes of non-face-to-face time during  this encounter. Patient located at their place of residence during Mychart visit. Provider is in the office.    [place], NP

## 2024-09-07 NOTE — Progress Notes (Unsigned)
 Anita Blake

## 2024-09-07 NOTE — Patient Instructions (Signed)

## 2024-09-08 ENCOUNTER — Encounter: Payer: Medicare PPO | Admitting: Family Medicine

## 2024-09-08 ENCOUNTER — Telehealth: Payer: Self-pay | Admitting: Family Medicine

## 2024-09-08 DIAGNOSIS — G4733 Obstructive sleep apnea (adult) (pediatric): Secondary | ICD-10-CM

## 2024-09-08 NOTE — Telephone Encounter (Signed)
 Could not connect to video. Contacted patient via mobile number and she reports being sick. I advised she call us  when she is feeling better and we will reschedule appt.

## 2024-11-04 ENCOUNTER — Other Ambulatory Visit: Payer: Self-pay | Admitting: Nurse Practitioner

## 2024-11-30 ENCOUNTER — Other Ambulatory Visit: Payer: Self-pay | Admitting: Obstetrics and Gynecology

## 2024-11-30 DIAGNOSIS — R928 Other abnormal and inconclusive findings on diagnostic imaging of breast: Secondary | ICD-10-CM

## 2024-12-02 ENCOUNTER — Inpatient Hospital Stay
Admission: RE | Admit: 2024-12-02 | Discharge: 2024-12-02 | Attending: Obstetrics and Gynecology | Admitting: Obstetrics and Gynecology

## 2024-12-02 ENCOUNTER — Other Ambulatory Visit

## 2024-12-02 DIAGNOSIS — R928 Other abnormal and inconclusive findings on diagnostic imaging of breast: Secondary | ICD-10-CM
